# Patient Record
Sex: Female | Born: 1950 | Race: White | Hispanic: No | Marital: Single | Smoking: Former smoker
Health system: Southern US, Community
[De-identification: ages and names within clinical notes are randomized; demographics above are authoritative.]

## PROBLEM LIST (undated history)

## (undated) DIAGNOSIS — M069 Rheumatoid arthritis, unspecified: Secondary | ICD-10-CM

## (undated) DIAGNOSIS — L4050 Arthropathic psoriasis, unspecified: Secondary | ICD-10-CM

## (undated) DIAGNOSIS — D649 Anemia, unspecified: Secondary | ICD-10-CM

## (undated) DIAGNOSIS — F319 Bipolar disorder, unspecified: Secondary | ICD-10-CM

## (undated) DIAGNOSIS — G2401 Drug induced subacute dyskinesia: Secondary | ICD-10-CM

## (undated) DIAGNOSIS — I10 Essential (primary) hypertension: Secondary | ICD-10-CM

## (undated) DIAGNOSIS — D1803 Hemangioma of intra-abdominal structures: Secondary | ICD-10-CM

## (undated) DIAGNOSIS — E119 Type 2 diabetes mellitus without complications: Secondary | ICD-10-CM

## (undated) DIAGNOSIS — Z972 Presence of dental prosthetic device (complete) (partial): Secondary | ICD-10-CM

## (undated) DIAGNOSIS — J45909 Unspecified asthma, uncomplicated: Secondary | ICD-10-CM

## (undated) DIAGNOSIS — R911 Solitary pulmonary nodule: Secondary | ICD-10-CM

## (undated) DIAGNOSIS — I214 Non-ST elevation (NSTEMI) myocardial infarction: Secondary | ICD-10-CM

## (undated) DIAGNOSIS — J449 Chronic obstructive pulmonary disease, unspecified: Secondary | ICD-10-CM

## (undated) DIAGNOSIS — K219 Gastro-esophageal reflux disease without esophagitis: Secondary | ICD-10-CM

## (undated) DIAGNOSIS — L409 Psoriasis, unspecified: Secondary | ICD-10-CM

## (undated) HISTORY — PX: CHOLECYSTECTOMY: SHX55

## (undated) HISTORY — DX: Gastro-esophageal reflux disease without esophagitis: K21.9

## (undated) HISTORY — DX: Psoriasis, unspecified: L40.9

## (undated) HISTORY — PX: THROAT SURGERY: SHX803

## (undated) HISTORY — PX: JOINT REPLACEMENT: SHX530

## (undated) HISTORY — PX: CATARACT EXTRACTION W/ INTRAOCULAR LENS  IMPLANT, BILATERAL: SHX1307

## (undated) HISTORY — DX: Arthropathic psoriasis, unspecified: L40.50

## (undated) HISTORY — PX: POLYPECTOMY: SHX149

## (undated) HISTORY — PX: NEUROPLASTY MAJOR NERVE: SUR682

---

## 2008-07-26 DIAGNOSIS — Z8619 Personal history of other infectious and parasitic diseases: Secondary | ICD-10-CM

## 2008-07-26 HISTORY — DX: Personal history of other infectious and parasitic diseases: Z86.19

## 2013-09-18 DIAGNOSIS — M47813 Spondylosis without myelopathy or radiculopathy, cervicothoracic region: Secondary | ICD-10-CM | POA: Insufficient documentation

## 2013-09-18 DIAGNOSIS — M542 Cervicalgia: Secondary | ICD-10-CM | POA: Insufficient documentation

## 2014-02-05 ENCOUNTER — Emergency Department: Payer: Self-pay | Admitting: Emergency Medicine

## 2014-02-21 DIAGNOSIS — E871 Hypo-osmolality and hyponatremia: Secondary | ICD-10-CM | POA: Insufficient documentation

## 2014-03-24 DIAGNOSIS — M4712 Other spondylosis with myelopathy, cervical region: Secondary | ICD-10-CM | POA: Insufficient documentation

## 2014-03-24 DIAGNOSIS — M5412 Radiculopathy, cervical region: Secondary | ICD-10-CM | POA: Insufficient documentation

## 2014-05-30 ENCOUNTER — Ambulatory Visit: Payer: Self-pay | Admitting: Family Medicine

## 2014-07-12 DIAGNOSIS — M47812 Spondylosis without myelopathy or radiculopathy, cervical region: Secondary | ICD-10-CM | POA: Insufficient documentation

## 2014-07-12 DIAGNOSIS — M4722 Other spondylosis with radiculopathy, cervical region: Secondary | ICD-10-CM | POA: Insufficient documentation

## 2014-08-06 DIAGNOSIS — M47812 Spondylosis without myelopathy or radiculopathy, cervical region: Secondary | ICD-10-CM | POA: Insufficient documentation

## 2014-09-19 DIAGNOSIS — G8929 Other chronic pain: Secondary | ICD-10-CM | POA: Insufficient documentation

## 2014-09-19 DIAGNOSIS — R1319 Other dysphagia: Secondary | ICD-10-CM | POA: Insufficient documentation

## 2014-09-19 DIAGNOSIS — R1011 Right upper quadrant pain: Secondary | ICD-10-CM

## 2014-09-19 DIAGNOSIS — D1803 Hemangioma of intra-abdominal structures: Secondary | ICD-10-CM | POA: Insufficient documentation

## 2014-09-19 DIAGNOSIS — R1310 Dysphagia, unspecified: Secondary | ICD-10-CM | POA: Insufficient documentation

## 2014-10-01 DIAGNOSIS — G5602 Carpal tunnel syndrome, left upper limb: Secondary | ICD-10-CM | POA: Insufficient documentation

## 2014-11-21 ENCOUNTER — Ambulatory Visit
Admit: 2014-11-21 | Disposition: A | Discharge status: Home / Self Care | Payer: Self-pay | Attending: Gastroenterology | Admitting: Gastroenterology

## 2014-12-18 ENCOUNTER — Emergency Department
Admission: EM | Admit: 2014-12-18 | Discharge: 2014-12-19 | Disposition: A | Discharge status: Home / Self Care | Payer: Medicaid Other | Attending: Student | Admitting: Student

## 2014-12-18 DIAGNOSIS — F319 Bipolar disorder, unspecified: Secondary | ICD-10-CM | POA: Diagnosis not present

## 2014-12-18 DIAGNOSIS — M542 Cervicalgia: Secondary | ICD-10-CM | POA: Diagnosis not present

## 2014-12-18 DIAGNOSIS — I10 Essential (primary) hypertension: Secondary | ICD-10-CM | POA: Diagnosis not present

## 2014-12-18 DIAGNOSIS — F99 Mental disorder, not otherwise specified: Secondary | ICD-10-CM | POA: Diagnosis present

## 2014-12-18 DIAGNOSIS — F3132 Bipolar disorder, current episode depressed, moderate: Secondary | ICD-10-CM

## 2014-12-18 DIAGNOSIS — G8929 Other chronic pain: Secondary | ICD-10-CM | POA: Diagnosis not present

## 2014-12-18 DIAGNOSIS — Z79899 Other long term (current) drug therapy: Secondary | ICD-10-CM | POA: Insufficient documentation

## 2014-12-18 HISTORY — DX: Essential (primary) hypertension: I10

## 2014-12-18 HISTORY — DX: Drug induced subacute dyskinesia: G24.01

## 2014-12-18 HISTORY — DX: Solitary pulmonary nodule: R91.1

## 2014-12-18 HISTORY — DX: Hemangioma of intra-abdominal structures: D18.03

## 2014-12-18 HISTORY — DX: Bipolar disorder, unspecified: F31.9

## 2014-12-18 HISTORY — DX: Rheumatoid arthritis, unspecified: M06.9

## 2014-12-18 LAB — SALICYLATE LEVEL: Salicylate Lvl: <4 mg/dL (ref 2.8–30.0)

## 2014-12-18 LAB — URINE DRUG SCREEN, QUALITATIVE (ARMC ONLY)
Amphetamines, Ur Screen: NOT DETECTED
BARBITURATES, UR SCREEN: NOT DETECTED
BENZODIAZEPINE, UR SCRN: POSITIVE — AB
CANNABINOID 50 NG, UR ~~LOC~~: NOT DETECTED
COCAINE METABOLITE, UR ~~LOC~~: NOT DETECTED
MDMA (Ecstasy)Ur Screen: NOT DETECTED
METHADONE SCREEN, URINE: NOT DETECTED
Opiate, Ur Screen: NOT DETECTED
Phencyclidine (PCP) Ur S: NOT DETECTED
Tricyclic, Ur Screen: NOT DETECTED

## 2014-12-18 LAB — COMPREHENSIVE METABOLIC PANEL
ALT: 12 U/L — ABNORMAL LOW (ref 14–54)
AST: 21 U/L (ref 15–41)
Albumin: 4.1 g/dL (ref 3.5–5.0)
Alkaline Phosphatase: 76 U/L (ref 38–126)
Anion gap: 8 (ref 5–15)
BUN: 9 mg/dL (ref 6–20)
CHLORIDE: 100 mmol/L — AB (ref 101–111)
CO2: 26 mmol/L (ref 22–32)
CREATININE: 0.79 mg/dL (ref 0.44–1.00)
Calcium: 8.9 mg/dL (ref 8.9–10.3)
GFR calc non Af Amer: >60 mL/min (ref >60)
GLUCOSE: 97 mg/dL (ref 65–99)
Potassium: 4.1 mmol/L (ref 3.5–5.1)
SODIUM: 134 mmol/L — AB (ref 135–145)
TOTAL PROTEIN: 7.4 g/dL (ref 6.5–8.1)
Total Bilirubin: 0.3 mg/dL (ref 0.3–1.2)

## 2014-12-18 LAB — ETHANOL

## 2014-12-18 LAB — CBC
HCT: 38.3 % (ref 35.0–47.0)
Hemoglobin: 12.9 g/dL (ref 12.0–16.0)
MCH: 30 pg (ref 26.0–34.0)
MCHC: 33.7 g/dL (ref 32.0–36.0)
MCV: 89 fL (ref 80.0–100.0)
Platelets: 241 10*3/uL (ref 150–440)
RBC: 4.3 MIL/uL (ref 3.80–5.20)
RDW: 12.7 % (ref 11.5–14.5)
WBC: 9.1 10*3/uL (ref 3.6–11.0)

## 2014-12-18 LAB — ACETAMINOPHEN LEVEL

## 2014-12-18 MED ORDER — NICOTINE 10 MG IN INHA
RESPIRATORY_TRACT | Status: AC
  Administered 2014-12-18: 1 via RESPIRATORY_TRACT
  Filled 2014-12-18: qty 36

## 2014-12-18 MED ORDER — MIRTAZAPINE 15 MG PO TABS
7.5000 mg | ORAL_TABLET | Freq: Every day | ORAL | Status: DC
  Administered 2014-12-18: 7.5 mg via ORAL

## 2014-12-18 MED ORDER — GABAPENTIN 300 MG PO CAPS
ORAL_CAPSULE | ORAL | Status: AC
  Administered 2014-12-18: 300 mg
  Filled 2014-12-18: qty 1

## 2014-12-18 MED ORDER — TRAZODONE HCL 100 MG PO TABS: 100.0000 mg | ORAL_TABLET | Freq: Every day | ORAL | Status: DC

## 2014-12-18 MED ORDER — TRAMADOL HCL 50 MG PO TABS
ORAL_TABLET | ORAL | Status: AC
  Filled 2014-12-18: qty 1

## 2014-12-18 MED ORDER — NICOTINE 10 MG IN INHA
1.0000 | RESPIRATORY_TRACT | Status: DC | PRN
  Administered 2014-12-18 – 2014-12-19 (×2): 1 via RESPIRATORY_TRACT

## 2014-12-18 MED ORDER — TRAZODONE HCL 100 MG PO TABS
ORAL_TABLET | ORAL | Status: AC
Start: 2014-12-18 — End: 2014-12-18
  Administered 2014-12-18: 100 mg
  Filled 2014-12-18: qty 1

## 2014-12-18 MED ORDER — MIRTAZAPINE 15 MG PO TABS
ORAL_TABLET | ORAL | Status: AC
  Filled 2014-12-18: qty 1

## 2014-12-18 MED ORDER — GABAPENTIN 300 MG PO CAPS: 300.0000 mg | ORAL_CAPSULE | Freq: Every day | ORAL | Status: DC

## 2014-12-18 MED ORDER — TRAMADOL HCL 50 MG PO TABS
50.0000 mg | ORAL_TABLET | Freq: Once | ORAL | Status: AC
  Administered 2014-12-18: 50 mg via ORAL

## 2014-12-18 NOTE — ED Notes (Signed)

## 2014-12-18 NOTE — ED Notes (Signed)

## 2014-12-18 NOTE — ED Notes (Signed)
BEHAVIORAL HEALTH ROUNDING Patient sleeping: Yes.   Patient alert and oriented: not applicable Behavior appropriate: Yes.  ; If no, describe:  Nutrition and fluids offered: Yes  Toileting and hygiene offered: Yes  Sitter present: yes Law enforcement present: Yes ODS 

## 2014-12-18 NOTE — ED Notes (Signed)
Meds given per md order

## 2014-12-18 NOTE — ED Provider Notes (Signed)
[facility] Emergency Department Provider Note  ____________________________________________  Time seen: Approximately 3:09 PM  I have reviewed the triage vital signs and the nursing notes.   HISTORY  Chief Complaint Mental Health Problem    HPI Kathryn Ramirez is a 64 y.o. female with history of bipolar disorder, rheumatoid arthritis, hypertension, chronic neck pain presents for evaluation for depression, emotional lability. She reports it this is been going on for a few days but got worse today in the setting of a verbal disagreement with her daughter. She denies any suicidal or homicidal ideation, no audio visual hallucinations. Timing is intermittent. Current severity is moderate. She reports compliance with all her medications. She denies any recent illness. No modifying factors. No acute medical complaints.   Past Medical History  Diagnosis Date  . Bipolar 1 disorder   . Tardive dyskinesia   . Rheumatoid arthritis   . Hypertension   . Lung nodule   . Liver hemangioma     There are no active problems to display for this patient.   History reviewed. No pertinent past surgical history.  Current Outpatient Rx  Name  Route  Sig  Dispense  Refill  . ARIPiprazole (ABILIFY) 5 MG tablet   Oral   Take 5 mg by mouth daily.         [name] buPROPion (WELLBUTRIN XL) 150 MG 24 hr tablet   Oral   Take 150 mg by mouth daily.         . diazepam (VALIUM) 5 MG tablet   Oral   Take 2.5 mg by mouth 2 (two) times daily as needed for anxiety.         . gabapentin (NEURONTIN) 300 MG capsule   Oral   Take 300 mg by mouth 2 (two) times daily.         . mirtazapine (REMERON) 7.5 MG tablet   Oral   Take 7.5 mg by mouth at bedtime. Pt is supposed to take 1/2 tablet, but she took 1 tablet (7.5 mg) because pill is too small to cut.         . Multiple Vitamins-Minerals (MULTIVITAMIN ADULT PO)   Oral   Take 1 tablet by mouth daily.         .  pantoprazole (PROTONIX) 40 MG tablet   Oral   Take 40 mg by mouth daily.         . traMADol (ULTRAM) 50 MG tablet   Oral   Take 50 mg by mouth 2 (two) times daily.         . traZODone (DESYREL) 50 MG tablet   Oral   Take 50-150 mg by mouth at bedtime.            Allergies Haldol and Trileptal  History reviewed. No pertinent family history.  Social History History  Substance Use Topics  . Smoking status: Never Smoker   . Smokeless tobacco: Not on file  . Alcohol Use: No    Review of Systems Constitutional: No fever/chills Eyes: No visual changes. ENT: No sore throat. Cardiovascular: Denies chest pain. Respiratory: Denies shortness of breath. Gastrointestinal: No abdominal pain.  No nausea, no vomiting.  No diarrhea.  No constipation. Genitourinary: Negative for dysuria. Musculoskeletal: Negative for back pain. Skin: Negative for rash. Neurological: Negative for headaches, focal weakness or numbness.  10-point ROS otherwise negative.  ____________________________________________   PHYSICAL EXAM:  VITAL SIGNS: ED Triage Vitals  Enc Vitals Group     BP 12/18/14  1447 119/78 mmHg     Pulse Rate 12/18/14 1447 88     Resp 12/18/14 1447 18     Temp 12/18/14 1447 98.2 F (36.8 C)     Temp Source 12/18/14 1447 Oral     SpO2 12/18/14 1447 97 %     Weight 12/18/14 1447 150 lb (68.04 kg)     Height 12/18/14 1447 5\' 3"  (1.6 m)     Head Cir --      Peak Flow --      Pain Score --      Pain Loc --      Pain Edu? --      Excl. in [place]? --     Constitutional: Alert and oriented. Well appearing and in no acute distress. Eyes: Conjunctivae are normal. EOMI. Head: Atraumatic. Nose: No congestion/rhinnorhea. Mouth/Throat: Mucous membranes are moist.  Oropharynx non-erythematous. Neck: No stridor.  Cardiovascular: Normal rate, regular rhythm. Grossly normal heart sounds.  Good peripheral circulation. Respiratory: Normal respiratory effort.  No retractions. Lungs  CTAB. Gastrointestinal: Soft and nontender. No distention. No abdominal bruits. No CVA tenderness. Genitourinary: deferred Musculoskeletal: No lower extremity tenderness nor edema.  No joint effusions. Neurologic:  Normal speech and language. No gross focal neurologic deficits are appreciated. Speech is normal. No gait instability. Skin:  Skin is warm, dry and intact. No rash noted. Psychiatric: Mood is slightly depressed and she is tearful at times, normal affect, Speech and behavior are normal.  ____________________________________________   LABS (all labs ordered are listed, but only abnormal results are displayed)  Labs Reviewed  ACETAMINOPHEN LEVEL - Abnormal; Notable for the following:    Acetaminophen (Tylenol), Serum <10 (*)    All other components within normal limits  COMPREHENSIVE METABOLIC PANEL - Abnormal; Notable for the following:    Sodium 134 (*)    Chloride 100 (*)    ALT 12 (*)    All other components within normal limits  URINE DRUG SCREEN, QUALITATIVE ([facility]) - Abnormal; Notable for the following:    Benzodiazepine, Ur Scrn POSITIVE (*)    All other components within normal limits  CBC  ETHANOL  SALICYLATE LEVEL   ____________________________________________  EKG  none ____________________________________________  RADIOLOGY  none ____________________________________________   PROCEDURES  Procedure(s) performed: None  Critical Care performed: No  ____________________________________________   INITIAL IMPRESSION / ASSESSMENT AND PLAN / ED COURSE  Pertinent labs & imaging results that were available during my care of the patient were reviewed by me and considered in my medical decision making (see chart for details).  Kathryn Ramirez is a 64 y.o. female with history of bipolar disorder, rheumatoid arthritis, hypertension, chronic neck pain presents for evaluation for depression, emotional lability. No acute medical complaints. Vital signs  stable. Afebrile. Screening labs generally unremarkable. Medically cleared. No indication for involuntary commitment. Will consult behavior health and psychiatry.  ----------------------------------------- 10:33 PM on 12/18/2014 -----------------------------------------  Labs reviewed and generally unremarkable. Medically cleared. ____________________________________________   FINAL CLINICAL IMPRESSION(S) / ED DIAGNOSES  Final diagnoses:  Bipolar 1 disorder      Joanne Gavel, MD 12/18/14 2233

## 2014-12-18 NOTE — ED Notes (Signed)
BEHAVIORAL HEALTH ROUNDING Patient sleeping: No. Patient alert and oriented: yes Behavior appropriate: Yes.  ; If no, describe:  Nutrition and fluids offered: No Toileting and hygiene offered: Yes  Sitter present: yes Law enforcement present: Yes ODS

## 2014-12-18 NOTE — ED Notes (Addendum)
Pt alert and talkative at this time. Pt ate snack provided by staff. No distress noted and pt appears calm and cooperative. Magazine features editor nearby for pt and Dietitian.

## 2014-12-18 NOTE — ED Notes (Signed)
Report received from Larry Sierras, care assumed.   Pt calm and cooperative at this time, co chronic back pain, rounder doing 15 q15 min rounds, will continue to monitor.

## 2014-12-18 NOTE — ED Notes (Signed)

## 2014-12-18 NOTE — ED Notes (Addendum)
Per daughter pt has hx of psychiatric illness. Per daughter "pt is paranoid, disorganized, and that she isn't wanted in the house". Daughter states marked decline in pt ability to take care of herself. Paranoia that no one likes her, and everyone is against her. Daughter states patient called an apartment complex (where she had been on the waiting list for almost 2 years) and told them to take her off the waiting list because she feels like they don't like her. Daughter states that "she will minimize and present well then decline".

## 2014-12-18 NOTE — ED Notes (Signed)
BEHAVIORAL HEALTH ROUNDING Patient sleeping: No. Patient alert and oriented: yes Behavior appropriate: Yes.  ; If no, describe:  Nutrition and fluids offered: Yes  Toileting and hygiene offered: Yes  Sitter present: yes Law enforcement present: Yes  

## 2014-12-18 NOTE — ED Notes (Signed)
ED BHU [place] Is the patient under IVC or is there intent for IVC: No Is the patient medically cleared: Yes.   Is there vacancy in the ED BHU: Yes.   Is the population mix appropriate for patient: Yes.   Is the patient awaiting placement in inpatient or outpatient setting: Yes.   Has the patient had a psychiatric consult: No. Survey of unit performed for contraband, proper placement and condition of furniture, tampering with fixtures in bathroom, shower, and each patient room: Yes.  ; Findings:  APPEARANCE/BEHAVIOR calm, cooperative and adequate rapport can be established NEURO ASSESSMENT Orientation: time, place and person Hallucinations: No. Speech: Normal Gait: normal RESPIRATORY ASSESSMENT Normal expansion.  Clear to auscultation.  No rales, rhonchi, or wheezing. CARDIOVASCULAR ASSESSMENT regular rate and rhythm, S1, S2 normal, no murmur, click, rub or gallop GASTROINTESTINAL ASSESSMENT soft, nontender, BS WNL, no r/g EXTREMITIES normal strength, tone, and muscle mass PLAN OF CARE Provide calm/safe environment. Vital signs assessed twice daily. ED BHU Assessment once each 12-hour shift. Collaborate with intake RN daily or as condition indicates. Assure the ED provider has rounded once each shift. Provide and encourage hygiene. Provide redirection as needed. Assess for escalating behavior; address immediately and inform ED provider.  Assess family dynamic and appropriateness for visitation as needed: Yes.  ; If necessary, describe findings:  Educate the patient/family about BHU procedures/visitation: Yes.  ; If necessary, describe findings:

## 2014-12-18 NOTE — ED Notes (Signed)
Pt states that she is living with daughter currently and that she "kinda flipped out today" because she is tired and "fed up" with cleaning the house, walking the dogs. Pt alert and oriented X4, active, cooperative, pt in NAD. RR even and unlabored, color WNL.  Pt is voluntary but states that daughter is "making me come here". Pt hx of bipolar disorder and is on medications currently.

## 2014-12-18 NOTE — ED Notes (Signed)
Kathryn Ramirez (daughter): [phone]

## 2014-12-19 DIAGNOSIS — G8929 Other chronic pain: Secondary | ICD-10-CM

## 2014-12-19 DIAGNOSIS — F3132 Bipolar disorder, current episode depressed, moderate: Secondary | ICD-10-CM

## 2014-12-19 NOTE — ED Notes (Signed)
Pt observed sitting in the dayroom in a recliner watching TV  Pt observed with no unusual behavior  Appropriate to stimulation  No verbalized needs or concerns at this time  NAD assessed  Continue to monitor

## 2014-12-19 NOTE — ED Notes (Signed)
BEHAVIORAL HEALTH ROUNDING Patient sleeping: No. Patient alert and oriented: yes Behavior appropriate: Yes.  ; If no, describe:  Nutrition and fluids offered: yes Toileting and hygiene offered: Yes  Sitter present: q15 minute observations and security camera monitoring Law enforcement present: Yes  ODS  

## 2014-12-19 NOTE — ED Notes (Signed)

## 2014-12-19 NOTE — ED Notes (Signed)

## 2014-12-19 NOTE — ED Notes (Signed)
Patient observed lying in bed with eyes closed  Even, unlabored respirations observed   NAD pt appears to be sleeping  I will continue to monitor along with every 15 minute visual observations and ongoing security camera monitoring    

## 2014-12-19 NOTE — Consult Note (Signed)
[facility] Consult   Reason for Consult:  Consult for this 64 year old woman with a history of mental illness who presented herself voluntarily to the emergency room Referring Physician:  malinda Patient Identification: Kathryn Ramirez MRN:  [id number] Principal Diagnosis: Bipolar disorder with moderate depression Diagnosis:   Patient Active Problem List   Diagnosis Date Noted  . Bipolar disorder with moderate depression [F31.32] 12/19/2014  . Chronic pain [G89.29] 12/19/2014    Total Time spent with patient: 1 hour  Subjective:   Kathryn Ramirez is a 64 y.o. female patient admitted with "I didn't have my medicine". Patient describes mild symptoms of depression but mostly situational frustration. Not currently under commitment.[name]  HPI:  Information obtained from the patient and chart. Patient states that she came here because she was frustrated with living with her daughter and son-in-law. She says she's been living there for several months. She says she moved in with an agreement that she would clean their house in exchange for being allowed to live there but now wants to get out and live on her own. She has various minor complaints about her living situation. Patient says her mood stays a little nervous and a little depressed. She denies that she's having any recent hallucinations. She says she sleeps reasonably well. Denies suicidal or homicidal ideation. Her current medication is gabapentin 300 mg twice a day Abilify 5 mg a day Valium 2.5 mg a day Protonix for reflux tramadol 50 mg twice a day trazodone 150 mg at night. He has not been drinking or using any illicit drugs. Denies that there was any violence or assault on anyone's part. Denies suicidal ideation. Seems to brought herself here to the emergency room just because she had a bit of frustration with living with her daughter.  Patient describes a long history of chronic mental illness. Has been given the  diagnosis of bipolar disorder. Has had several psychiatric hospitalizations in the past. Has had suicide attempts in the past. Used to get very paranoid says she never had hallucinations. Past medications include Trileptal lithium and Haldol.  Social history: Patient says she is living with her daughter and son-in-law. This is her closest relative. Patient evidently gets a check and feels like she would like to go live on her own. She is complaining that her daughter has not helped her with that. Doesn't seem to have a lot of other support.  Medical history: Gastric reflux and some chronic pain for which she takes the tramadol  Substance abuse history: Says she used to drink heavily but stopped drinking 2 years ago and has not relapsed. Denies any history of drug abuse.  Family history: She denies knowing of any family history of mental illness.   HPI Elements:   Quality:  Anxiety and depression. Severity:  Mild to moderate. Timing:  Recent with an exacerbation in the last few days. Duration:  Seem to be a chronic issue. Context:  Frustration with her living situation.  Past Medical History:  Past Medical History  Diagnosis Date  . Bipolar 1 disorder   . Tardive dyskinesia   . Rheumatoid arthritis   . Hypertension   . Lung nodule   . Liver hemangioma    History reviewed. No pertinent past surgical history. Family History: History reviewed. No pertinent family history. Social History:  History  Alcohol Use No     History  Drug Use Not on file    History   Social History  . Marital Status:  Single    Spouse Name: N/A  . Number of Children: N/A  . Years of Education: N/A   Social History Main Topics  . Smoking status: Never Smoker   . Smokeless tobacco: Not on file  . Alcohol Use: No  . Drug Use: Not on file  . Sexual Activity: Not on file   Other Topics Concern  . None   Social History Narrative  . None   Additional Social History:                           Allergies:   Allergies  Allergen Reactions  . Haldol [Haloperidol] Other (See Comments)    Reaction: Pt "felt like her neck was going to snap."  . Trileptal [Oxcarbazepine] Swelling    Labs:  Results for orders placed or performed during the hospital encounter of 12/18/14 (from the past 48 hour(s))  Acetaminophen level     Status: Abnormal   Collection Time: 12/18/14  2:59 PM  Result Value Ref Range   Acetaminophen (Tylenol), Serum <10 (L) 10 - 30 ug/mL    Comment:        THERAPEUTIC CONCENTRATIONS VARY SIGNIFICANTLY. A RANGE OF 10-30 ug/mL MAY BE AN EFFECTIVE CONCENTRATION FOR MANY PATIENTS. HOWEVER, SOME ARE BEST TREATED AT CONCENTRATIONS OUTSIDE THIS RANGE. ACETAMINOPHEN CONCENTRATIONS >150 ug/mL AT 4 HOURS AFTER INGESTION AND >50 ug/mL AT 12 HOURS AFTER INGESTION ARE OFTEN ASSOCIATED WITH TOXIC REACTIONS.   CBC     Status: None   Collection Time: 12/18/14  2:59 PM  Result Value Ref Range   WBC 9.1 3.6 - 11.0 K/uL   RBC 4.30 3.80 - 5.20 MIL/uL   Hemoglobin 12.9 12.0 - 16.0 g/dL   HCT 38.3 35.0 - 47.0 %   MCV 89.0 80.0 - 100.0 fL   MCH 30.0 26.0 - 34.0 pg   MCHC 33.7 32.0 - 36.0 g/dL   RDW 12.7 11.5 - 14.5 %   Platelets 241 150 - 440 K/uL  Comprehensive metabolic panel     Status: Abnormal   Collection Time: 12/18/14  2:59 PM  Result Value Ref Range   Sodium 134 (L) 135 - 145 mmol/L   Potassium 4.1 3.5 - 5.1 mmol/L   Chloride 100 (L) 101 - 111 mmol/L   CO2 26 22 - 32 mmol/L   Glucose, Bld 97 65 - 99 mg/dL   BUN 9 6 - 20 mg/dL   Creatinine, Ser 0.79 0.44 - 1.00 mg/dL   Calcium 8.9 8.9 - 10.3 mg/dL   Total Protein 7.4 6.5 - 8.1 g/dL   Albumin 4.1 3.5 - 5.0 g/dL   AST 21 15 - 41 U/L   ALT 12 (L) 14 - 54 U/L   Alkaline Phosphatase 76 38 - 126 U/L   Total Bilirubin 0.3 0.3 - 1.2 mg/dL   GFR calc non Af Amer >60 >60 mL/min   GFR calc Af Amer >60 >60 mL/min    Comment: (NOTE) The eGFR has been calculated using the CKD EPI equation. This calculation has  not been validated in all clinical situations. eGFR's persistently <60 mL/min signify possible Chronic Kidney Disease.    Anion gap 8 5 - 15  Ethanol (ETOH)     Status: None   Collection Time: 12/18/14  2:59 PM  Result Value Ref Range   Alcohol, Ethyl (B) <5 <5 mg/dL    Comment:        LOWEST DETECTABLE LIMIT FOR SERUM ALCOHOL  IS 11 mg/dL FOR MEDICAL PURPOSES ONLY   Salicylate level     Status: None   Collection Time: 12/18/14  2:59 PM  Result Value Ref Range   Salicylate Lvl <9.9 2.8 - 30.0 mg/dL  Urine Drug Screen, Qualitative [facility])     Status: Abnormal   Collection Time: 12/18/14  3:00 PM  Result Value Ref Range   Tricyclic, Ur Screen NONE DETECTED NONE DETECTED   Amphetamines, Ur Screen NONE DETECTED NONE DETECTED   MDMA (Ecstasy)Ur Screen NONE DETECTED NONE DETECTED   Cocaine Metabolite,Ur [place] NONE DETECTED NONE DETECTED   Opiate, Ur Screen NONE DETECTED NONE DETECTED   Phencyclidine (PCP) Ur S NONE DETECTED NONE DETECTED   Cannabinoid 50 Ng, Ur [place] NONE DETECTED NONE DETECTED   Barbiturates, Ur Screen NONE DETECTED NONE DETECTED   Benzodiazepine, Ur Scrn POSITIVE (A) NONE DETECTED   Methadone Scn, Ur NONE DETECTED NONE DETECTED    Comment: (NOTE) 833  Tricyclics, urine               Cutoff 1000 ng/mL 200  Amphetamines, urine             Cutoff 1000 ng/mL 300  MDMA (Ecstasy), urine           Cutoff 500 ng/mL 400  Cocaine Metabolite, urine       Cutoff 300 ng/mL 500  Opiate, urine                   Cutoff 300 ng/mL 600  Phencyclidine (PCP), urine      Cutoff 25 ng/mL 700  Cannabinoid, urine              Cutoff 50 ng/mL 800  Barbiturates, urine             Cutoff 200 ng/mL 900  Benzodiazepine, urine           Cutoff 200 ng/mL 1000 Methadone, urine                Cutoff 300 ng/mL 1100 1200 The urine drug screen provides only a preliminary, unconfirmed 1300 analytical test result and should not be used for non-medical 1400 purposes. Clinical consideration and professional  judgment should 1500 be applied to any positive drug screen result due to possible 1600 interfering substances. A more specific alternate chemical method 1700 must be used in order to obtain a confirmed analytical result.  1800 Gas chromato graphy / mass spectrometry (GC/MS) is the preferred 1900 confirmatory method.     Vitals: Blood pressure 127/84, pulse 82, temperature 98.2 F (36.8 C), temperature source Oral, resp. rate 18, height _0  (1.6 m), weight 68.04 kg (150 lb), SpO2 100 %.  Risk to Self: Suicidal Ideation: No Suicidal Intent: No Is patient at risk for suicide?: No Suicidal Plan?: No Access to Means: No What has been your use of drugs/alcohol within the last 12 months?: none How many times?: 0 Other Self Harm Risks: depressed and stressed Triggers for Past Attempts: None known Intentional Self Injurious Behavior: None Risk to Others: Homicidal Ideation: No Thoughts of Harm to Others: No Current Homicidal Intent: No Current Homicidal Plan: No Access to Homicidal Means: No Identified Victim: none History of harm to others?: No Assessment of Violence: On admission Does patient have access to weapons?: No Criminal Charges Pending?: No Does patient have a court date: No Prior Inpatient Therapy: Prior Inpatient Therapy: Yes Prior Therapy Dates: unknown Prior Therapy Facilty/Provider(s): in [place] and [place] Reason for Treatment:  depression/bipolar Prior Outpatient Therapy: Prior Outpatient Therapy: No Does patient have an ACCT team?: No Does patient have Intensive In-House Services?  : No Does patient have [facility] services? : No Does patient have P4CC services?: No  Current Facility-Administered Medications  Medication Dose Route Frequency Provider Last Rate Last Dose  . gabapentin (NEURONTIN) capsule 300 mg  300 mg Oral QHS Joanne Gavel, MD   0 mg at 12/19/14 0612  . mirtazapine (REMERON) tablet 7.5 mg  7.5 mg Oral QHS Joanne Gavel, MD   7.5 mg at  12/18/14 2347  . nicotine (NICOTROL) 10 MG inhaler 1 continuous puffing  1 continuous puffing Inhalation PRN Joanne Gavel, MD   1 continuous puffing at 12/19/14 1710  . traZODone (DESYREL) tablet 100 mg  100 mg Oral QHS Joanne Gavel, MD   0 mg at 12/19/14 2035   Current Outpatient Prescriptions  Medication Sig Dispense Refill  . ARIPiprazole (ABILIFY) 5 MG tablet Take 5 mg by mouth daily.    [name] buPROPion (WELLBUTRIN XL) 150 MG 24 hr tablet Take 150 mg by mouth daily.    . diazepam (VALIUM) 5 MG tablet Take 2.5 mg by mouth 2 (two) times daily as needed for anxiety.    . gabapentin (NEURONTIN) 300 MG capsule Take 300 mg by mouth 2 (two) times daily.    . mirtazapine (REMERON) 7.5 MG tablet Take 7.5 mg by mouth at bedtime. Pt is supposed to take 1/2 tablet, but she took 1 tablet (7.5 mg) because pill is too small to cut.    . Multiple Vitamins-Minerals (MULTIVITAMIN ADULT PO) Take 1 tablet by mouth daily.    . pantoprazole (PROTONIX) 40 MG tablet Take 40 mg by mouth daily.    . traMADol (ULTRAM) 50 MG tablet Take 50 mg by mouth 2 (two) times daily.    . traZODone (DESYREL) 50 MG tablet Take 50-150 mg by mouth at bedtime.       Musculoskeletal: Strength & Muscle Tone: within normal limits Gait & Station: normal Patient leans: N/A  Psychiatric Specialty Exam: Physical Exam  Constitutional: She appears well-developed and well-nourished.  HENT:  Head: Normocephalic and atraumatic.  Eyes: Conjunctivae are normal. Pupils are equal, round, and reactive to light.  Neck: Normal range of motion.  Cardiovascular: Normal heart sounds.   Respiratory: Effort normal.  GI: Soft.  Musculoskeletal: Normal range of motion.  Neurological: She is alert.  Skin: Skin is warm and dry.  Psychiatric: Thought content normal. Her mood appears anxious. Her speech is delayed. She is slowed. Cognition and memory are impaired. She expresses impulsivity.    Review of Systems  Constitutional: Negative.   HENT:  Negative.   Eyes: Negative.   Respiratory: Negative.   Cardiovascular: Negative.   Gastrointestinal: Negative.   Musculoskeletal: Negative.   Skin: Negative.   Neurological: Negative.   Psychiatric/Behavioral: Positive for depression. Negative for suicidal ideas, hallucinations, memory loss and substance abuse. The patient is nervous/anxious. The patient does not have insomnia.     Blood pressure 127/84, pulse 82, temperature 98.2 F (36.8 C), temperature source Oral, resp. rate 18, height _0  (1.6 m), weight 68.04 kg (150 lb), SpO2 100 %.Body mass index is 26.58 kg/(m^2).  General Appearance: Disheveled  Eye Contact::  Good  Speech:  Normal Rate  Volume:  Normal  Mood:  Dysphoric  Affect:  Congruent  Thought Process:  Loose and Tangential  Orientation:  Full (Time, Place, and Person)  Thought Content:  Negative  Suicidal  Thoughts:  No  Homicidal Thoughts:  No  Memory:  Immediate;   Good Recent;   Good Remote;   Good  Judgement:  Impaired  Insight:  Shallow  Psychomotor Activity:  Decreased  Concentration:  Fair  Recall:  [employer] of Knowledge:Fair  Language: Fair  Akathisia:  No  Handed:  Right  AIMS (if indicated):     Assets:  Communication Skills Desire for Improvement Financial Resources/Insurance Housing Social Support  ADL's:  Intact  Cognition: Impaired,  Mild  Sleep:      Medical Decision Making: Review of Psycho-Social Stressors (1), Review or order clinical lab tests (1), Discuss test with performing physician (1), Established Problem, Worsening (2), Review of Last Therapy Session (1) and Review of Medication Regimen & Side Effects (2)  Treatment Plan Summary: Plan This is 64 year old woman with a history of chronic mental illness. Possibly schizoaffective versus bipolar disorder. She presented because of some frustration with her living situation. She has outpatient psychiatric treatment in place and is seeing Dr. Chauncy Passy. Patient is not grossly  disorganized and she is not suicidal or homicidal. No indication for hospitalization. Patient does not have an involuntary commitment. Labs reviewed. Chart reviewed. Patient encouraged to follow up with her current medicine and her outpatient psychiatrist. She can be released from the emergency room.  Plan:  No evidence of imminent risk to self or others at present.   Patient does not meet criteria for psychiatric inpatient admission. Supportive therapy provided about ongoing stressors. Discussed crisis plan, support from social network, calling 911, coming to the Emergency Department, and calling Suicide Hotline. Disposition: As noted patient can be discharged once medically stable.  CLAPACS, JOHN 12/19/2014 6:01 PM

## 2014-12-19 NOTE — ED Notes (Signed)
Pt awaiting intake and psych consult   Assessment completed  Appropriate to stimulation  No verbalized needs or concerns at this time  NAD assessed  Continue to monitor

## 2014-12-19 NOTE — ED Notes (Signed)
Pt currently on the phone with her daughter   NAD observed continue to monitor

## 2014-12-19 NOTE — ED Notes (Signed)
Pt observed sitting in the chair of her room   Pt observed with no unusual behavior  Appropriate to stimulation  No verbalized needs or concerns at this time  NAD assessed  Continue to monitor

## 2014-12-19 NOTE — ED Notes (Signed)
efferdent provided   Pt to the shower

## 2014-12-19 NOTE — ED Notes (Signed)
Lunch provided   Pt reports that she has no where to go when she leaves the hospital and she would like CSW to find her a place to live Pt observed with no unusual behavior  Appropriate to stimulation    NAD assessed  Continue to monitor

## 2014-12-19 NOTE — ED Notes (Signed)
Clapacs in to consult at this time

## 2014-12-19 NOTE — BH Assessment (Signed)
Assessment Note  Kathryn Ramirez is an 64 y.o. female, who presents to the ED stating, "my daughter put me here; I want to get my own place; my daughter and her husband like her dad better than me; [facility] about helping me; they didn't call me back; my daughter told me; "you're only here until you get your mental health together; she is very controlling; she took me off Facebook; she said, "old people don't belong on Facebook." "I just want to get my own place; I want to get away from her; they just want me around for what they can get; I don't want to hurt myself; I just want my own place."   Axis I: Major Depression, Recurrent severe Axis II: Deferred Axis III:  Past Medical History  Diagnosis Date  . Bipolar 1 disorder   . Tardive dyskinesia   . Rheumatoid arthritis   . Hypertension   . Lung nodule   . Liver hemangioma    Axis IV: housing problems, other psychosocial or environmental problems and problems with primary support group Axis V: 41-50 serious symptoms  Past Medical History:  Past Medical History  Diagnosis Date  . Bipolar 1 disorder   . Tardive dyskinesia   . Rheumatoid arthritis   . Hypertension   . Lung nodule   . Liver hemangioma     History reviewed. No pertinent past surgical history.  Family History: History reviewed. No pertinent family history.  Social History:  reports that she has never smoked. She does not have any smokeless tobacco history on file. She reports that she does not drink alcohol. Her drug history is not on file.  Additional Social History:     CIWA: CIWA-Ar BP: (!) 99/59 mmHg Pulse Rate: 88 COWS:    Allergies:  Allergies  Allergen Reactions  . Haldol [Haloperidol] Other (See Comments)    Reaction: Pt "felt like her neck was going to snap."  . Trileptal [Oxcarbazepine] Swelling    Home Medications:  (Not in a hospital admission)  OB/GYN Status:  No LMP recorded. Patient is postmenopausal.  General  Assessment Data Location of Assessment: [facility] ED TTS Assessment: In system Is this a Tele or Face-to-Face Assessment?: Face-to-Face Is this an Initial Assessment or a Re-assessment for this encounter?: Initial Assessment Marital status: Single Is patient pregnant?: No Pregnancy Status: No Living Arrangements: Children Can pt return to current living arrangement?: No ("I don't want to.") Admission Status: Voluntary Is patient capable of signing voluntary admission?: Yes Referral Source: Self/Family/Friend Insurance type: Medicaid  Medical Screening Exam ([place]) Medical Exam completed: Yes  Crisis Care Plan Living Arrangements: Children Name of Psychiatrist: none Name of Therapist: none  Education Status Is patient currently in school?: No Highest grade of school patient has completed: 10th  Risk to self with the past 6 months Suicidal Ideation: No Has patient been a risk to self within the past 6 months prior to admission? : No Suicidal Intent: No Has patient had any suicidal intent within the past 6 months prior to admission? : No Is patient at risk for suicide?: No Suicidal Plan?: No Has patient had any suicidal plan within the past 6 months prior to admission? : No Access to Means: No What has been your use of drugs/alcohol within the last 12 months?: none Previous Attempts/Gestures: No How many times?: 0 Other Self Harm Risks: depressed and stressed Triggers for Past Attempts: None known Intentional Self Injurious Behavior: None Family Suicide History: No  Recent stressful life event(s): Conflict (Comment) Persecutory voices/beliefs?: No Depression: Yes Depression Symptoms: Tearfulness, Loss of interest in usual pleasures Substance abuse history and/or treatment for substance abuse?: No Suicide prevention information given to non-admitted patients: Yes  Risk to Others within the past 6 months Homicidal Ideation: No Does patient have any lifetime risk of  violence toward others beyond the six months prior to admission? : No Thoughts of Harm to Others: No Current Homicidal Intent: No Current Homicidal Plan: No Access to Homicidal Means: No Identified Victim: none History of harm to others?: No Assessment of Violence: On admission Does patient have access to weapons?: No Criminal Charges Pending?: No Does patient have a court date: No Is patient on probation?: No  Psychosis Hallucinations: None noted Delusions: None noted  Mental Status Report Appearance/Hygiene: In scrubs Eye Contact: Fair Motor Activity: Unremarkable Speech: Soft, Slow Level of Consciousness: Quiet/awake Mood: Helpless Affect: Depressed Anxiety Level: Minimal Thought Processes: Circumstantial Judgement: Partial Orientation: Person, Place, Situation Obsessive Compulsive Thoughts/Behaviors: None  Cognitive Functioning Concentration: Fair Memory: Recent Intact Insight: Fair Impulse Control: Fair Appetite: Fair Sleep: No Change Vegetative Symptoms: None  ADLScreening [facility] Assessment Services) Patient's cognitive ability adequate to safely complete daily activities?: Yes Patient able to express need for assistance with ADLs?: Yes Independently performs ADLs?: Yes (appropriate for developmental age)  Prior Inpatient Therapy Prior Inpatient Therapy: Yes Prior Therapy Dates: unknown Prior Therapy Facilty/Provider(s): in [place] and [place] Reason for Treatment: depression/bipolar  Prior Outpatient Therapy Prior Outpatient Therapy: No Does patient have an ACCT team?: No Does patient have Intensive In-House Services?  : No Does patient have [facility] services? : No Does patient have P4CC services?: No  ADL Screening (condition at time of admission) Patient's cognitive ability adequate to safely complete daily activities?: Yes Patient able to express need for assistance with ADLs?: Yes Independently performs ADLs?: Yes (appropriate for developmental  age)       Abuse/Neglect Assessment (Assessment to be complete while patient is alone) Physical Abuse: Denies Verbal Abuse: Yes, present (Comment) Sexual Abuse: Denies Exploitation of patient/patient's resources: Denies Self-Neglect: Denies Values / Beliefs Cultural Requests During Hospitalization: None Spiritual Requests During Hospitalization: None Consults Spiritual Care Consult Needed: No Social Work Consult Needed: No Regulatory affairs officer (For Healthcare) Does patient have an advance directive?: No Would patient like information on creating an advanced directive?: Yes Higher education careers adviser given    Additional Information 1:1 In Past 12 Months?: No CIRT Risk: No Elopement Risk: No Does patient have medical clearance?: Yes  Child/Adolescent Assessment Running Away Risk: Denies Bed-Wetting: Denies Destruction of Property: Denies Cruelty to Animals: Denies Stealing: Denies Rebellious/Defies Authority: Denies Satanic Involvement: Denies Science writer: Denies Problems at [employer]: Denies Gang Involvement: Denies  Disposition:  Disposition Initial Assessment Completed for this Encounter: Yes Disposition of Patient: Referred to (psych MD to see) Patient referred to: Other (Comment) (psych MD to see)  On Site Evaluation by:   Reviewed with Physician:    Maris Berger 12/19/2014 10:08 AM

## 2014-12-19 NOTE — ED Notes (Signed)
Pt is sitting in a recliner in the dayroom  Pt observed with no unusual behavior  Appropriate to stimulation  No verbalized needs or concerns at this time  NAD assessed  Continue to monitor

## 2014-12-19 NOTE — ED Notes (Signed)
Pt laying in bed no distress noted  

## 2014-12-19 NOTE — ED Provider Notes (Signed)
-----------------------------------------   7:14 PM on 12/19/2014 -----------------------------------------   BP 127/84 mmHg  Pulse 82  Temp(Src) 98.2 F (36.8 C) (Oral)  Resp 18  Ht 5\' 3"  (1.6 m)  Wt 150 lb (68.04 kg)  BMI 26.58 kg/m2  SpO2 100%  Patient, and cooperative. Has been seen by psychiatrist, Dr. Weber Cooks, and has been deemed appropriate for discharge. Is not suicidal or homicidal. We'll follow-up with his known psychiatrist at [facility].     Orbie Pyo, MD 12/19/14 [phone]

## 2014-12-19 NOTE — ED Notes (Signed)
Supper has been provided   Pt is talking on the phone with her daughter   Daughter called and wanted to speak with the nurse   Daughter informed that pt privacy must be upheld and that I can direct her call to her mother at this time    Phone provided to the pt and i explained to the pt the importance of keeping her visit here private  Pt verbalized agreement

## 2014-12-19 NOTE — ED Notes (Signed)
ED BHU [place] Is the patient under IVC or is there intent for IVC: No. Is the patient medically cleared: Yes.   Is there vacancy in the ED BHU: No. Is the population mix appropriate for patient: Yes.   Is the patient awaiting placement in inpatient or outpatient setting: No. Has the patient had a psychiatric consult: No. Survey of unit performed for contraband, proper placement and condition of furniture, tampering with fixtures in bathroom, shower, and each patient room: Yes.  ; Findings:  APPEARANCE/BEHAVIOR Calm and cooperative NEURO ASSESSMENT Orientation:  Oriented x3 Hallucinations: No.None noted (Hallucinations) Speech: Normal Gait: normal RESPIRATORY ASSESSMENT Even  unlabored respirations noted  CARDIOVASCULAR ASSESSMENT Regular rate  Pulses equal  Skin warm and dry   GASTROINTESTINAL ASSESSMENT no GI complaint EXTREMITIES Full ROM  PLAN OF CARE Provide calm/safe environment. Vital signs assessed twice daily. ED BHU Assessment once each 12-hour shift. Collaborate with intake RN daily or as condition indicates. Assure the ED provider has rounded once each shift. Provide and encourage hygiene. Provide redirection as needed. Assess for escalating behavior; address immediately and inform ED provider.  Assess family dynamic and appropriateness for visitation as needed: Yes.  ; If necessary, describe findings:  Educate the patient/family about BHU procedures/visitation: Yes.  ; If necessary, describe findings:

## 2014-12-19 NOTE — ED Notes (Signed)
Report received from [facility]. Pt. Sleeping, respirations even and unlabored.  Will continue to monitor for safety via security cameras and Q 15 minute checks.

## 2014-12-19 NOTE — ED Notes (Signed)
VOL/ Consult complete/Pending Disposition

## 2014-12-19 NOTE — ED Notes (Signed)
Breakfast provided    Pt sitting up in bed  NAD observed  Continue to monitor

## 2014-12-19 NOTE — Discharge Instructions (Signed)

## 2014-12-19 NOTE — ED Notes (Signed)
Patient observed with no unusual behavior or acute distress. Patient with no verbalized needs or c/o at this time.... will continue to monitor and follow up as needed. Security staff monitoring patient on [employer] system.  

## 2014-12-19 NOTE — ED Provider Notes (Signed)
-----------------------------------------   7:16 AM on 12/19/2014 -----------------------------------------   BP 99/59 mmHg  Pulse 88  Temp(Src) 98.2 F (36.8 C) (Oral)  Resp 18  Ht 5\' 3"  (1.6 m)  Wt 150 lb (68.04 kg)  BMI 26.58 kg/m2  SpO2 95%  The patient had no acute events since last update.  Calm and cooperative at this time.  Disposition is pending per Psychiatry/Behavioral Medicine team recommendations. Will watch BP-this one done during rest period    Nena Polio, MD 12/19/14 [phone]

## 2015-05-25 ENCOUNTER — Emergency Department
Admission: EM | Admit: 2015-05-25 | Discharge: 2015-05-25 | Disposition: A | Discharge status: Home / Self Care | Payer: Medicaid Other | Attending: Emergency Medicine | Admitting: Emergency Medicine

## 2015-05-25 ENCOUNTER — Encounter: Payer: Self-pay | Admitting: Emergency Medicine

## 2015-05-25 DIAGNOSIS — M25561 Pain in right knee: Secondary | ICD-10-CM | POA: Diagnosis present

## 2015-05-25 DIAGNOSIS — M13161 Monoarthritis, not elsewhere classified, right knee: Secondary | ICD-10-CM

## 2015-05-25 DIAGNOSIS — M1711 Unilateral primary osteoarthritis, right knee: Secondary | ICD-10-CM | POA: Insufficient documentation

## 2015-05-25 DIAGNOSIS — I10 Essential (primary) hypertension: Secondary | ICD-10-CM | POA: Insufficient documentation

## 2015-05-25 DIAGNOSIS — Z79899 Other long term (current) drug therapy: Secondary | ICD-10-CM | POA: Diagnosis not present

## 2015-05-25 MED ORDER — OXYCODONE-ACETAMINOPHEN 5-325 MG PO TABS: 1.0000 | ORAL_TABLET | Freq: Every evening | ORAL | Status: DC | PRN

## 2015-05-25 MED ORDER — PREDNISONE 10 MG PO TABS: 10.0000 mg | ORAL_TABLET | ORAL | Status: DC

## 2015-05-25 NOTE — ED Provider Notes (Signed)
[facility] Emergency Department Provider Note  ____________________________________________  Time seen: Approximately 12:34 PM  I have reviewed the triage vital signs and the nursing notes.   HISTORY  Chief Complaint Knee Pain    HPI Kathryn Ramirez is a 64 y.o. female who presents to emergency department complaining of right knee pain.She states that she has sciatica in right leg from a known lumbar spinal issue. Ever, she states that this is "not related to my normal sciatica." That she has noticed her right knee is slightly "swollen." She states that the pain has been present for 3 days, it is constant, worse with movement and weightbearing, and is described as sharp. Eyes any injury precipitating this pain. His any overuse of same.   Past Medical History  Diagnosis Date  . Bipolar 1 disorder ([place])   . Tardive dyskinesia   . Rheumatoid arthritis ([place])   . Hypertension   . Lung nodule   . Liver hemangioma     Patient Active Problem List   Diagnosis Date Noted  . Bipolar disorder with moderate depression ([place]) 12/19/2014  . Chronic pain 12/19/2014    History reviewed. No pertinent past surgical history.  Current Outpatient Rx  Name  Route  Sig  Dispense  Refill  . ARIPiprazole (ABILIFY) 5 MG tablet   Oral   Take 5 mg by mouth daily.         [name] buPROPion (WELLBUTRIN XL) 150 MG 24 hr tablet   Oral   Take 150 mg by mouth daily.         . diazepam (VALIUM) 5 MG tablet   Oral   Take 2.5 mg by mouth 2 (two) times daily as needed for anxiety.         . gabapentin (NEURONTIN) 300 MG capsule   Oral   Take 300 mg by mouth 2 (two) times daily.         . mirtazapine (REMERON) 7.5 MG tablet   Oral   Take 7.5 mg by mouth at bedtime. Pt is supposed to take 1/2 tablet, but she took 1 tablet (7.5 mg) because pill is too small to cut.         . Multiple Vitamins-Minerals (MULTIVITAMIN ADULT PO)   Oral   Take 1 tablet by mouth daily.        . pantoprazole (PROTONIX) 40 MG tablet   Oral   Take 40 mg by mouth daily.         . predniSONE (DELTASONE) 10 MG tablet   Oral   Take 1 tablet (10 mg total) by mouth as directed.   21 tablet   0     Take on a daily basis of 6, 5, 4, 3, 2, 1   . traMADol (ULTRAM) 50 MG tablet   Oral   Take 50 mg by mouth 2 (two) times daily.         . traZODone (DESYREL) 50 MG tablet   Oral   Take 50-150 mg by mouth at bedtime.            Allergies Haldol and Trileptal  No family history on file.  Social History Social History  Substance Use Topics  . Smoking status: Never Smoker   . Smokeless tobacco: None  . Alcohol Use: No    Review of Systems Constitutional: No fever/chills Eyes: No visual changes. ENT: No sore throat. Cardiovascular: Denies chest pain. Respiratory: Denies shortness of breath. Gastrointestinal: No abdominal pain.  No  nausea, no vomiting.  No diarrhea.  No constipation. Genitourinary: Negative for dysuria. Musculoskeletal: Negative for back pain. Nurses right knee pain Skin: Negative for rash. Neurological: Negative for headaches, focal weakness or numbness.  10-point ROS otherwise negative.  ____________________________________________   PHYSICAL EXAM:  VITAL SIGNS: ED Triage Vitals  Enc Vitals Group     BP 05/25/15 1204 171/90 mmHg     Pulse Rate 05/25/15 1204 78     Resp 05/25/15 1204 18     Temp 05/25/15 1204 98.2 F (36.8 C)     Temp Source 05/25/15 1204 Oral     SpO2 05/25/15 1204 96 %     Weight 05/25/15 1204 175 lb (79.379 kg)     Height 05/25/15 1204 5\' 3"  (1.6 m)     Head Cir --      Peak Flow --      Pain Score 05/25/15 1211 8     Pain Loc --      Pain Edu? --      Excl. in [place]? --     Constitutional: Alert and oriented. Well appearing and in no acute distress. Eyes: Conjunctivae are normal. PERRL. EOMI. Head: Atraumatic. Nose: No congestion/rhinnorhea. Mouth/Throat: Mucous membranes are moist.  Oropharynx  non-erythematous. Neck: No stridor.   Cardiovascular: Normal rate, regular rhythm. Grossly normal heart sounds.  Good peripheral circulation. Respiratory: Normal respiratory effort.  No retractions. Lungs CTAB. Gastrointestinal: Soft and nontender. No distention. No abdominal bruits. No CVA tenderness. Musculoskeletal: Mildly edematous right knee when compared with the left. No visible deformity. No ecchymosis or contusion. She is diffusely tender to palpation over the knee with no point tenderness. Varus and valgus, Lachman's, anterior drawer are all negative.  No joint effusions. Neurologic:  Normal speech and language. No gross focal neurologic deficits are appreciated. No gait instability. Skin:  Skin is warm, dry and intact. No rash noted. Psychiatric: Mood and affect are normal. Speech and behavior are normal.  ____________________________________________   LABS (all labs ordered are listed, but only abnormal results are displayed)  Labs Reviewed - No data to display ____________________________________________  EKG   ____________________________________________  RADIOLOGY   ____________________________________________   PROCEDURES  Procedure(s) performed: None  Critical Care performed: No  ____________________________________________   INITIAL IMPRESSION / ASSESSMENT AND PLAN / ED COURSE  Pertinent labs & imaging results that were available during my care of the patient were reviewed by me and considered in my medical decision making (see chart for details).  Patient's history, symptoms, physical exam are consistent with inflammation to right knee. No signs of infection. No ballottement or effusion. I am not concerned for bony abnormality to include fracture or dislocation. I'll special tests were negative and no concern at this time for tendon or ligament damage. Advised the patient of findings and diagnosis and she verbalizes understanding of same. I advised her  that her symptoms are likely secondary to change in ambulation due to her lower back pain. The patient has a history of GI erosion I'll place the patient on a short prednisone taper for symptomatic control. I advised the patient to pick up a neoprene knee brace and to wear that for additional support and symptomatic control. The patient verbalizes understanding of diagnosis and she verbalizes understanding of treatment plan. She verbalizes compliance with same. I advised the patient that she should follow-up with orthopedics if symptoms persist for further evaluation and treatment. ____________________________________________   FINAL CLINICAL IMPRESSION(S) / ED DIAGNOSES  Final diagnoses:  Inflammation of  joint of knee, right      Darletta Moll, PA-C 05/25/15 1252  Delman Kitten, MD 05/25/15 [phone]

## 2015-05-25 NOTE — Discharge Instructions (Signed)

## 2015-05-25 NOTE — ED Notes (Signed)
Right knee with no obvious swelling. No redness noted.

## 2015-05-25 NOTE — ED Notes (Signed)
Pt reports right knee pain and swelling for past 3 days. Denies injury.

## 2015-07-02 ENCOUNTER — Encounter: Payer: Self-pay | Admitting: Family Medicine

## 2015-07-02 ENCOUNTER — Ambulatory Visit (INDEPENDENT_AMBULATORY_CARE_PROVIDER_SITE_OTHER): Payer: Medicaid Other | Admitting: Family Medicine

## 2015-07-02 VITALS — BP 150/88 | HR 83 | Temp 98.2°F | Resp 17 | Ht 63.0 in | Wt 180.3 lb

## 2015-07-02 DIAGNOSIS — M7121 Synovial cyst of popliteal space [Baker], right knee: Secondary | ICD-10-CM

## 2015-07-02 DIAGNOSIS — M25561 Pain in right knee: Secondary | ICD-10-CM | POA: Diagnosis not present

## 2015-07-02 NOTE — Progress Notes (Signed)
Name: Kathryn Ramirez   MRN: [id number]    DOB: [birth year]   Date:07/02/2015       Progress Note  Subjective  Chief Complaint  Chief Complaint  Patient presents with  . Establish Care    NP  . Manic Behavior   Knee Pain  There was no injury mechanism. The pain is present in the right knee. The quality of the pain is described as shooting (sharp pain when walking). The pain is at a severity of 9/10. The pain is severe. The pain has been fluctuating (worse with walking and weight bearing, better when laying down with two pillows underneath her knee) since onset. Pertinent negatives include no inability to bear weight, loss of motion, loss of sensation, numbness or tingling. She has tried immobilization for the symptoms. The treatment provided moderate relief.   patient reportedly has a history of Baker's cyst in the right kidney.  Pt. Is here to establish care. Last PCP Dr. Iona Beard in [facility] with [facility].   Past Medical History  Diagnosis Date  . Bipolar 1 disorder ([place])   . Tardive dyskinesia   . Rheumatoid arthritis ([place])   . Hypertension   . Lung nodule   . Liver hemangioma   . Acid reflux disease   . Psoriasis     Past Surgical History  Procedure Laterality Date  . Neuroplasty major nerve    . Polypectomy    . Throat surgery      Family History  Problem Relation Age of Onset  . Cancer Mother     Esophageal Ca  . Asthma Brother     Social History   Social History  . Marital Status: Single    Spouse Name: N/A  . Number of Children: N/A  . Years of Education: N/A   Occupational History  . Not on file.   Social History Main Topics  . Smoking status: Former Smoker    Types: Cigarettes    Quit date: 12/31/2014  . Smokeless tobacco: Never Used  . Alcohol Use: No     Comment: 03/31/2012 sobierty   . Drug Use: No  . Sexual Activity: No   Other Topics Concern  . Not on file   Social History Narrative     Current outpatient prescriptions:  .   ARIPiprazole (ABILIFY) 5 MG tablet, Take 5 mg by mouth daily., Disp: , Rfl:  .  buPROPion (WELLBUTRIN XL) 150 MG 24 hr tablet, Take 150 mg by mouth daily., Disp: , Rfl:  .  calcipotriene (DOVONOX) 0.005 % ointment, Apply topically., Disp: , Rfl:  .  diazepam (VALIUM) 5 MG tablet, Take 2.5 mg by mouth 2 (two) times daily as needed for anxiety., Disp: , Rfl:  .  gabapentin (NEURONTIN) 300 MG capsule, Take 300 mg by mouth 2 (two) times daily., Disp: , Rfl:  .  mirtazapine (REMERON) 7.5 MG tablet, Take 7.5 mg by mouth at bedtime. Pt is supposed to take 1/2 tablet, but she took 1 tablet (7.5 mg) because pill is too small to cut., Disp: , Rfl:  .  pantoprazole (PROTONIX) 40 MG tablet, Take 40 mg by mouth daily., Disp: , Rfl:  .  solifenacin (VESICARE) 10 MG tablet, Take by mouth., Disp: , Rfl:  .  traMADol (ULTRAM) 50 MG tablet, Take 50 mg by mouth 2 (two) times daily., Disp: , Rfl:  .  traZODone (DESYREL) 50 MG tablet, Take 50-150 mg by mouth at bedtime. , Disp: , Rfl:   Allergies  Allergen Reactions  . Codeine Nausea Only  . Haldol [Haloperidol] Other (See Comments)    Reaction: Pt "felt like her neck was going to snap."  . Haloperidol Lactate Other (See Comments)    Sever neck stiffness.  . Trileptal [Oxcarbazepine] Swelling    Review of Systems  Constitutional: Negative for fever and chills.  Musculoskeletal: Positive for joint pain.  Neurological: Negative for tingling and numbness.   Objective  Filed Vitals:   07/02/15 1541  BP: 150/88  Pulse: 83  Temp: 98.2 F (36.8 C)  TempSrc: Oral  Resp: 17  Height: _0  (1.6 m)  Weight: 180 lb 4.8 oz (81.784 kg)  SpO2: 83%    Physical Exam  Musculoskeletal:       Right knee: She exhibits swelling. She exhibits normal range of motion, no effusion and no erythema. Tenderness found. Patellar tendon tenderness noted.  Tenderness to palpation over the right knee supra-patellar area, normal ROM, knee is warm to touch, no erythema.   Nursing note and vitals reviewed.    Assessment & Plan  1. Knee pain, right Obtain laboratory and x-ray evaluation for possible causes of right knee pain including infections, inflammatory and degenerative disease. - CBC with Differential - Sed Rate (ESR) - Comprehensive Metabolic Panel (CMET) - DG Knee Complete 4 Views Right; Future  2. Baker's cyst of knee, right Referral to orthopedics for follow-up of Baker's cyst. - Ambulatory referral to Orthopedic Surgery    Khristi Schiller [facility] 07/02/2015 4:24 PM

## 2015-07-03 ENCOUNTER — Ambulatory Visit
Admission: RE | Admit: 2015-07-03 | Discharge: 2015-07-03 | Disposition: A | Discharge status: Home / Self Care | Payer: Medicaid Other | Source: Ambulatory Visit | Attending: Family Medicine | Admitting: Family Medicine

## 2015-07-03 DIAGNOSIS — M25461 Effusion, right knee: Secondary | ICD-10-CM | POA: Insufficient documentation

## 2015-07-03 DIAGNOSIS — M25561 Pain in right knee: Secondary | ICD-10-CM | POA: Insufficient documentation

## 2015-07-04 LAB — CBC WITH DIFFERENTIAL/PLATELET
BASOS ABS: 0 10*3/uL (ref 0.0–0.2)
Basos: 1 %
EOS (ABSOLUTE): 0.3 10*3/uL (ref 0.0–0.4)
Eos: 4 %
HEMOGLOBIN: 13.2 g/dL (ref 11.1–15.9)
Hematocrit: 38.9 % (ref 34.0–46.6)
Immature Grans (Abs): 0 10*3/uL (ref 0.0–0.1)
Immature Granulocytes: 0 %
LYMPHS ABS: 2.3 10*3/uL (ref 0.7–3.1)
Lymphs: 37 %
MCH: 28.8 pg (ref 26.6–33.0)
MCHC: 33.9 g/dL (ref 31.5–35.7)
MCV: 85 fL (ref 79–97)
MONOCYTES: 6 %
MONOS ABS: 0.4 10*3/uL (ref 0.1–0.9)
Neutrophils Absolute: 3.3 10*3/uL (ref 1.4–7.0)
Neutrophils: 52 %
Platelets: 345 10*3/uL (ref 150–379)
RBC: 4.59 x10E6/uL (ref 3.77–5.28)
RDW: 13.1 % (ref 12.3–15.4)
WBC: 6.4 10*3/uL (ref 3.4–10.8)

## 2015-07-04 LAB — COMPREHENSIVE METABOLIC PANEL
ALK PHOS: 82 IU/L (ref 39–117)
ALT: 19 IU/L (ref 0–32)
AST: 18 IU/L (ref 0–40)
Albumin/Globulin Ratio: 1.6 (ref 1.1–2.5)
Albumin: 4.4 g/dL (ref 3.6–4.8)
BUN/Creatinine Ratio: 10 — ABNORMAL LOW (ref 11–26)
BUN: 8 mg/dL (ref 8–27)
CHLORIDE: 95 mmol/L — AB (ref 97–106)
CO2: 26 mmol/L (ref 18–29)
Calcium: 9.5 mg/dL (ref 8.7–10.3)
Creatinine, Ser: 0.81 mg/dL (ref 0.57–1.00)
GFR calc Af Amer: 90 mL/min/{1.73_m2} (ref >59)
GFR calc non Af Amer: 78 mL/min/{1.73_m2} (ref >59)
GLUCOSE: 101 mg/dL — AB (ref 65–99)
Globulin, Total: 2.7 g/dL (ref 1.5–4.5)
Potassium: 5.1 mmol/L (ref 3.5–5.2)
Sodium: 136 mmol/L (ref 136–144)
Total Protein: 7.1 g/dL (ref 6.0–8.5)

## 2015-07-04 LAB — SEDIMENTATION RATE: SED RATE: 6 mm/h (ref 0–40)

## 2015-07-07 ENCOUNTER — Telehealth: Payer: Self-pay | Admitting: Family Medicine

## 2015-07-07 NOTE — Telephone Encounter (Signed)
Spoke with patient and she has been notified that her referral has been ordered and she will be contacted to schedule appointment

## 2015-07-07 NOTE — Telephone Encounter (Signed)
Pt would like a call back about her referral.

## 2015-07-10 ENCOUNTER — Telehealth: Payer: Self-pay | Admitting: Family Medicine

## 2015-07-10 NOTE — Telephone Encounter (Signed)
Patient had lab work and xrays last week. She is checking status on the results. Please return call [phone]

## 2015-07-11 NOTE — Telephone Encounter (Signed)
Results have been reported to patient 

## 2015-07-12 ENCOUNTER — Other Ambulatory Visit: Payer: Self-pay | Admitting: Family Medicine

## 2015-07-12 DIAGNOSIS — M25461 Effusion, right knee: Secondary | ICD-10-CM

## 2015-07-27 HISTORY — PX: CERVICAL SPINE SURGERY: SHX589

## 2015-07-31 ENCOUNTER — Encounter: Payer: Self-pay | Admitting: Gynecology

## 2015-07-31 ENCOUNTER — Ambulatory Visit: Payer: Medicaid Other

## 2015-07-31 ENCOUNTER — Ambulatory Visit
Admission: EM | Admit: 2015-07-31 | Discharge: 2015-07-31 | Disposition: A | Discharge status: Home / Self Care | Payer: Medicaid Other | Attending: Family Medicine | Admitting: Family Medicine

## 2015-07-31 DIAGNOSIS — R51 Headache: Secondary | ICD-10-CM | POA: Diagnosis not present

## 2015-07-31 DIAGNOSIS — J189 Pneumonia, unspecified organism: Secondary | ICD-10-CM | POA: Insufficient documentation

## 2015-07-31 DIAGNOSIS — R938 Abnormal findings on diagnostic imaging of other specified body structures: Secondary | ICD-10-CM

## 2015-07-31 DIAGNOSIS — R9389 Abnormal findings on diagnostic imaging of other specified body structures: Secondary | ICD-10-CM

## 2015-07-31 DIAGNOSIS — R918 Other nonspecific abnormal finding of lung field: Secondary | ICD-10-CM | POA: Diagnosis not present

## 2015-07-31 DIAGNOSIS — R509 Fever, unspecified: Secondary | ICD-10-CM | POA: Insufficient documentation

## 2015-07-31 LAB — URINALYSIS COMPLETE WITH MICROSCOPIC (ARMC ONLY)
Bilirubin Urine: NEGATIVE
Glucose, UA: NEGATIVE mg/dL
KETONES UR: NEGATIVE mg/dL
NITRITE: NEGATIVE
PH: 7 (ref 5.0–8.0)
PROTEIN: NEGATIVE mg/dL
Specific Gravity, Urine: 1.02 (ref 1.005–1.030)

## 2015-07-31 LAB — BASIC METABOLIC PANEL
ANION GAP: 5 (ref 5–15)
BUN: 13 mg/dL (ref 6–20)
CHLORIDE: 101 mmol/L (ref 101–111)
CO2: 30 mmol/L (ref 22–32)
Calcium: 9.1 mg/dL (ref 8.9–10.3)
Creatinine, Ser: 0.76 mg/dL (ref 0.44–1.00)
GFR calc Af Amer: >60 mL/min (ref >60)
GFR calc non Af Amer: >60 mL/min (ref >60)
GLUCOSE: 81 mg/dL (ref 65–99)
POTASSIUM: 4.2 mmol/L (ref 3.5–5.1)
SODIUM: 136 mmol/L (ref 135–145)

## 2015-07-31 LAB — CBC WITH DIFFERENTIAL/PLATELET
BASOS ABS: 0.1 10*3/uL (ref 0–0.1)
BASOS PCT: 1 %
Eosinophils Absolute: 0.4 10*3/uL (ref 0–0.7)
Eosinophils Relative: 5 %
HEMATOCRIT: 38.9 % (ref 35.0–47.0)
HEMOGLOBIN: 12.8 g/dL (ref 12.0–16.0)
LYMPHS PCT: 42 %
Lymphs Abs: 3.4 10*3/uL (ref 1.0–3.6)
MCH: 28.1 pg (ref 26.0–34.0)
MCHC: 32.8 g/dL (ref 32.0–36.0)
MCV: 85.7 fL (ref 80.0–100.0)
MONO ABS: 0.6 10*3/uL (ref 0.2–0.9)
MONOS PCT: 7 %
NEUTROS ABS: 3.7 10*3/uL (ref 1.4–6.5)
NEUTROS PCT: 45 %
Platelets: 266 10*3/uL (ref 150–440)
RBC: 4.54 MIL/uL (ref 3.80–5.20)
RDW: 13.7 % (ref 11.5–14.5)
WBC: 8.2 10*3/uL (ref 3.6–11.0)

## 2015-07-31 LAB — RAPID STREP SCREEN (MED CTR MEBANE ONLY): Streptococcus, Group A Screen (Direct): NEGATIVE

## 2015-07-31 LAB — SEDIMENTATION RATE: Sed Rate: 11 mm/hr (ref 0–30)

## 2015-07-31 MED ORDER — LEVOFLOXACIN 500 MG PO TABS: 500.0000 mg | ORAL_TABLET | Freq: Every day | ORAL | Status: DC

## 2015-07-31 NOTE — ED Notes (Signed)
Patient c/o low grade fever on and off with head ace x 3 week. Per patient fever 99-100.

## 2015-07-31 NOTE — Discharge Instructions (Signed)

## 2015-07-31 NOTE — ED Provider Notes (Signed)
CSN: [id number]     Arrival date & time 07/31/15  1542 History   First MD Initiated Contact with Patient 07/31/15 1846    Nurses notes were reviewed. Chief Complaint  Patient presents with  . Fever   Patient is here because for fever off and on for the last 3 weeks and often with fever she has a headache. She denies any other symptoms. She does have a history of rheumatoid arthritis she had a sedimentation rate first week of December which was negative. She also has a history of pulmonary nodules she's been followed at [facility] which is not new go back to taking it because of difficulty obtaining a ride. And arranging the discharge paperwork. She denies any type of cough and chest congestion burning on urination frequency chest pain or postnasal drainage. Even though she has pulmonary nodules she's not had a cough either. She states the headaches will babble and they do occur she had which initially got here but several hours later is now gone  He had a CT of her head MRI of her head about 2 years ago and they were negative for any lesions. She has a history of rheumatoid arthritis reflux hypertension and bipolar disease.  She is a former smoker. Mother had esophageal cancer. Brother has history of asthma. She did stop smoking in June 2016  (Consider location/radiation/quality/duration/timing/severity/associated sxs/prior Treatment) HPI  Past Medical History  Diagnosis Date  . Bipolar 1 disorder ([place])   . Tardive dyskinesia   . Rheumatoid arthritis ([place])   . Hypertension   . Lung nodule   . Liver hemangioma   . Acid reflux disease   . Psoriasis    Past Surgical History  Procedure Laterality Date  . Neuroplasty major nerve    . Polypectomy    . Throat surgery     Family History  Problem Relation Age of Onset  . Cancer Mother     Esophageal Ca  . Asthma Brother    Social History  Substance Use Topics  . Smoking status: Former Smoker    Types: Cigarettes    Quit date: 12/31/2014   . Smokeless tobacco: Never Used  . Alcohol Use: No     Comment: 03/31/2012 sobierty    OB History    No data available     Review of Systems  Constitutional: Positive for fever and fatigue.  HENT: Negative for drooling, ear discharge, ear pain, hearing loss and postnasal drip.   Eyes: Negative for pain and redness.  Respiratory: Negative for cough, chest tightness, shortness of breath and stridor.   Cardiovascular: Negative for chest pain.  Genitourinary: Negative for dysuria, urgency, hematuria and difficulty urinating.  Musculoskeletal: Positive for arthralgias and gait problem. Negative for myalgias.  Skin: Negative for rash.  Neurological: Positive for headaches. Negative for tremors and speech difficulty.  All other systems reviewed and are negative.   Allergies  Codeine; Haldol; Haloperidol lactate; and Trileptal  Home Medications   Prior to Admission medications   Medication Sig Start Date End Date Taking? Authorizing Provider  ARIPiprazole (ABILIFY) 5 MG tablet Take 5 mg by mouth daily.   Yes Historical Provider, MD  buPROPion (WELLBUTRIN XL) 150 MG 24 hr tablet Take 150 mg by mouth daily.   Yes Historical Provider, MD  calcipotriene (DOVONOX) 0.005 % ointment Apply topically. 02/18/15 02/18/16 Yes Historical Provider, MD  diazepam (VALIUM) 5 MG tablet Take 2.5 mg by mouth 2 (two) times daily as needed for anxiety.   Yes Historical  Provider, MD  gabapentin (NEURONTIN) 300 MG capsule Take 300 mg by mouth 2 (two) times daily.   Yes Historical Provider, MD  mirtazapine (REMERON) 7.5 MG tablet Take 7.5 mg by mouth at bedtime. Pt is supposed to take 1/2 tablet, but she took 1 tablet (7.5 mg) because pill is too small to cut.   Yes Historical Provider, MD  pantoprazole (PROTONIX) 40 MG tablet Take 40 mg by mouth daily.   Yes Historical Provider, MD  solifenacin (VESICARE) 10 MG tablet Take by mouth. 08/15/14 08/16/15 Yes Historical Provider, MD  traMADol (ULTRAM) 50 MG tablet Take  50 mg by mouth 2 (two) times daily.   Yes Historical Provider, MD  traZODone (DESYREL) 50 MG tablet Take 50-150 mg by mouth at bedtime.    Yes Historical Provider, MD  levofloxacin (LEVAQUIN) 500 MG tablet Take 1 tablet (500 mg total) by mouth daily. 07/31/15   Frederich Cha, MD   Meds Ordered and Administered this Visit  Medications - No data to display  BP 154/88 mmHg  Pulse 77  Temp(Src) 98 F (36.7 C) (Oral)  Resp 16  Ht 5\' 3"  (1.6 m)  Wt 180 lb (81.647 kg)  BMI 31.89 kg/m2  SpO2 98% No data found.   Physical Exam  Constitutional: She is oriented to person, place, and time. She appears well-developed and well-nourished.  HENT:  Head: Normocephalic and atraumatic.  Right Ear: Hearing and tympanic membrane normal.  Left Ear: Hearing and tympanic membrane normal.  Nose: Nose normal.  Mouth/Throat: She does not have dentures. No dental caries. Posterior oropharyngeal erythema present.  Eyes: Conjunctivae are normal. Pupils are equal, round, and reactive to light.  Neck: Neck supple.  Cardiovascular: Normal rate, regular rhythm and normal heart sounds.   Pulmonary/Chest: Effort normal and breath sounds normal. No respiratory distress. She has no wheezes.  Musculoskeletal: Normal range of motion. She exhibits no edema.  Lymphadenopathy:    She has cervical adenopathy.  Neurological: She is alert and oriented to person, place, and time. No cranial nerve deficit.  Skin: Skin is warm and dry. No erythema.  Psychiatric: Her speech is normal and behavior is normal. Her affect is blunt.  Vitals reviewed.   ED Course  Procedures (including critical care time)  Labs Review Labs Reviewed  URINALYSIS COMPLETEWITH MICROSCOPIC ([place]) - Abnormal; Notable for the following:    Hgb urine dipstick 1+ (*)    Leukocytes, UA TRACE (*)    Bacteria, UA RARE (*)    Squamous Epithelial / LPF 0-5 (*)    All other components within normal limits  RAPID STREP SCREEN (NOT AT [facility])  URINE  CULTURE  CULTURE, GROUP A STREP ([facility] ONLY)  CBC WITH DIFFERENTIAL/PLATELET  SEDIMENTATION RATE  BASIC METABOLIC PANEL    Imaging Review Dg Chest 2 View  07/31/2015  CLINICAL DATA:  Fever and headache for 3 weeks. History of hypertension. Former smoker. EXAM: CHEST  2 VIEW COMPARISON:  None. FINDINGS: Normal cardiac silhouette and mediastinal contours. Atherosclerotic plaque within thoracic aorta. Minimal left basilar heterogeneous opacities. No pleural effusion or pneumothorax. No evidence of. No acute osseous abnormalities. IMPRESSION: Left basilar opacities - while possibly representative of atelectasis or scar, in the absence of prior examinations, could be indicative of developing airspace opacity / pneumonia. A follow-up chest radiograph in 3 to 4 weeks after treatment is recommended to ensure resolution. Electronically Signed   By: Sandi Mariscal M.D.   On: 07/31/2015 19:50     Visual Acuity Review  Right Eye Distance:   Left Eye Distance:   Bilateral Distance:    Right Eye Near:   Left Eye Near:    Bilateral Near:     Results for orders placed or performed during the hospital encounter of 07/31/15  Rapid strep screen  Result Value Ref Range   Streptococcus, Group A Screen (Direct) NEGATIVE NEGATIVE  CBC with Differential  Result Value Ref Range   WBC 8.2 3.6 - 11.0 K/uL   RBC 4.54 3.80 - 5.20 MIL/uL   Hemoglobin 12.8 12.0 - 16.0 g/dL   HCT 38.9 35.0 - 47.0 %   MCV 85.7 80.0 - 100.0 fL   MCH 28.1 26.0 - 34.0 pg   MCHC 32.8 32.0 - 36.0 g/dL   RDW 13.7 11.5 - 14.5 %   Platelets 266 150 - 440 K/uL   Neutrophils Relative % 45 %   Neutro Abs 3.7 1.4 - 6.5 K/uL   Lymphocytes Relative 42 %   Lymphs Abs 3.4 1.0 - 3.6 K/uL   Monocytes Relative 7 %   Monocytes Absolute 0.6 0.2 - 0.9 K/uL   Eosinophils Relative 5 %   Eosinophils Absolute 0.4 0 - 0.7 K/uL   Basophils Relative 1 %   Basophils Absolute 0.1 0 - 0.1 K/uL  Sedimentation rate  Result Value Ref Range   Sed Rate 11 0  - 30 mm/hr  Basic metabolic panel  Result Value Ref Range   Sodium 136 135 - 145 mmol/L   Potassium 4.2 3.5 - 5.1 mmol/L   Chloride 101 101 - 111 mmol/L   CO2 30 22 - 32 mmol/L   Glucose, Bld 81 65 - 99 mg/dL   BUN 13 6 - 20 mg/dL   Creatinine, Ser 0.76 0.44 - 1.00 mg/dL   Calcium 9.1 8.9 - 10.3 mg/dL   GFR calc non Af Amer >60 >60 mL/min   GFR calc Af Amer >60 >60 mL/min   Anion gap 5 5 - 15  Urinalysis complete, with microscopic  Result Value Ref Range   Color, Urine YELLOW YELLOW   APPearance CLEAR CLEAR   Glucose, UA NEGATIVE NEGATIVE mg/dL   Bilirubin Urine NEGATIVE NEGATIVE   Ketones, ur NEGATIVE NEGATIVE mg/dL   Specific Gravity, Urine 1.020 1.005 - 1.030   Hgb urine dipstick 1+ (A) NEGATIVE   pH 7.0 5.0 - 8.0   Protein, ur NEGATIVE NEGATIVE mg/dL   Nitrite NEGATIVE NEGATIVE   Leukocytes, UA TRACE (A) NEGATIVE   RBC / HPF 6-30 0 - 5 RBC/hpf   WBC, UA 0-5 0 - 5 WBC/hpf   Bacteria, UA RARE (A) NONE SEEN   Squamous Epithelial / LPF 0-5 (A) NONE SEEN      MDM   1. Community acquired pneumonia   2. Abnormal CXR (chest x-ray)     Due to the recurrent fever that she's having an abnormal chest x-ray showing opacities versus nodularity will proceed to treat with Levaquin 500 mg 1 tablet day for 10 days and follow-up with her PCP if not better in 2 weeks. Probably need to have repeat chest x-ray if not CT scan of the lungs to further evaluate her lungs.    Frederich Cha, MD 07/31/15 2030

## 2015-08-02 LAB — URINE CULTURE: Special Requests: NORMAL

## 2015-08-02 LAB — CULTURE, GROUP A STREP (THRC)

## 2015-08-04 ENCOUNTER — Telehealth: Payer: Self-pay

## 2015-08-04 NOTE — ED Notes (Signed)
Patient contacted and given results of Urine and Throat culture. States "am still feeling bad" and will get PMD to obtain another clean catch urine when has upcoming appointment

## 2015-08-04 NOTE — ED Notes (Signed)
Spoke with patient regarding culture results. Will get another urine done if needed

## 2015-08-04 NOTE — ED Notes (Signed)
Patient contacted and informed of Urine Culture and throat culture results. States "Am still really sick with the cough". States will have PMD perform another Clean Catch Urine when has upcoming appointment

## 2015-08-14 ENCOUNTER — Ambulatory Visit (INDEPENDENT_AMBULATORY_CARE_PROVIDER_SITE_OTHER): Payer: Medicaid Other | Admitting: Family Medicine

## 2015-08-14 ENCOUNTER — Encounter: Payer: Self-pay | Admitting: Family Medicine

## 2015-08-14 VITALS — BP 140/86 | HR 89 | Temp 98.7°F | Resp 16 | Ht 63.0 in | Wt 185.1 lb

## 2015-08-14 DIAGNOSIS — J189 Pneumonia, unspecified organism: Secondary | ICD-10-CM | POA: Diagnosis not present

## 2015-08-14 DIAGNOSIS — R8290 Unspecified abnormal findings in urine: Secondary | ICD-10-CM | POA: Insufficient documentation

## 2015-08-14 NOTE — Progress Notes (Signed)
Name: Kathryn Ramirez   MRN: [id number]    DOB: [birth year]   Date:08/14/2015       Progress Note  Subjective  Chief Complaint  Chief Complaint  Patient presents with  . Pneumonia    follow up from urgent care on 1/5.  Pt states still having SOB and headache.    Pneumonia She complains of chest tightness, cough, difficulty breathing and shortness of breath. There is no sputum production or wheezing. This is a new problem. Episode onset: 1 month ago. Seen at the Urgent care 2 weeks ago, was started on Antibiotics. The cough is non-productive and dry. Associated symptoms include chest pain and a fever. Pertinent negatives include no sore throat. Relieved by: was started on Levaquin 500mg  once daily x 10 days, finished therapy 3 days ago.   Patient is here to also repeat her urine. Was obtained at the time of her visit to the Urgent Care on 07/31/15. It came back multiple species, suggesting recollection.No urinary symptoms at present except frequency.  Past Medical History  Diagnosis Date  . Bipolar 1 disorder ([place])   . Tardive dyskinesia   . Rheumatoid arthritis ([place])   . Hypertension   . Lung nodule   . Liver hemangioma   . Acid reflux disease   . Psoriasis     Past Surgical History  Procedure Laterality Date  . Neuroplasty major nerve    . Polypectomy    . Throat surgery      Family History  Problem Relation Age of Onset  . Cancer Mother     Esophageal Ca  . Asthma Brother     Social History   Social History  . Marital Status: Single    Spouse Name: N/A  . Number of Children: N/A  . Years of Education: N/A   Occupational History  . Not on file.   Social History Main Topics  . Smoking status: Former Smoker    Types: Cigarettes    Quit date: 12/31/2014  . Smokeless tobacco: Never Used  . Alcohol Use: No     Comment: 03/31/2012 sobierty   . Drug Use: No  . Sexual Activity: No   Other Topics Concern  . Not on file   Social History Narrative      Current outpatient prescriptions:  .  meloxicam (MOBIC) 15 MG tablet, Take 15 mg by mouth daily., Disp: , Rfl:  .  ARIPiprazole (ABILIFY) 5 MG tablet, Take 5 mg by mouth daily., Disp: , Rfl:  .  buPROPion (WELLBUTRIN XL) 150 MG 24 hr tablet, Take 150 mg by mouth daily., Disp: , Rfl:  .  calcipotriene (DOVONOX) 0.005 % ointment, Apply topically., Disp: , Rfl:  .  diazepam (VALIUM) 5 MG tablet, Take 2.5 mg by mouth 2 (two) times daily as needed for anxiety., Disp: , Rfl:  .  gabapentin (NEURONTIN) 300 MG capsule, Take 300 mg by mouth 2 (two) times daily., Disp: , Rfl:  .  pantoprazole (PROTONIX) 40 MG tablet, Take 40 mg by mouth daily., Disp: , Rfl:  .  solifenacin (VESICARE) 10 MG tablet, Take by mouth., Disp: , Rfl:  .  traZODone (DESYREL) 50 MG tablet, Take 50-150 mg by mouth at bedtime. , Disp: , Rfl:   Allergies  Allergen Reactions  . Codeine Nausea Only  . Haldol [Haloperidol] Other (See Comments)    Reaction: Pt "felt like her neck was going to snap."  . Haloperidol Lactate Other (See Comments)    Sever neck stiffness.  [name]  Trileptal [Oxcarbazepine] Swelling    Review of Systems  Constitutional: Positive for fever and chills.  HENT: Negative for sore throat.   Respiratory: Positive for cough and shortness of breath. Negative for sputum production and wheezing.   Cardiovascular: Positive for chest pain.    Objective  Filed Vitals:   08/14/15 1351  BP: 140/86  Pulse: 89  Temp: 98.7 F (37.1 C)  TempSrc: Oral  Resp: 16  Height: 5\' 3"  (1.6 m)  Weight: 185 lb 1.6 oz (83.961 kg)  SpO2: 95%    Physical Exam  Constitutional: She is oriented to person, place, and time and well-developed, well-nourished, and in no distress.  HENT:  Head: Normocephalic and atraumatic.  Mouth/Throat: Oropharynx is clear and moist.  Cardiovascular: Normal rate and regular rhythm.   Pulmonary/Chest: Effort normal and breath sounds normal.  Neurological: She is alert and oriented to  person, place, and time.  Nursing note and vitals reviewed.      Assessment & Plan  1. Community acquired pneumonia Appears to have resolved. Will obtain CXR in 2 weeks to ensure resolution. - DG Chest 2 View; Future  2. Abnormal urinalysis  - Urine Culture - Urinalysis, Routine w reflex microscopic   Jhaniya Briski Asad A. [facility] 08/14/2015 2:05 PM

## 2015-08-21 LAB — URINALYSIS, ROUTINE W REFLEX MICROSCOPIC
Bilirubin, UA: NEGATIVE
Glucose, UA: NEGATIVE
KETONES UA: NEGATIVE
LEUKOCYTES UA: NEGATIVE
Nitrite, UA: NEGATIVE
PH UA: 6.5 (ref 5.0–7.5)
PROTEIN UA: NEGATIVE
RBC, UA: NEGATIVE
SPEC GRAV UA: 1.009 (ref 1.005–1.030)
Urobilinogen, Ur: 0.2 mg/dL (ref 0.2–1.0)

## 2015-08-21 LAB — URINE CULTURE: Organism ID, Bacteria: NO GROWTH

## 2015-09-01 ENCOUNTER — Ambulatory Visit
Admission: RE | Admit: 2015-09-01 | Discharge: 2015-09-01 | Disposition: A | Discharge status: Home / Self Care | Payer: Medicaid Other | Source: Ambulatory Visit | Attending: Family Medicine | Admitting: Family Medicine

## 2015-09-01 DIAGNOSIS — R918 Other nonspecific abnormal finding of lung field: Secondary | ICD-10-CM | POA: Diagnosis not present

## 2015-09-01 DIAGNOSIS — J189 Pneumonia, unspecified organism: Secondary | ICD-10-CM

## 2015-09-02 ENCOUNTER — Telehealth: Payer: Self-pay | Admitting: Family Medicine

## 2015-09-02 NOTE — Telephone Encounter (Signed)
X-ray results have been reported to patient

## 2015-09-02 NOTE — Telephone Encounter (Signed)
Pt would like her results to her xray

## 2015-09-15 ENCOUNTER — Encounter: Payer: Self-pay | Admitting: Family Medicine

## 2015-09-15 ENCOUNTER — Ambulatory Visit (INDEPENDENT_AMBULATORY_CARE_PROVIDER_SITE_OTHER): Payer: Medicaid Other | Admitting: Family Medicine

## 2015-09-15 VITALS — BP 122/84 | HR 94 | Temp 98.3°F | Resp 16 | Ht 63.0 in | Wt 192.0 lb

## 2015-09-15 DIAGNOSIS — N6489 Other specified disorders of breast: Secondary | ICD-10-CM | POA: Diagnosis not present

## 2015-09-15 DIAGNOSIS — R918 Other nonspecific abnormal finding of lung field: Secondary | ICD-10-CM | POA: Diagnosis not present

## 2015-09-15 NOTE — Progress Notes (Signed)
Name: Kathryn Ramirez   MRN: [id number]    DOB: 09/16/1950   Date:09/15/2015       Progress Note  Subjective  Chief Complaint  Chief Complaint  Patient presents with  . Follow-up    to discuss ordering CT scan    HPI  Pt. Is here for follow up of Pulmonary Nodules. She has multiple small pulmonary nodules, was being followed by Pulmonology at [facility]. She had a CT of Chest in May 2016, which showed multiple 57mm nodules in upper lobes. She is requesting a referral to a local Pulmonologist to continue follow up of Pulmonary nodule.  In addition, the CT Scan in May 2016 revealed asymmetric breast tissue in upper outer quadrant of left breast, and mammography was recommended for correlation. She never went back to her primary care physician and is now requesting a mammogram.  Past Medical History  Diagnosis Date  . Bipolar 1 disorder ([place])   . Tardive dyskinesia   . Rheumatoid arthritis ([place])   . Hypertension   . Lung nodule   . Liver hemangioma   . Acid reflux disease   . Psoriasis     Past Surgical History  Procedure Laterality Date  . Neuroplasty major nerve    . Polypectomy    . Throat surgery      Family History  Problem Relation Age of Onset  . Cancer Mother     Esophageal Ca  . Asthma Brother     Social History   Social History  . Marital Status: Single    Spouse Name: N/A  . Number of Children: N/A  . Years of Education: N/A   Occupational History  . Not on file.   Social History Main Topics  . Smoking status: Former Smoker    Types: Cigarettes    Quit date: 12/31/2014  . Smokeless tobacco: Never Used  . Alcohol Use: No     Comment: 03/31/2012 sobierty   . Drug Use: No  . Sexual Activity: No   Other Topics Concern  . Not on file   Social History Narrative     Current outpatient prescriptions:  .  solifenacin (VESICARE) 10 MG tablet, Take by mouth., Disp: , Rfl:  .  ARIPiprazole (ABILIFY) 5 MG tablet, Take 5 mg by mouth daily., Disp: , Rfl:   .  buPROPion (WELLBUTRIN XL) 150 MG 24 hr tablet, Take 150 mg by mouth daily., Disp: , Rfl:  .  calcipotriene (DOVONOX) 0.005 % ointment, Apply topically., Disp: , Rfl:  .  diazepam (VALIUM) 5 MG tablet, Take 2.5 mg by mouth 2 (two) times daily as needed for anxiety., Disp: , Rfl:  .  gabapentin (NEURONTIN) 300 MG capsule, Take 300 mg by mouth 2 (two) times daily., Disp: , Rfl:  .  meloxicam (MOBIC) 15 MG tablet, Take 15 mg by mouth daily., Disp: , Rfl:  .  pantoprazole (PROTONIX) 40 MG tablet, Take 40 mg by mouth daily., Disp: , Rfl:  .  traZODone (DESYREL) 50 MG tablet, Take 50-150 mg by mouth at bedtime. , Disp: , Rfl:   Allergies  Allergen Reactions  . Codeine Nausea Only  . Haldol [Haloperidol] Other (See Comments)    Reaction: Pt "felt like her neck was going to snap."  . Haloperidol Lactate Other (See Comments)    Sever neck stiffness.  . Trileptal [Oxcarbazepine] Swelling     Review of Systems  Constitutional: Negative for fever, chills and weight loss.  Respiratory: Positive for cough, sputum production  and wheezing. Negative for shortness of breath.     Objective  Filed Vitals:   09/15/15 1520  BP: 122/84  Pulse: 94  Temp: 98.3 F (36.8 C)  TempSrc: Oral  Resp: 16  Height: 5\' 3"  (1.6 m)  Weight: 192 lb (87.091 kg)  SpO2: 93%    Physical Exam  Constitutional: She is oriented to person, place, and time and well-developed, well-nourished, and in no distress.  Cardiovascular: Normal rate and regular rhythm.   Pulmonary/Chest: Effort normal and breath sounds normal. She exhibits no mass and no tenderness. Right breast exhibits no mass, no nipple discharge, no skin change and no tenderness. Left breast exhibits no mass, no nipple discharge, no skin change and no tenderness.  Neurological: She is alert and oriented to person, place, and time.  Nursing note and vitals reviewed.    Assessment & Plan  1. Multiple pulmonary nodules Referral to pulmonology for  follow-up of pulmonary nodules. Records from [facility] including a CT scan of chest from May 2016 was reviewed in detail. - Ambulatory referral to Pulmonology  2. Breast asymmetry in female We'll obtain digital diagnostic mammogram with ultrasound for evaluation of asymmetry seen on chest CT. - MM Digital Diagnostic Unilat R; Future - MM Digital Diagnostic Unilat L; Future - MM Digital Diagnostic Bilat; Future - US BREAST LTD UNI LEFT INC AXILLA; Future  Shawon Denzer Asad A. [facility] 09/15/2015 3:47 PM

## 2015-09-18 ENCOUNTER — Encounter: Payer: Self-pay | Admitting: Internal Medicine

## 2015-09-18 ENCOUNTER — Ambulatory Visit (INDEPENDENT_AMBULATORY_CARE_PROVIDER_SITE_OTHER): Payer: Medicaid Other | Admitting: Internal Medicine

## 2015-09-18 VITALS — BP 132/80 | HR 75 | Ht 63.0 in | Wt 194.4 lb

## 2015-09-18 DIAGNOSIS — J45909 Unspecified asthma, uncomplicated: Secondary | ICD-10-CM | POA: Insufficient documentation

## 2015-09-18 DIAGNOSIS — J4530 Mild persistent asthma, uncomplicated: Secondary | ICD-10-CM

## 2015-09-18 DIAGNOSIS — R918 Other nonspecific abnormal finding of lung field: Secondary | ICD-10-CM | POA: Diagnosis not present

## 2015-09-18 MED ORDER — FLUTICASONE FUROATE 100 MCG/ACT IN AEPB: 1.0000 | INHALATION_SPRAY | Freq: Every day | RESPIRATORY_TRACT | Status: AC

## 2015-09-18 MED ORDER — FLUTICASONE FUROATE 100 MCG/ACT IN AEPB: 1.0000 | INHALATION_SPRAY | Freq: Every day | RESPIRATORY_TRACT | Status: DC

## 2015-09-18 NOTE — Patient Instructions (Addendum)
Follow up with Dr. Stevenson Clinch in: 3 months - repeat CT chest with contrast prior to follow up visit for pulmonary nodules - Arnuity 139mcg - 1 puff daily. -gargle and rinse after each use. If Arnuity not covered by insurance, can then try Qvar 13mcg, 1 puff BID, -gargle and rinse after each use.   - continue to avoid tobacco - PFTs and 6MWT prior to follow up.

## 2015-09-18 NOTE — Progress Notes (Signed)
[facility] Consultation    Date: 09/18/2015  MRN# [id number] Kathryn Ramirez 1950/09/14  Referring Physician: Dr. Manuella Ghazi PMD - Dr. Windell Hummingbird Kathryn Ramirez is a 65 y.o. old female seen in consultation for pulmonary nodules  CC:  Chief Complaint  Patient presents with  . pulmonary consult    pt. ref by dr. Manuella Ghazi for pulm nodules. pt. states she recently had abnormal CXR. c/o occ. SOB. occ dry cough. occ chest pain/tightness. denies wheezing.    HPI:  Patient is a pleasant 64 year old female past medical history of bipolar 1, tardive dyskinesia, rheumatoid arthritis, hypertension, multiple pulmonary nodules, asthma, acid reflux disease, obesity, seen in consultation for pulmonary nodules. History per patient, she states that she was diagnosed with pulmonary nodules about 2 years ago at [facility], has had 2 subsequent CT scans with stable nodules, and wanted to follow-up with a local pulmonary physician. She denies any significant weight loss, night sweats, fevers, chills. She has a history of asthma, today she admits to shortness of breath and dry cough, this is been going on for about the last week, she also states that she's been using pro-air 2-3 times per day since last Monday. She last had PFTs and 2015 at [facility]. Past surgical history includes thyroid nodule removal, and left knee surgery area She is a former smoker quit in August 2016, previously smoked one pack per day for 30 years. She previously worked as a Psychologist, sport and exercise, has since retired. Patient states during allergy seasons especially spring and late fall her asthma "acts up". Typical asthma symptoms are increased shortness of breath, dry cough and nasal congestion.    PMHX:   Past Medical History  Diagnosis Date  . Bipolar 1 disorder ([place])   . Tardive dyskinesia   . Rheumatoid arthritis ([place])   . Hypertension   . Lung nodule   . Liver hemangioma   . Acid reflux disease   . Psoriasis     Surgical Hx:  Past Surgical History  Procedure Laterality Date  . Neuroplasty major nerve    . Polypectomy    . Throat surgery     Family Hx:  Family History  Problem Relation Age of Onset  . Cancer Mother     Esophageal Ca  . Asthma Brother    Social Hx:   Social History  Substance Use Topics  . Smoking status: Current Every Day Smoker    Types: E-cigarettes    Last Attempt to Quit: 12/31/2014  . Smokeless tobacco: Never Used  . Alcohol Use: No     Comment: 03/31/2012 sobierty    Medication:   Current Outpatient Rx  Name  Route  Sig  Dispense  Refill  . albuterol (PROVENTIL HFA;VENTOLIN HFA) 108 (90 Base) MCG/ACT inhaler   Inhalation   Inhale into the lungs every 6 (six) hours as needed for wheezing or shortness of breath.         . ARIPiprazole (ABILIFY) 5 MG tablet   Oral   Take 5 mg by mouth daily.         [name] buPROPion (WELLBUTRIN XL) 150 MG 24 hr tablet   Oral   Take 150 mg by mouth daily.         . calcipotriene (DOVONOX) 0.005 % ointment   Topical   Apply topically.         . diazepam (VALIUM) 5 MG tablet   Oral   Take 2.5 mg by mouth 2 (two) times daily as  needed for anxiety.         . Fluticasone Furoate (ARNUITY ELLIPTA) 100 MCG/ACT AEPB   Inhalation   Inhale 1 puff into the lungs daily.   14 each   0   . Fluticasone Furoate (ARNUITY ELLIPTA) 100 MCG/ACT AEPB   Inhalation   Inhale 1 puff into the lungs daily.   30 each   5   . gabapentin (NEURONTIN) 300 MG capsule   Oral   Take 300 mg by mouth 2 (two) times daily.         . meloxicam (MOBIC) 15 MG tablet   Oral   Take 15 mg by mouth daily.         . pantoprazole (PROTONIX) 40 MG tablet   Oral   Take 40 mg by mouth daily.         . solifenacin (VESICARE) 10 MG tablet   Oral   Take by mouth.         . traZODone (DESYREL) 50 MG tablet   Oral   Take 50-150 mg by mouth at bedtime.              Allergies:  Codeine; Haldol; Haloperidol lactate; and  Trileptal  Review of Systems  Constitutional: Negative for fever, chills and weight loss.  HENT: Negative for hearing loss.   Eyes: Negative for double vision.  Respiratory: Positive for cough and shortness of breath.   Gastrointestinal: Negative for heartburn and nausea.  Genitourinary: Negative for dysuria.  Musculoskeletal: Negative for myalgias.  Skin: Negative for itching and rash.  Neurological: Negative for dizziness and headaches.  Endo/Heme/Allergies: Does not bruise/bleed easily.  Psychiatric/Behavioral: Negative for depression.     Physical Examination:   VS: BP 132/80 mmHg  Pulse 75  Ht 5\' 3"  (1.6 m)  Wt 194 lb 6.4 oz (88.179 kg)  BMI 34.44 kg/m2  SpO2 93%  General Appearance: No distress  Neuro:without focal findings, mental status, speech normal, alert and oriented, cranial nerves 2-12 intact, reflexes normal and symmetric, sensation grossly normal  HEENT: PERRLA, EOM intact, no ptosis, no other lesions noticed; Mallampati 2 Pulmonary: normal breath sounds., diaphragmatic excursion normal.No wheezing, No rales;   Sputum Production:  none CardiovascularNormal S1,S2.  No m/r/g.  Abdominal aorta pulsation normal.    Abdomen: Benign, Soft, non-tender, No masses, hepatosplenomegaly, No lymphadenopathy Renal:  No costovertebral tenderness  GU:  No performed at this time. Endoc: No evident thyromegaly, no signs of acromegaly or Cushing features Skin:   warm, no rashes, no ecchymosis  Extremities: normal, no cyanosis, clubbing, no edema, warm with normal capillary refill. Other findings:none   Labs results:   Rad results: (The following images and results were reviewed by Dr. Stevenson Clinch on 09/18/2015). CXR 09/02/15 CLINICAL DATA: Pneumonia. Difficulty breathing.  EXAM: CHEST 2 VIEW  COMPARISON: 07/31/2015.  FINDINGS: Mediastinum and hilar structures normal. Heart size normal. Density noted in the left lung base unchanged. Changes are consistent  with atelectasis and/or scarring. No new infiltrate. No pleural effusion or pneumothorax. Degenerative changes of the scoliosis thoracic spine.  IMPRESSION: Persistent unchanged dense in the left lung base consistent with atelectasis and/or scarring. No interim change .  CT Chest 11/2014 at [facility] CT Chest without contrast  Indication: R91.8 Other nonspecific abnormal finding of lung field, pulmonary nodule  Compare: CT 06/10/2014.  Technique:  Volumetric non-contrast chest CT acquisition was performed from the lower neck to the adrenal glands.  1.25 mm axial, 5 mm axial, 3 mm coronal, and 3 mm  sagittal reconstructions were performed. 3D axial maximum intensity projection images (MIPS) were reconstructed to facilitate lung nodule detection.  Findings:  The trachea and central airways are clear. Bibasilar dependent atelectasis. Mild biapical pleural parenchymal scarring. Linear atelectasis and mild bronchiectasis in the base of the medial segment right middle lobe likely related to prior infection is not significantly changed. Several 2 mm pulmonary nodules in the upper lobes (for example see series 603 image 17, image 19, and image 15) have been stable since 06/10/2014.  Visualized portions of the thyroid gland are within normal limits. No significant mediastinal or axillary lymphadenopathy by CT size criteria. No significant pericardial effusion. Coronary artery atherosclerosis. Thoracic aortic atherosclerosis. No thoracic aortic aneurysm. Limited evaluation of the upper abdomen demonstrates cholelithiasis. Again seen is asymmetric soft tissue in the upper outer quadrant of the left breast which is not significantly changed in appearance compared to the prior study from November 2015. No aggressive appearing osseous lesions in the thoracic spine.  Impression:  Several 2 mm pulmonary nodules are not significantly changed since 06/10/2014 and are likely related to prior  infection.  Asymmetric breast tissue in the upper outer quadrant of the left breast. Recommend correlation with mammography if not already performed.  Electronically Reviewed by:  Bluford Kaufmann, MD Electronically Reviewed on:  11/29/2014 4:02 PM  I have reviewed the images and concur with the above findings.  Electronically Signed by:  Lenell Antu, MD Electronically Signed on:  11/29/2014 4:06 PM    Assessment and Plan: 65 year old seen in consultation for pulmonary nodules and asthma Multiple pulmonary nodules CT scan from [facility] in May 2016 reviewed. There multiple subcentimeter diffuse pulmonary nodules that is stable in size over the past 2 years. The majority of these nodules are 31mm or less. There are no other significant masses or 2 was noted on the CT. Risk for lung cancer is low at this time given size and location and benign appearance of these nodules. Diffuse nodules are most likely a granulomatous or intrapulmonary nodules from prior infection.  Plan: -Repeat CT chest with contrast,-advised patient that yearly surveillance CT has not needed for subcentimeter nodules are stable, if this CT is stable compared to the rest, then there will be no further need for repeat chest imaging to follow these pulmonary nodules.  Asthma Overall well controlled except for the last week also due to possible environmental changes, rapid weather changes. Educated patient on the use of a rescue inhaler, and the use of a maintenance inhaler.  Plan: - Arnuity 163mcg - 1 puff daily. -gargle and rinse after each use. If Arnuity not covered by insurance, can then try Qvar 75mcg, 1 puff BID, -gargle and rinse after each use. - continue to avoid tobacco - PFTs and 6MWT prior to follow up.     Updated Medication List Outpatient Encounter Prescriptions as of 09/18/2015  Medication Sig  . albuterol (PROVENTIL HFA;VENTOLIN HFA) 108 (90 Base) MCG/ACT inhaler Inhale into the lungs every 6 (six)  hours as needed for wheezing or shortness of breath.  . ARIPiprazole (ABILIFY) 5 MG tablet Take 5 mg by mouth daily.  [name] buPROPion (WELLBUTRIN XL) 150 MG 24 hr tablet Take 150 mg by mouth daily.  . calcipotriene (DOVONOX) 0.005 % ointment Apply topically.  . diazepam (VALIUM) 5 MG tablet Take 2.5 mg by mouth 2 (two) times daily as needed for anxiety.  . Fluticasone Furoate (ARNUITY ELLIPTA) 100 MCG/ACT AEPB Inhale 1 puff into the lungs daily.  . Fluticasone Furoate (  ARNUITY ELLIPTA) 100 MCG/ACT AEPB Inhale 1 puff into the lungs daily.  [name] gabapentin (NEURONTIN) 300 MG capsule Take 300 mg by mouth 2 (two) times daily.  . meloxicam (MOBIC) 15 MG tablet Take 15 mg by mouth daily.  . pantoprazole (PROTONIX) 40 MG tablet Take 40 mg by mouth daily.  . solifenacin (VESICARE) 10 MG tablet Take by mouth.  . traZODone (DESYREL) 50 MG tablet Take 50-150 mg by mouth at bedtime.   . [DISCONTINUED] Fluticasone Furoate (ARNUITY ELLIPTA) 100 MCG/ACT AEPB Inhale 1 puff into the lungs daily.   No facility-administered encounter medications on file as of 09/18/2015.    Orders for this visit: Orders Placed This Encounter  Procedures  . CT Chest W Contrast    Standing Status: Future     Number of Occurrences:      Standing Expiration Date: 11/15/2016    Order Specific Question:  If indicated for the ordered procedure, I authorize the administration of contrast media per Radiology protocol    Answer:  Yes    Order Specific Question:  Reason for Exam (SYMPTOM  OR DIAGNOSIS REQUIRED)    Answer:  pulm nodules    Order Specific Question:  Preferred imaging location?    Answer:  [facility]  . 6 minute walk    Standing Status: Future     Number of Occurrences:      Standing Expiration Date: 09/17/2016  . Pulmonary function test    Standing Status: Future     Number of Occurrences:      Standing Expiration Date: 09/17/2016    Order Specific Question:  Where should this test be performed?    Answer:   [facility]    Order Specific Question:  Full PFT: includes the following: basic spirometry, spirometry pre & post bronchodilator, diffusion capacity (DLCO), lung volumes    Answer:  Full PFT    Order Specific Question:  MIP/MEP    Answer:  No    Order Specific Question:  6 minute walk    Answer:  Yes    Order Specific Question:  ABG    Answer:  No    Order Specific Question:  Diffusion capacity (DLCO)    Answer:  Yes    Order Specific Question:  Lung volumes    Answer:  Yes    Order Specific Question:  Methacholine challenge    Answer:  No     Thank  you for the consultation and for allowing [facility] to assist in the care of your patient. Our recommendations are noted above.  Please contact us if we can be of further service.   Vilinda Boehringer, MD [facility] Office Number: [phone]  Note: This note was prepared with Dragon dictation along with smaller phrase technology. Any transcriptional errors that result from this process are unintentional.

## 2015-09-18 NOTE — Assessment & Plan Note (Addendum)
Overall well controlled except for the last week also due to possible environmental changes, rapid weather changes. Educated patient on the use of a rescue inhaler, and the use of a maintenance inhaler.  Plan: - Arnuity 134mcg - 1 puff daily. -gargle and rinse after each use. If Arnuity not covered by insurance, can then try Qvar 106mcg, 1 puff BID, -gargle and rinse after each use. - continue to avoid tobacco - PFTs and 6MWT prior to follow up.

## 2015-09-18 NOTE — Assessment & Plan Note (Signed)
CT scan from [facility] in May 2016 reviewed. There multiple subcentimeter diffuse pulmonary nodules that is stable in size over the past 2 years. The majority of these nodules are 33mm or less. There are no other significant masses or 2 was noted on the CT. Risk for lung cancer is low at this time given size and location and benign appearance of these nodules. Diffuse nodules are most likely a granulomatous or intrapulmonary nodules from prior infection.  Plan: -Repeat CT chest with contrast,-advised patient that yearly surveillance CT has not needed for subcentimeter nodules are stable, if this CT is stable compared to the rest, then there will be no further need for repeat chest imaging to follow these pulmonary nodules.

## 2015-09-19 ENCOUNTER — Other Ambulatory Visit: Payer: Self-pay | Admitting: *Deleted

## 2015-09-19 NOTE — Telephone Encounter (Signed)
Initiated PA for [employer] thru [place] [employer]. [phone]. Sent for review. [id number]

## 2015-09-25 MED ORDER — BECLOMETHASONE DIPROPIONATE 40 MCG/ACT IN AERS: 1.0000 | INHALATION_SPRAY | Freq: Two times a day (BID) | RESPIRATORY_TRACT | Status: DC

## 2015-09-25 NOTE — Telephone Encounter (Signed)
Pt calling back with Pharmacy information. [facility] in [place]. I updated already in EPIC.

## 2015-09-25 NOTE — Telephone Encounter (Signed)
Called pt to inform her that [place] had been denied and that per VM OV note we will start pt on Qvar 81mcg 1 puff BID. Pt states she has changed pharmacies and will call me back with the name of the pharmacy. Will await call back.

## 2015-09-25 NOTE — Telephone Encounter (Signed)
New rx sent to pharmacy.  Nothing further needed.  

## 2015-10-10 ENCOUNTER — Telehealth: Payer: Self-pay | Admitting: Internal Medicine

## 2015-10-10 NOTE — Telephone Encounter (Signed)
Spoke with pt and informed her when her appt is due and test. I have scheduled her f/u appt and SMW and PFT. Suanne Marker is scheduling her CT Chest and will call her with that appt. We will mail her the information and appts to her.

## 2015-10-10 NOTE — Telephone Encounter (Signed)
Patient called and is confused on what and when she is supposed to have some test done. Please call patient.

## 2015-10-10 NOTE — Telephone Encounter (Signed)
Called and spoke with patient and scheduled CT Chest with contrast at [facility] on 12/10/15 at 1:00. Pt to arrive at 12:[address] Entrance. No solid foods 4 hours before, may have any liquids pt may want.  SMW/PFT instruction sheet, CT Chest appointment and F/U appointment card with Dr. Stevenson Clinch mailed to patient.  Pt verbalized understanding and aware that information to be mailed to her. Nothing else needed at this time. Rhonda J Cobb

## 2015-10-22 ENCOUNTER — Other Ambulatory Visit: Payer: Self-pay | Admitting: Family Medicine

## 2015-10-22 ENCOUNTER — Inpatient Hospital Stay
Admission: RE | Admit: 2015-10-22 | Discharge: 2015-10-22 | Disposition: A | Discharge status: Home / Self Care | Payer: Self-pay | Source: Ambulatory Visit | Attending: *Deleted | Admitting: *Deleted

## 2015-10-22 ENCOUNTER — Other Ambulatory Visit: Payer: Self-pay | Admitting: *Deleted

## 2015-10-22 DIAGNOSIS — Z9289 Personal history of other medical treatment: Secondary | ICD-10-CM

## 2015-10-22 DIAGNOSIS — N6489 Other specified disorders of breast: Secondary | ICD-10-CM

## 2015-11-06 ENCOUNTER — Other Ambulatory Visit: Payer: Self-pay | Admitting: Family Medicine

## 2015-11-06 ENCOUNTER — Ambulatory Visit
Admission: RE | Admit: 2015-11-06 | Discharge: 2015-11-06 | Disposition: A | Discharge status: Home / Self Care | Payer: Medicaid Other | Source: Ambulatory Visit | Attending: Family Medicine | Admitting: Family Medicine

## 2015-11-06 DIAGNOSIS — N6489 Other specified disorders of breast: Secondary | ICD-10-CM

## 2015-11-18 ENCOUNTER — Telehealth: Payer: Self-pay

## 2015-11-18 MED ORDER — SOLIFENACIN SUCCINATE 10 MG PO TABS: 10.0000 mg | ORAL_TABLET | Freq: Every day | ORAL | Status: DC

## 2015-11-18 NOTE — Telephone Encounter (Signed)
Medication has been refilled and sent to [facility]  

## 2015-12-08 ENCOUNTER — Ambulatory Visit: Payer: Medicaid Other

## 2015-12-10 ENCOUNTER — Ambulatory Visit
Admission: RE | Admit: 2015-12-10 | Discharge: 2015-12-10 | Disposition: A | Discharge status: Home / Self Care | Payer: Medicaid Other | Source: Ambulatory Visit | Attending: Internal Medicine | Admitting: Internal Medicine

## 2015-12-10 ENCOUNTER — Ambulatory Visit: Payer: Medicaid Other

## 2015-12-10 ENCOUNTER — Ambulatory Visit (INDEPENDENT_AMBULATORY_CARE_PROVIDER_SITE_OTHER): Payer: Medicaid Other | Admitting: *Deleted

## 2015-12-10 DIAGNOSIS — R918 Other nonspecific abnormal finding of lung field: Secondary | ICD-10-CM | POA: Insufficient documentation

## 2015-12-10 DIAGNOSIS — K8020 Calculus of gallbladder without cholecystitis without obstruction: Secondary | ICD-10-CM | POA: Insufficient documentation

## 2015-12-10 DIAGNOSIS — J479 Bronchiectasis, uncomplicated: Secondary | ICD-10-CM | POA: Insufficient documentation

## 2015-12-10 DIAGNOSIS — R932 Abnormal findings on diagnostic imaging of liver and biliary tract: Secondary | ICD-10-CM | POA: Insufficient documentation

## 2015-12-10 DIAGNOSIS — J45909 Unspecified asthma, uncomplicated: Secondary | ICD-10-CM

## 2015-12-10 LAB — PULMONARY FUNCTION TEST
DL/VA % pred: 94 %
DL/VA: 4.42 ml/min/mmHg/L
DLCO UNC % PRED: 128 %
DLCO UNC: 29.48 ml/min/mmHg
FEF 25-75 Post: 2.93 L/sec
FEF 25-75 Pre: 2.52 L/sec
FEF2575-%CHANGE-POST: 16 %
FEF2575-%PRED-POST: 137 %
FEF2575-%PRED-PRE: 117 %
FEV1-%CHANGE-POST: 1 %
FEV1-%PRED-POST: 82 %
FEV1-%Pred-Pre: 81 %
FEV1-POST: 1.96 L
FEV1-Pre: 1.92 L
FEV1FVC-%CHANGE-POST: 3 %
FEV1FVC-%PRED-PRE: 110 %
FEV6-%CHANGE-POST: -1 %
FEV6-%Pred-Post: 74 %
FEV6-%Pred-Pre: 75 %
FEV6-PRE: 2.26 L
FEV6-Post: 2.22 L
FEV6FVC-%PRED-PRE: 104 %
FEV6FVC-%Pred-Post: 104 %
FVC-%Change-Post: -1 %
FVC-%Pred-Post: 71 %
FVC-%Pred-Pre: 73 %
FVC-Post: 2.22 L
FVC-Pre: 2.26 L
POST FEV1/FVC RATIO: 88 %
PRE FEV6/FVC RATIO: 100 %
Post FEV6/FVC ratio: 100 %
Pre FEV1/FVC ratio: 85 %
RV % pred: 127 %
RV: 2.57 L
TLC % pred: 99 %
TLC: 4.87 L

## 2015-12-10 LAB — POCT I-STAT CREATININE: CREATININE: 0.8 mg/dL (ref 0.44–1.00)

## 2015-12-10 MED ORDER — IOPAMIDOL (ISOVUE-300) INJECTION 61%
75.0000 mL | Freq: Once | INTRAVENOUS | Status: AC | PRN
  Administered 2015-12-10: 75 mL via INTRAVENOUS

## 2015-12-10 NOTE — Progress Notes (Signed)
PFT performed today. 

## 2015-12-10 NOTE — Progress Notes (Signed)
SMW performed today. 

## 2015-12-11 ENCOUNTER — Ambulatory Visit: Payer: Medicaid Other | Admitting: Internal Medicine

## 2015-12-12 ENCOUNTER — Telehealth: Payer: Self-pay | Admitting: *Deleted

## 2015-12-12 ENCOUNTER — Ambulatory Visit: Payer: Medicaid Other | Admitting: Internal Medicine

## 2015-12-12 NOTE — Telephone Encounter (Signed)
Pt informed of CT results. Nothing further needed. 

## 2015-12-12 NOTE — Telephone Encounter (Signed)
-----   Message from Vilinda Boehringer, MD sent at 12/11/2015  8:32 AM EDT ----- Regarding: CT chest results Please inform patient that her CT Chest is stable, there are no new nodules, no enlargement of nodules; there are findings of prior infection, but this is stable.  Overall, stable CT Chest with no acute findings. Further details can be discussed at follow up visit.   Thank you VM

## 2015-12-18 ENCOUNTER — Ambulatory Visit (INDEPENDENT_AMBULATORY_CARE_PROVIDER_SITE_OTHER): Payer: Medicaid Other | Admitting: Family Medicine

## 2015-12-18 ENCOUNTER — Encounter: Payer: Self-pay | Admitting: Family Medicine

## 2015-12-18 VITALS — BP 127/80 | HR 91 | Temp 97.9°F | Resp 16 | Ht 63.0 in | Wt 208.0 lb

## 2015-12-18 DIAGNOSIS — N3281 Overactive bladder: Secondary | ICD-10-CM

## 2015-12-18 MED ORDER — SOLIFENACIN SUCCINATE 10 MG PO TABS: 10.0000 mg | ORAL_TABLET | Freq: Every day | ORAL | Status: DC

## 2015-12-18 NOTE — Progress Notes (Signed)
Name: Kathryn Ramirez   MRN: [id number]    DOB: 03/12/1951   Date:12/18/2015       Progress Note  Subjective  Chief Complaint  Chief Complaint  Patient presents with  . Hospitalization Follow-up  . Medication Refill    vesicare 10 mg     HPI  Overactive Bladder: Pt. Is here for med refill and follow up on OAB. Pt. Reports when she feels the need to urinate, her bladder 'empties completely, not little dribbles,' , which is understandably very embarrassing. She takes Vesicare 10 mg daily, which helps 'hold it' until she can make it to the bathroom.  No reported adverse effects with Vesicare.    Past Medical History  Diagnosis Date  . Bipolar 1 disorder ([place])   . Tardive dyskinesia   . Rheumatoid arthritis ([place])   . Hypertension   . Lung nodule   . Liver hemangioma   . Acid reflux disease   . Psoriasis     Past Surgical History  Procedure Laterality Date  . Neuroplasty major nerve    . Polypectomy    . Throat surgery      Family History  Problem Relation Age of Onset  . Cancer Mother     Esophageal Ca  . Asthma Brother   . Breast cancer Neg Hx     Social History   Social History  . Marital Status: Single    Spouse Name: N/A  . Number of Children: N/A  . Years of Education: N/A   Occupational History  . Not on file.   Social History Main Topics  . Smoking status: Current Every Day Smoker    Types: E-cigarettes    Last Attempt to Quit: 12/31/2014  . Smokeless tobacco: Never Used  . Alcohol Use: No     Comment: 03/31/2012 sobierty   . Drug Use: No  . Sexual Activity: No   Other Topics Concern  . Not on file   Social History Narrative     Current outpatient prescriptions:  .  albuterol (PROVENTIL HFA;VENTOLIN HFA) 108 (90 Base) MCG/ACT inhaler, Inhale into the lungs every 6 (six) hours as needed for wheezing or shortness of breath., Disp: , Rfl:  .  ARIPiprazole (ABILIFY) 5 MG tablet, Take 5 mg by mouth daily., Disp: , Rfl:  .  beclomethasone  (QVAR) 40 MCG/ACT inhaler, Inhale 1 puff into the lungs 2 (two) times daily., Disp: 1 Inhaler, Rfl: 12 .  buPROPion (WELLBUTRIN XL) 150 MG 24 hr tablet, Take 150 mg by mouth daily., Disp: , Rfl:  .  calcipotriene (DOVONOX) 0.005 % ointment, Apply topically., Disp: , Rfl:  .  diazepam (VALIUM) 5 MG tablet, Take 2.5 mg by mouth 2 (two) times daily as needed for anxiety., Disp: , Rfl:  .  diazepam (VALIUM) 5 MG tablet, Take by mouth., Disp: , Rfl:  .  gabapentin (NEURONTIN) 300 MG capsule, Take 300 mg by mouth 2 (two) times daily., Disp: , Rfl:  .  pantoprazole (PROTONIX) 40 MG tablet, Take 40 mg by mouth daily., Disp: , Rfl:  .  solifenacin (VESICARE) 10 MG tablet, Take 1 tablet (10 mg total) by mouth daily., Disp: 30 tablet, Rfl: 0 .  traMADol (ULTRAM) 50 MG tablet, Take by mouth., Disp: , Rfl:  .  traZODone (DESYREL) 50 MG tablet, Take 50-150 mg by mouth at bedtime. , Disp: , Rfl:   Allergies  Allergen Reactions  . Codeine Nausea Only  . Haldol [Haloperidol] Other (See Comments)  Reaction: Pt "felt like her neck was going to snap."  . Haloperidol Lactate Other (See Comments)    Sever neck stiffness.  . Trileptal [Oxcarbazepine] Swelling     Review of Systems  Constitutional: Negative for fever and chills.  Genitourinary: Positive for urgency. Negative for dysuria and hematuria.  Musculoskeletal: Positive for back pain.     Objective  Filed Vitals:   12/18/15 1031  BP: 127/80  Pulse: 91  Temp: 97.9 F (36.6 C)  TempSrc: Oral  Resp: 16  Height: 5\' 3"  (1.6 m)  Weight: 208 lb (94.348 kg)  SpO2: 96%    Physical Exam  Constitutional: She is oriented to person, place, and time and well-developed, well-nourished, and in no distress.  HENT:  Head: Normocephalic and atraumatic.  Cardiovascular: Normal rate and regular rhythm.   Pulmonary/Chest: Effort normal and breath sounds normal.  Abdominal: Soft. Bowel sounds are normal.  Neurological: She is alert and oriented to  person, place, and time.  Nursing note and vitals reviewed.      Assessment & Plan  1. OAB (overactive bladder) Refills for Vesicare provided. - solifenacin (VESICARE) 10 MG tablet; Take 1 tablet (10 mg total) by mouth daily.  Dispense: 90 tablet; Refill: 1   Other [facility] 12/18/2015 10:46 AM

## 2016-01-06 ENCOUNTER — Ambulatory Visit (INDEPENDENT_AMBULATORY_CARE_PROVIDER_SITE_OTHER): Payer: Medicaid Other | Admitting: Internal Medicine

## 2016-01-06 ENCOUNTER — Encounter: Payer: Self-pay | Admitting: Internal Medicine

## 2016-01-06 VITALS — BP 142/86 | HR 87 | Ht 63.0 in | Wt 207.0 lb

## 2016-01-06 DIAGNOSIS — R918 Other nonspecific abnormal finding of lung field: Secondary | ICD-10-CM | POA: Diagnosis not present

## 2016-01-06 DIAGNOSIS — J45909 Unspecified asthma, uncomplicated: Secondary | ICD-10-CM

## 2016-01-06 DIAGNOSIS — J984 Other disorders of lung: Secondary | ICD-10-CM

## 2016-01-06 NOTE — Assessment & Plan Note (Signed)
CT scan from [facility] in May 2016 reviewed. There multiple subcentimeter diffuse pulmonary nodules that is stable in size over the past 2 years. The majority of these nodules are 27mm or less. There are no other significant masses or 2 was noted on the CT. Risk for lung cancer is low at this time given size and location and benign appearance of these nodules. Diffuse nodules are most likely a granulomatous or intrapulmonary nodules from prior infection.  Plan: - No further surveillance CT needed and she has no new nodules on her recent CT and no tumors or masses.

## 2016-01-06 NOTE — Assessment & Plan Note (Signed)
Multifactorial-asthma, tobacco abuse, continued noxious substances inhalation (vaping) CT scan of chest with right middle lobe bronchiectasis along with right middle lobe scarring and lower lobe scarring.  Discuss results of CAT scan with patient, given her recent increase in shortness of breath along with productive sputum, and using electronic cigarettes, she could have a residual infection. Will proceed with bronchoscopy with BAL under moderate sedation.  Plan: -Avoid all noxious substances including inotropic cigarettes -Bronchoscopy with BAL.

## 2016-01-06 NOTE — Patient Instructions (Signed)
Follow up with Dr. Stevenson Clinch in: 62months - We will arrange a bronchoscopy with BAL -Avoid all forms of tobacco use including electronic cigarettes, vapors, puffing. -Avoid all noxious substances and occluding charcoal, wood-burning stoves, direct exposure to barbecue pits -Continue which her Qvar as prescribed -Diet and exercise as tolerated

## 2016-01-06 NOTE — Progress Notes (Signed)
[facility] Consultation      MRN# [id number] Kathryn Ramirez 11/12/1950   CC: Chief Complaint  Patient presents with  . Follow-up    SMW, PFT & CT results. breathing is baseline since last OV. c/o cont sob w/exertion, prod cough w/clear mucus,occ wheezing.       Brief History: Patient is a pleasant 65 year old female past medical history of bipolar 1, tardive dyskinesia, rheumatoid arthritis, hypertension, multiple pulmonary nodules, asthma, acid reflux disease, obesity, seen in consultation for pulmonary nodules. History per patient, she states that she was diagnosed with pulmonary nodules about 2 years ago at [facility], has had 2 subsequent CT scans with stable nodules, and wanted to follow-up with a local pulmonary physician. She denies any significant weight loss, night sweats, fevers, chills. She has a history of asthma, today she admits to shortness of breath and dry cough, this is been going on for about the last week, she also states that she's been using pro-air 2-3 times per day since last Monday. She last had PFTs and 2015 at [facility]. Past surgical history includes thyroid nodule removal, and left knee surgery area She is a former smoker quit in August 2016, previously smoked one pack per day for 30 years. She previously worked as a Psychologist, sport and exercise, has since retired. Patient states during allergy seasons especially spring and late fall her asthma "acts up". Typical asthma symptoms are increased shortness of breath, dry cough and nasal congestion.   Events since last clinic visit: She presents today for follow-up visit of chronic cough along with asthma. She states that over the past 2 months she has been using vapors. Overall has increased sore throat with cough, intermittent productive sputum at times, described as yellowish. Today she is admitted to intermittent shortness of breath, cough, intermittent wheezing. Overall she feels the Qvar is  helping her. We discussed the results of the CAT scan today, with supper bronchoscopy with BAL moving forward.  Patient scheduled for C-spine surgery later on this month, we discussed importance of having the bronchoscopy with BAL and treating any underlying infection prior to her C-spine surgery.    Medication:   Current Outpatient Rx  Name  Route  Sig  Dispense  Refill  . albuterol (PROVENTIL HFA;VENTOLIN HFA) 108 (90 Base) MCG/ACT inhaler   Inhalation   Inhale into the lungs every 6 (six) hours as needed for wheezing or shortness of breath.         . ARIPiprazole (ABILIFY) 5 MG tablet   Oral   Take 5 mg by mouth daily.         . beclomethasone (QVAR) 40 MCG/ACT inhaler   Inhalation   Inhale 1 puff into the lungs 2 (two) times daily.   1 Inhaler   12   . buPROPion (WELLBUTRIN XL) 150 MG 24 hr tablet   Oral   Take 150 mg by mouth daily.         . calcipotriene (DOVONOX) 0.005 % ointment   Topical   Apply topically.         . diazepam (VALIUM) 5 MG tablet   Oral   Take by mouth.         . gabapentin (NEURONTIN) 300 MG capsule   Oral   Take 300 mg by mouth 2 (two) times daily.         . pantoprazole (PROTONIX) 40 MG tablet   Oral   Take 40 mg by mouth daily.         [name]  solifenacin (VESICARE) 10 MG tablet   Oral   Take 1 tablet (10 mg total) by mouth daily.   90 tablet   1   . traMADol (ULTRAM) 50 MG tablet   Oral   Take by mouth.         . traZODone (DESYREL) 50 MG tablet   Oral   Take 50-150 mg by mouth at bedtime.             Review of Systems  Constitutional: Negative for fever and chills.  Eyes: Negative for blurred vision.  Respiratory: Positive for cough, sputum production, shortness of breath and wheezing.   Cardiovascular: Negative for chest pain.  Gastrointestinal: Negative for heartburn, nausea and vomiting.  Musculoskeletal: Negative for myalgias.  Skin: Negative for rash.  Neurological: Negative for headaches.    Endo/Heme/Allergies: Does not bruise/bleed easily.      Allergies:  Codeine; Haldol; Haloperidol lactate; and Trileptal  Physical Examination:  VS: BP 142/86 mmHg  Pulse 87  Ht 5\' 3"  (1.6 m)  Wt 207 lb (93.895 kg)  BMI 36.68 kg/m2  SpO2 95%  General Appearance: No distress  HEENT: PERRLA, no ptosis, no other lesions noticed Pulmonary:normal breath sounds., diaphragmatic excursion normal.No wheezing, No rales   Cardiovascular:  Normal S1,S2.  No m/r/g.     Abdomen:Exam: Benign, Soft, non-tender, No masses  Skin:   warm, no rashes, no ecchymosis  Extremities: normal, no cyanosis, clubbing, warm with normal capillary refill.    PFTS 12/10/15 FEV1 81% FEV1/FVC 85% RV 127% TLC 99% ERV 13% DLCO uncorrected 120% Impression: Normal flow loops, no significant obstruction, no response to bronchodilators. Mild air trapping. 6 minute walk tests 12/10/2015-lowest saturation 93%, highest heart rate 101 bpm, total distance 264 m/866 feet.  Rad results: (The following images and results were reviewed by Dr. Stevenson Clinch on 01/06/2016). CT CHEST WITH CONTRAST 11/2015  TECHNIQUE: Multidetector CT imaging of the chest was performed during intravenous contrast administration.  CONTRAST: 73mL ISOVUE-300 IOPAMIDOL (ISOVUE-300) INJECTION 61%  COMPARISON: Chest x-ray of 09/01/2015 and 07/31/2015  FINDINGS: Linear opacities in primarily of the lingula and right middle lobe and to lesser degree anterior left lower lobe are most consistent with scarring. There is mild bronchiectatic change medially within the right middle lobe most likely due to prior infection and scarring. However, no active parenchymal process is seen. No pleural effusion is noted. No suspicious lung nodule is seen. The central airway is patent. The thoracic vertebrae are in normal alignment with mild degenerative change present. No compression deformity is seen.  On soft tissue window images the thyroid gland is  unremarkable. The thoracic aorta opacifies with no significant abnormality noted. The pulmonary arteries also opacify with no central abnormality. No mediastinal or hilar adenopathy is seen. The liver appears somewhat low in attenuation which may indicate fatty infiltration. In addition multiple faintly calcified gallstones layer dependently within the gallbladder. No gallbladder wall thickening is seen.  IMPRESSION: 1. Linear scarring primarily in the right middle lobe and lingula with bronchiectatic change in the right middle lobe consistent with prior infection. No definite active process is seen and no effusion is noted. 2. Multiple gallstones within the gallbladder. 3. Suspect fatty infiltration of the liver.  PFTs 12/10/15 FEV1 1% FEV1/FVC 85% RV 127% TLC 99% RV/TLC 120% DLCO corrected 128% Phillips with normal appearance Impression: No center can obstruction, no response to bronchodilator, normal flow loops, mild air trapping. 6 minute walk test lowest saturation 93%, highest heart rate 101, total distance 866 feet/264  m   Assessment and Plan: 65 year old female seen for chronic cough and asthma optimization. Pulmonary scarring (HCC) Multifactorial-asthma, tobacco abuse, continued noxious substances inhalation (vaping) CT scan of chest with right middle lobe bronchiectasis along with right middle lobe scarring and lower lobe scarring.  Discuss results of CAT scan with patient, given her recent increase in shortness of breath along with productive sputum, and using electronic cigarettes, she could have a residual infection. Will proceed with bronchoscopy with BAL under moderate sedation.  Plan: -Avoid all noxious substances including inotropic cigarettes -Bronchoscopy with BAL.  Asthma Overall well controlled except for the last week also due to possible environmental changes, rapid weather changes. Educated patient on the use of a rescue inhaler, and the use of a  maintenance inhaler. PFTs - Impression: Normal flow loops, no significant obstruction, no response to bronchodilators. Mild air trapping.  Plan: - Qvar 32mcg, 1 puff BID, -gargle and rinse after each use. - continue to avoid tobacco - STOP ecigs/vaping    Multiple pulmonary nodules CT scan from [facility] in May 2016 reviewed. There multiple subcentimeter diffuse pulmonary nodules that is stable in size over the past 2 years. The majority of these nodules are 70mm or less. There are no other significant masses or 2 was noted on the CT. Risk for lung cancer is low at this time given size and location and benign appearance of these nodules. Diffuse nodules are most likely a granulomatous or intrapulmonary nodules from prior infection.  Plan: - No further surveillance CT needed and she has no new nodules on her recent CT and no tumors or masses.      Updated Medication List Outpatient Encounter Prescriptions as of 01/06/2016  Medication Sig  . albuterol (PROVENTIL HFA;VENTOLIN HFA) 108 (90 Base) MCG/ACT inhaler Inhale into the lungs every 6 (six) hours as needed for wheezing or shortness of breath.  . ARIPiprazole (ABILIFY) 5 MG tablet Take 5 mg by mouth daily.  . beclomethasone (QVAR) 40 MCG/ACT inhaler Inhale 1 puff into the lungs 2 (two) times daily.  [name] buPROPion (WELLBUTRIN XL) 150 MG 24 hr tablet Take 150 mg by mouth daily.  . calcipotriene (DOVONOX) 0.005 % ointment Apply topically.  . diazepam (VALIUM) 5 MG tablet Take by mouth.  . gabapentin (NEURONTIN) 300 MG capsule Take 300 mg by mouth 2 (two) times daily.  . pantoprazole (PROTONIX) 40 MG tablet Take 40 mg by mouth daily.  . solifenacin (VESICARE) 10 MG tablet Take 1 tablet (10 mg total) by mouth daily.  . traMADol (ULTRAM) 50 MG tablet Take by mouth.  . traZODone (DESYREL) 50 MG tablet Take 50-150 mg by mouth at bedtime.   . [DISCONTINUED] diazepam (VALIUM) 5 MG tablet Take 2.5 mg by mouth 2 (two) times daily as  needed for anxiety. Reported on 01/06/2016   No facility-administered encounter medications on file as of 01/06/2016.    Orders for this visit: No orders of the defined types were placed in this encounter.    Thank  you for the visitation and for allowing  [facility] to assist in the care of your patient. Our recommendations are noted above.  Please contact us if we can be of further service.  Kathryn Boehringer, MD [facility] Office Number: [phone]  Note: This note was prepared with Dragon dictation along with smaller phrase technology. Any transcriptional errors that result from this process are unintentional.

## 2016-01-06 NOTE — Assessment & Plan Note (Addendum)
Overall well controlled except for the last week also due to possible environmental changes, rapid weather changes. Educated patient on the use of a rescue inhaler, and the use of a maintenance inhaler. PFTs - Impression: Normal flow loops, no significant obstruction, no response to bronchodilators. Mild air trapping.  Plan: - Qvar 85mcg, 1 puff BID, -gargle and rinse after each use. - continue to avoid tobacco - STOP ecigs/vaping

## 2016-01-07 ENCOUNTER — Other Ambulatory Visit: Payer: Self-pay | Admitting: Orthopedic Surgery

## 2016-01-07 DIAGNOSIS — M5030 Other cervical disc degeneration, unspecified cervical region: Secondary | ICD-10-CM

## 2016-01-12 ENCOUNTER — Telehealth: Payer: Self-pay | Admitting: Internal Medicine

## 2016-01-12 NOTE — Telephone Encounter (Signed)
Pt calling asking about her Bronc that she has tomorrow.  Please call back.

## 2016-01-12 NOTE — Telephone Encounter (Signed)
Spoke with pt and informed with the time and NPO after midnight. Nothing further needed.

## 2016-01-13 ENCOUNTER — Encounter
Admission: RE | Disposition: A | Discharge status: Home / Self Care | Payer: Self-pay | Source: Ambulatory Visit | Attending: Internal Medicine

## 2016-01-13 ENCOUNTER — Ambulatory Visit
Admission: RE | Admit: 2016-01-13 | Discharge: 2016-01-13 | Disposition: A | Discharge status: Home / Self Care | Payer: Medicaid Other | Source: Ambulatory Visit | Attending: Internal Medicine | Admitting: Internal Medicine

## 2016-01-13 ENCOUNTER — Encounter: Payer: Self-pay | Admitting: *Deleted

## 2016-01-13 DIAGNOSIS — R05 Cough: Secondary | ICD-10-CM | POA: Diagnosis not present

## 2016-01-13 DIAGNOSIS — R053 Chronic cough: Secondary | ICD-10-CM | POA: Insufficient documentation

## 2016-01-13 DIAGNOSIS — J9811 Atelectasis: Secondary | ICD-10-CM | POA: Insufficient documentation

## 2016-01-13 DIAGNOSIS — R918 Other nonspecific abnormal finding of lung field: Secondary | ICD-10-CM | POA: Diagnosis not present

## 2016-01-13 HISTORY — PX: FLEXIBLE BRONCHOSCOPY: SHX5094

## 2016-01-13 SURGERY — BRONCHOSCOPY, FLEXIBLE
Anesthesia: Moderate Sedation

## 2016-01-13 MED ORDER — MIDAZOLAM HCL 2 MG/2ML IJ SOLN
INTRAMUSCULAR | Status: AC | PRN
  Administered 2016-01-13: 0.5 mg via INTRAVENOUS
  Administered 2016-01-13: 2 mg via INTRAVENOUS
  Administered 2016-01-13: 0.5 mg via INTRAVENOUS

## 2016-01-13 MED ORDER — MIDAZOLAM HCL 5 MG/5ML IJ SOLN
INTRAMUSCULAR | Status: AC
  Filled 2016-01-13: qty 10

## 2016-01-13 MED ORDER — FENTANYL CITRATE (PF) 100 MCG/2ML IJ SOLN
INTRAMUSCULAR | Status: AC
  Filled 2016-01-13: qty 4

## 2016-01-13 MED ORDER — LIDOCAINE HCL 2 % EX GEL: 1.0000 "application " | Freq: Once | CUTANEOUS | Status: DC

## 2016-01-13 MED ORDER — FENTANYL CITRATE (PF) 100 MCG/2ML IJ SOLN
INTRAMUSCULAR | Status: AC | PRN
  Administered 2016-01-13: 50 ug via INTRAVENOUS
  Administered 2016-01-13: 100 ug via INTRAVENOUS
  Administered 2016-01-13 (×2): 50 ug via INTRAVENOUS

## 2016-01-13 MED ORDER — PHENYLEPHRINE HCL 0.25 % NA SOLN
1.0000 | Freq: Four times a day (QID) | NASAL | Status: DC | PRN
  Filled 2016-01-13: qty 15

## 2016-01-13 MED ORDER — BUTAMBEN-TETRACAINE-BENZOCAINE 2-2-14 % EX AERO
1.0000 | INHALATION_SPRAY | Freq: Once | CUTANEOUS | Status: DC
  Filled 2016-01-13: qty 20

## 2016-01-13 MED ORDER — SODIUM CHLORIDE 0.9 % IV SOLN
Freq: Once | INTRAVENOUS | Status: DC
  Administered 2016-01-13: 13:00:00 via INTRAVENOUS

## 2016-01-13 NOTE — Progress Notes (Signed)
Pt clinically stable post procedure, no coughing, vss, friend present, discharge teaching done with questions answered, taking po's without difficulty.

## 2016-01-13 NOTE — Op Note (Addendum)
[facility] Patient Name: Kathryn Ramirez Procedure Date: 01/13/2016 11:57 AM MRN: [id number] Account #: [id number] Date of Birth: 04/06/1951 Admit Type: Outpatient Age: 65 Room: [facility] PROCEDURE RM 01 Gender: Female Note Status: Finalized Attending MD: Vilinda Boehringer , MD Procedure:         Bronchoscopy Indications:       Atelectasis of the right middle lobe, Diagnostic                     bronchoalveolar lavage, Chronic cough with abnormal CT Providers:         Vilinda Boehringer, MD Referring MD:       Medicines:         Jelly 2% Xylocaine per nare 1 mL, Fentanyl 250 mcg IV,                     Midazolam 3 mg mg IV Complications:     No immediate complications Procedure:         Pre-Anesthesia Assessment:                    - A History and Physical has been performed. Patient meds                     and allergies have been reviewed. The risks and benefits                     of the procedure and the sedation options and risks were                     discussed with the patient. All questions were answered                     and informed consent was obtained. Patient identification                     and proposed procedure were verified prior to the                     procedure. Mental Status Examination: normal. Airway                     Examination: normal oropharyngeal airway. After reviewing                     the risks and benefits, the patient was deemed in                     satisfactory condition to undergo the procedure. The                     anesthesia plan was to use moderate sedation / analgesia                     (conscious sedation). Immediately prior to administration                     of medications, the patient was re-assessed for adequacy                     to receive sedatives. The heart rate, respiratory rate,                     oxygen saturations, blood pressure, adequacy of pulmonary  ventilation, and response to  care were monitored                     throughout the procedure. The physical status of the                     patient was re-assessed after the procedure.                    After obtaining informed consent, the bronchoscope was                     passed under direct vision. Throughout the procedure, the                     patient's blood pressure, pulse, and oxygen saturations                     were monitored continuously. the Bronchoscope Olympus                     [id number] [id number] was introduced through the nose, via                     the endotracheal tube and advanced to the tracheobronchial                     tree. The procedure was accomplished without difficulty. Findings:      The oropharynx appears normal. The larynx appears normal. The vocal       cords appear normal. The subglottic space is normal. The trachea is of       normal caliber. The carina is sharp. The tracheobronchial tree was       examined to at least the first subsegmental level. Bronchial mucosa and       anatomy are normal; there are no endobronchial lesions, and no       secretions.      Bronchoalveolar lavage was performed in the RML medial segment (B5) of       the lung and sent for cell count, bacterial culture, viral smears &       culture, and fungal & AFB analysis. 40 mL of fluid were instilled. 20 mL       were returned. The return was cloudy. There were no mucoid plugs in the       return fluid.      Mild upper airway (RBI, LMS) mucosal inflammation. Impression:        - Atelectasis of the right middle lobe                    - Bronchoalveolar lavage                    - Chronic cough with abnormal CT                    - The examination was normal.                    - Bronchoalveolar lavage was performed.                    - The examination was normal. Recommendation:    - Await BAL results.                    - Discharge patient to  home (ambulatory). Vilinda Boehringer, MD 01/13/2016  12:24:14 PM This report has been signed electronically. Number of Addenda: 0 Note Initiated On: 01/13/2016 11:57 AM      [facility]

## 2016-01-13 NOTE — Discharge Instructions (Signed)
Bronchoscopy Flexible Bronchoscopy, Care After These instructions give you information on caring for yourself after your procedure. Your doctor may also give you more specific instructions. Call your doctor if you have any problems or questions after your procedure. HOME CARE  Do not eat or drink anything for 2 hours after your procedure. If you try to eat or drink before the medicine wears off, food or drink could go into your lungs. You could also burn yourself.  After 2 hours have passed and when you can cough and gag normally, you may eat soft food and drink liquids slowly.  The day after the test, you may eat your normal diet.  You may do your normal activities.  Keep all doctor visits. GET HELP RIGHT AWAY IF:  You get more and more short of breath.  You get light-headed.  You feel like you are going to pass out (faint).  You have chest pain.  You have new problems that worry you.  You cough up more than a little blood.  You cough up more blood than before. MAKE SURE YOU:  Understand these instructions.  Will watch your condition.  Will get help right away if you are not doing well or get worse.   This information is not intended to replace advice given to you by your health care provider. Make sure you discuss any questions you have with your health care provider.   Document Released: 05/09/2009 Document Revised: 07/17/2013 Document Reviewed: 03/16/2013 Elsevier Interactive Patient Education [employer].

## 2016-01-13 NOTE — H&P (Signed)
See full H&P from clinic note on 12/18/15 Chronic cough with possible RML infection on CT, plan for bronchoscopy with BAL today.  Physical Exam Filed Vitals:   01/13/16 1111  BP: 135/88  Pulse: 72  Temp: 97.8 F (36.6 C)   GEN:NAD HEENT-PERRLA, no lesion CVS-s1, s2, no murmurs LUNGS- CTAB, no wheezes ABD- soft, nt, nd, +BS MSK-no lesion NEURO - AAO x 3  Vilinda Boehringer, MD [facility] Pager [phone] (please enter 7-digits) On Call Pager - [phone] (please enter 7-digits) Clinic - [phone]

## 2016-01-16 LAB — CULTURE, BAL-QUANTITATIVE W GRAM STAIN
Culture: NO GROWTH
Special Requests: NORMAL

## 2016-01-16 LAB — CULTURE, BAL-QUANTITATIVE

## 2016-01-19 NOTE — Telephone Encounter (Signed)
Pt asking about bronch results. F/u appt is in Sept. Thanks.

## 2016-01-19 NOTE — Telephone Encounter (Signed)
Please call patient with Bronch results

## 2016-01-20 ENCOUNTER — Telehealth: Payer: Self-pay | Admitting: Internal Medicine

## 2016-01-20 NOTE — Telephone Encounter (Signed)
Pt informed of response and that we will call her when rest of results are received. Nothing further needed.

## 2016-01-20 NOTE — Telephone Encounter (Signed)
Please inform patient that her prelim results on BAL cultures are negative.  We will have definitive results in about another week.   Thank you

## 2016-01-21 LAB — VIRUS CULTURE

## 2016-01-23 ENCOUNTER — Ambulatory Visit (INDEPENDENT_AMBULATORY_CARE_PROVIDER_SITE_OTHER): Payer: Medicaid Other | Admitting: Family Medicine

## 2016-01-23 ENCOUNTER — Encounter: Payer: Self-pay | Admitting: Family Medicine

## 2016-01-23 VITALS — BP 122/77 | HR 99 | Temp 98.1°F | Resp 17 | Ht 63.0 in | Wt 208.9 lb

## 2016-01-23 DIAGNOSIS — Z136 Encounter for screening for cardiovascular disorders: Secondary | ICD-10-CM

## 2016-01-23 DIAGNOSIS — IMO0001 Reserved for inherently not codable concepts without codable children: Secondary | ICD-10-CM | POA: Insufficient documentation

## 2016-01-23 NOTE — Progress Notes (Signed)
Name: Kathryn Ramirez   MRN: [id number]    DOB: 12-18-1950   Date:01/23/2016       Progress Note  Subjective  Chief Complaint  Chief Complaint  Patient presents with  . Hypertension    elevated BP    HPI  Pt. Presents for evaluation of elevated blood pressure at the Psychiatrist's office recorderd at 145/100 mmHg. Repeat check was 140/96. Pt. Has no known history of hypertension, is not on any anti-hypertensive medications and today's blood pressure reading is within normal range.   Past Medical History  Diagnosis Date  . Bipolar 1 disorder ([place])   . Tardive dyskinesia   . Rheumatoid arthritis ([place])   . Hypertension   . Lung nodule   . Liver hemangioma   . Acid reflux disease   . Psoriasis     Past Surgical History  Procedure Laterality Date  . Neuroplasty major nerve    . Polypectomy    . Throat surgery    . Flexible bronchoscopy N/A 01/13/2016    Procedure: FLEXIBLE BRONCHOSCOPY;  Surgeon: Vilinda Boehringer, MD;  Location: [facility] ORS;  Service: Cardiopulmonary;  Laterality: N/A;    Family History  Problem Relation Age of Onset  . Cancer Mother     Esophageal Ca  . Asthma Brother   . Breast cancer Neg Hx     Social History   Social History  . Marital Status: Single    Spouse Name: N/A  . Number of Children: N/A  . Years of Education: N/A   Occupational History  . Not on file.   Social History Main Topics  . Smoking status: Current Every Day Smoker    Types: E-cigarettes    Last Attempt to Quit: 12/31/2014  . Smokeless tobacco: Never Used  . Alcohol Use: No     Comment: 03/31/2012 sobierty   . Drug Use: No  . Sexual Activity: No   Other Topics Concern  . Not on file   Social History Narrative     Current outpatient prescriptions:  .  albuterol (PROVENTIL HFA;VENTOLIN HFA) 108 (90 Base) MCG/ACT inhaler, Inhale 1-2 puffs into the lungs every 6 (six) hours as needed for wheezing or shortness of breath. , Disp: , Rfl:  .  ARIPiprazole (ABILIFY) 10  MG tablet, Take 10 mg by mouth daily., Disp: , Rfl:  .  beclomethasone (QVAR) 40 MCG/ACT inhaler, Inhale 1 puff into the lungs 2 (two) times daily., Disp: 1 Inhaler, Rfl: 12 .  buPROPion (WELLBUTRIN XL) 150 MG 24 hr tablet, Take 150 mg by mouth daily., Disp: , Rfl:  .  calcipotriene (DOVONOX) 0.005 % ointment, Apply 1 application topically daily. , Disp: , Rfl:  .  gabapentin (NEURONTIN) 300 MG capsule, Take 300 mg by mouth 2 (two) times daily., Disp: , Rfl:  .  solifenacin (VESICARE) 10 MG tablet, Take 1 tablet (10 mg total) by mouth daily., Disp: 90 tablet, Rfl: 1 .  traMADol (ULTRAM) 50 MG tablet, Take 50 mg by mouth 2 (two) times daily. , Disp: , Rfl:  .  traZODone (DESYREL) 50 MG tablet, Take 50-150 mg by mouth at bedtime. , Disp: , Rfl:  .  diazepam (VALIUM) 5 MG tablet, Take 2.5 mg by mouth 2 (two) times daily. Reported on 01/23/2016, Disp: , Rfl:  .  pantoprazole (PROTONIX) 40 MG tablet, Take 40 mg by mouth daily. Reported on 01/23/2016, Disp: , Rfl:   Allergies  Allergen Reactions  . Codeine Nausea Only  . Haldol [Haloperidol] Other (See  Comments)    Reaction: Pt "felt like her neck was going to snap."  . Haloperidol Lactate Other (See Comments)    Sever neck stiffness.  . Trileptal [Oxcarbazepine] Swelling     Review of Systems  Constitutional: Negative for fever and chills.  Respiratory: Positive for cough and shortness of breath.   Cardiovascular: Negative for chest pain and palpitations.    Objective  Filed Vitals:   01/23/16 0915  BP: 122/77  Pulse: 99  Temp: 98.1 F (36.7 C)  TempSrc: Oral  Resp: 17  Height: 5\' 3"  (1.6 m)  Weight: 208 lb 14.4 oz (94.756 kg)  SpO2: 93%    Physical Exam  Constitutional: She is oriented to person, place, and time and well-developed, well-nourished, and in no distress.  Cardiovascular: Normal rate, regular rhythm and normal heart sounds.   No murmur heard. Pulmonary/Chest: Effort normal and breath sounds normal. She has no  wheezes.  Neurological: She is alert and oriented to person, place, and time.  Nursing note and vitals reviewed.    Assessment & Plan  1. Normal blood pressure Normotensive in our office today, patient reassured. Follow-up periodically    Athalene Kolle [facility] 01/23/2016 9:57 AM

## 2016-01-26 ENCOUNTER — Telehealth: Payer: Self-pay | Admitting: Internal Medicine

## 2016-01-26 NOTE — Telephone Encounter (Signed)
Pt calling about results on Bronc that she did a few weeks ago Please advise.

## 2016-01-26 NOTE — Telephone Encounter (Signed)
Pt informed of results and response. Nothing further needed.

## 2016-01-26 NOTE — Telephone Encounter (Signed)
Please advise of the results or do you want me to bring the pt in for them? Thanks.

## 2016-01-26 NOTE — Telephone Encounter (Signed)
Please inform patient that all culture results (microbiology, fungal, virus) are negative to date from her bronchoscopy. There is no need for antibiotics at this time.  Thank you VM

## 2016-02-03 LAB — CULTURE, FUNGUS WITHOUT SMEAR: Special Requests: NORMAL

## 2016-02-05 ENCOUNTER — Ambulatory Visit (INDEPENDENT_AMBULATORY_CARE_PROVIDER_SITE_OTHER): Payer: Medicaid Other | Admitting: Family Medicine

## 2016-02-05 ENCOUNTER — Encounter: Payer: Self-pay | Admitting: Family Medicine

## 2016-02-05 VITALS — BP 118/70 | HR 96 | Temp 98.6°F | Resp 16 | Ht 63.0 in | Wt 206.1 lb

## 2016-02-05 DIAGNOSIS — R2689 Other abnormalities of gait and mobility: Secondary | ICD-10-CM

## 2016-02-05 DIAGNOSIS — M545 Low back pain, unspecified: Secondary | ICD-10-CM

## 2016-02-05 NOTE — Progress Notes (Signed)
Name: Kathryn Ramirez   MRN: [id number]    DOB: 12-02-1950   Date:02/05/2016       Progress Note  Subjective  Chief Complaint  Chief Complaint  Patient presents with  . Back Pain    Patient fell on Sunday hurt back. Would like referral to Neurologist    Back Pain This is a new problem. The current episode started in the past 7 days. The pain is present in the lumbar spine. The pain is at a severity of 6/10. The symptoms are aggravated by position (getting out of bed, extending her legs etc). She has tried analgesics (On tramadol and tried an arthritis pain reliever which seemed to help.) for the symptoms.   Loss of Balance: Pt. Is here to request referral to Neurology after she has been experiencing loss of balance resulting in a fall last weekend. She was trying to get something from her drawer when she felt her legs got weak and 'I knew I was gonna fall'. She fell to her left side. In the past, she used to see a Neurologist for some balance related issues.     Past Medical History  Diagnosis Date  . Bipolar 1 disorder ([place])   . Tardive dyskinesia   . Rheumatoid arthritis ([place])   . Hypertension   . Lung nodule   . Liver hemangioma   . Acid reflux disease   . Psoriasis     Past Surgical History  Procedure Laterality Date  . Neuroplasty major nerve    . Polypectomy    . Throat surgery    . Flexible bronchoscopy N/A 01/13/2016    Procedure: FLEXIBLE BRONCHOSCOPY;  Surgeon: Vilinda Boehringer, MD;  Location: [facility] ORS;  Service: Cardiopulmonary;  Laterality: N/A;    Family History  Problem Relation Age of Onset  . Cancer Mother     Esophageal Ca  . Asthma Brother   . Breast cancer Neg Hx     Social History   Social History  . Marital Status: Single    Spouse Name: N/A  . Number of Children: N/A  . Years of Education: N/A   Occupational History  . Not on file.   Social History Main Topics  . Smoking status: Current Every Day Smoker    Types: E-cigarettes   Last Attempt to Quit: 12/31/2014  . Smokeless tobacco: Never Used  . Alcohol Use: No     Comment: 03/31/2012 sobierty   . Drug Use: No  . Sexual Activity: No   Other Topics Concern  . Not on file   Social History Narrative     Current outpatient prescriptions:  .  albuterol (PROVENTIL HFA;VENTOLIN HFA) 108 (90 Base) MCG/ACT inhaler, Inhale 1-2 puffs into the lungs every 6 (six) hours as needed for wheezing or shortness of breath. , Disp: , Rfl:  .  ARIPiprazole (ABILIFY) 10 MG tablet, Take 10 mg by mouth daily., Disp: , Rfl:  .  beclomethasone (QVAR) 40 MCG/ACT inhaler, Inhale 1 puff into the lungs 2 (two) times daily., Disp: 1 Inhaler, Rfl: 12 .  buPROPion (WELLBUTRIN XL) 150 MG 24 hr tablet, Take 150 mg by mouth daily., Disp: , Rfl:  .  calcipotriene (DOVONOX) 0.005 % ointment, Apply 1 application topically daily. , Disp: , Rfl:  .  gabapentin (NEURONTIN) 300 MG capsule, Take 300 mg by mouth 2 (two) times daily., Disp: , Rfl:  .  pantoprazole (PROTONIX) 40 MG tablet, Take 40 mg by mouth daily. Reported on 01/23/2016, Disp: , Rfl:  .  solifenacin (VESICARE) 10 MG tablet, Take 1 tablet (10 mg total) by mouth daily., Disp: 90 tablet, Rfl: 1 .  traMADol (ULTRAM) 50 MG tablet, Take 50 mg by mouth 2 (two) times daily. , Disp: , Rfl:  .  traZODone (DESYREL) 50 MG tablet, Take 50-150 mg by mouth at bedtime. , Disp: , Rfl:  .  diazepam (VALIUM) 5 MG tablet, Take 2.5 mg by mouth 2 (two) times daily. Reported on 02/05/2016, Disp: , Rfl:   Allergies  Allergen Reactions  . Codeine Nausea Only  . Haldol [Haloperidol] Other (See Comments)    Reaction: Pt "felt like her neck was going to snap."  . Haloperidol Lactate Other (See Comments)    Sever neck stiffness.  . Trileptal [Oxcarbazepine] Swelling     Review of Systems  Musculoskeletal: Positive for back pain.  Neurological: Negative for dizziness.    Objective  Filed Vitals:   02/05/16 1449  BP: 118/70  Pulse: 96  Temp: 98.6 F (37  C)  TempSrc: Oral  Resp: 16  Height: 5\' 3"  (1.6 m)  Weight: 206 lb 1.6 oz (93.486 kg)  SpO2: 97%    Physical Exam  Constitutional: She is oriented to person, place, and time and well-developed, well-nourished, and in no distress.  Cardiovascular: Normal rate, regular rhythm and normal heart sounds.   No murmur heard. Pulmonary/Chest: Effort normal and breath sounds normal. She has no wheezes.  Musculoskeletal:       Lumbar back: She exhibits tenderness, pain and spasm.       Back:  Neurological: She is alert and oriented to person, place, and time.  Psychiatric: Mood, memory, affect and judgment normal.  Nursing note and vitals reviewed.      Assessment & Plan  1. Impairment of balance We'll refer to neurology to establish and continue care - Ambulatory referral to Neurology  2. Acute right-sided low back pain without sciatica Likely muscle spasm from the trauma. No imaging indicated. Advised heating pads.   Kathryn Ramirez [facility] 02/05/2016 2:55 PM

## 2016-02-06 LAB — ACID FAST SMEAR (AFB, MYCOBACTERIA)

## 2016-02-06 LAB — ACID FAST SMEAR (AFB): ACID FAST SMEAR - AFSCU2: NEGATIVE

## 2016-02-16 ENCOUNTER — Telehealth: Payer: Self-pay | Admitting: Family Medicine

## 2016-02-16 DIAGNOSIS — J4540 Moderate persistent asthma, uncomplicated: Secondary | ICD-10-CM

## 2016-02-18 MED ORDER — ALBUTEROL SULFATE HFA 108 (90 BASE) MCG/ACT IN AERS: 1.0000 | INHALATION_SPRAY | Freq: Four times a day (QID) | RESPIRATORY_TRACT | 2 refills | Status: DC | PRN

## 2016-02-18 NOTE — Telephone Encounter (Signed)
Prescription for Ventolin inhaler has been sent to pharmacy

## 2016-02-26 LAB — ACID FAST CULTURE WITH REFLEXED SENSITIVITIES

## 2016-02-26 LAB — ACID FAST CULTURE WITH REFLEXED SENSITIVITIES (MYCOBACTERIA): Acid Fast Culture: NEGATIVE

## 2016-03-25 DIAGNOSIS — G8929 Other chronic pain: Secondary | ICD-10-CM | POA: Insufficient documentation

## 2016-03-25 DIAGNOSIS — R278 Other lack of coordination: Secondary | ICD-10-CM | POA: Insufficient documentation

## 2016-03-25 DIAGNOSIS — G620 Drug-induced polyneuropathy: Secondary | ICD-10-CM | POA: Insufficient documentation

## 2016-03-25 DIAGNOSIS — M25532 Pain in left wrist: Secondary | ICD-10-CM

## 2016-04-05 ENCOUNTER — Ambulatory Visit: Payer: Medicaid Other | Admitting: Internal Medicine

## 2016-04-06 ENCOUNTER — Encounter: Payer: Self-pay | Admitting: Family Medicine

## 2016-04-06 ENCOUNTER — Ambulatory Visit (INDEPENDENT_AMBULATORY_CARE_PROVIDER_SITE_OTHER): Payer: Medicaid Other | Admitting: Family Medicine

## 2016-04-06 VITALS — BP 121/66 | HR 82 | Temp 99.1°F | Resp 15 | Ht 63.0 in | Wt 205.0 lb

## 2016-04-06 DIAGNOSIS — R079 Chest pain, unspecified: Secondary | ICD-10-CM | POA: Diagnosis not present

## 2016-04-06 DIAGNOSIS — IMO0001 Reserved for inherently not codable concepts without codable children: Secondary | ICD-10-CM

## 2016-04-06 DIAGNOSIS — Z136 Encounter for screening for cardiovascular disorders: Secondary | ICD-10-CM | POA: Diagnosis not present

## 2016-04-06 NOTE — Progress Notes (Signed)
Name: Kathryn Ramirez   MRN: [id number]    DOB: 09-07-1950   Date:04/06/2016       Progress Note  Subjective  Chief Complaint  Chief Complaint  Patient presents with  . Hypertension    HPI  Pt. Presents for Blood Pressure follow up, she states her blood pressure at the Neurologist's office and at the hospital was in the 0000000 diastolic, (she is not sure how high was the systolic). She believes she has high blood pressure. On review of her chart, he blood pressure has been in 115-125/65-75 mmHg, which is normal.  Past Medical History:  Diagnosis Date  . Acid reflux disease   . Bipolar 1 disorder ([place])   . Hypertension   . Liver hemangioma   . Lung nodule   . Psoriasis   . Rheumatoid arthritis ([place])   . Tardive dyskinesia     Past Surgical History:  Procedure Laterality Date  . FLEXIBLE BRONCHOSCOPY N/A 01/13/2016   Procedure: FLEXIBLE BRONCHOSCOPY;  Surgeon: Vilinda Boehringer, MD;  Location: [facility] ORS;  Service: Cardiopulmonary;  Laterality: N/A;  . NEUROPLASTY MAJOR NERVE    . POLYPECTOMY    . THROAT SURGERY      Family History  Problem Relation Age of Onset  . Cancer Mother     Esophageal Ca  . Asthma Brother   . Breast cancer Neg Hx     Social History   Social History  . Marital status: Single    Spouse name: N/A  . Number of children: N/A  . Years of education: N/A   Occupational History  . Not on file.   Social History Main Topics  . Smoking status: Current Every Day Smoker    Types: E-cigarettes    Last attempt to quit: 12/31/2014  . Smokeless tobacco: Never Used  . Alcohol use No     Comment: 03/31/2012 sobierty   . Drug use: No  . Sexual activity: No   Other Topics Concern  . Not on file   Social History Narrative  . No narrative on file     Current Outpatient Prescriptions:  .  albuterol (PROVENTIL HFA;VENTOLIN HFA) 108 (90 Base) MCG/ACT inhaler, Inhale 1-2 puffs into the lungs every 6 (six) hours as needed for wheezing or shortness of  breath., Disp: 1 Inhaler, Rfl: 2 .  ARIPiprazole (ABILIFY) 10 MG tablet, Take 10 mg by mouth daily., Disp: , Rfl:  .  beclomethasone (QVAR) 40 MCG/ACT inhaler, Inhale 1 puff into the lungs 2 (two) times daily., Disp: 1 Inhaler, Rfl: 12 .  buPROPion (WELLBUTRIN XL) 150 MG 24 hr tablet, Take 150 mg by mouth daily., Disp: , Rfl:  .  gabapentin (NEURONTIN) 300 MG capsule, Take 300 mg by mouth 2 (two) times daily., Disp: , Rfl:  .  pantoprazole (PROTONIX) 40 MG tablet, Take 40 mg by mouth daily. Reported on 01/23/2016, Disp: , Rfl:  .  solifenacin (VESICARE) 10 MG tablet, Take 1 tablet (10 mg total) by mouth daily., Disp: 90 tablet, Rfl: 1 .  traMADol (ULTRAM) 50 MG tablet, Take 50 mg by mouth 2 (two) times daily. , Disp: , Rfl:  .  traZODone (DESYREL) 50 MG tablet, Take 50-150 mg by mouth at bedtime. , Disp: , Rfl:  .  diazepam (VALIUM) 5 MG tablet, Take 2.5 mg by mouth 2 (two) times daily. Reported on 02/05/2016, Disp: , Rfl:   Allergies  Allergen Reactions  . Codeine Nausea Only  . Haldol [Haloperidol] Other (See Comments)  Reaction: Pt "felt like her neck was going to snap."  . Haloperidol Lactate Other (See Comments)    Sever neck stiffness.  . Trileptal [Oxcarbazepine] Swelling     Review of Systems  Constitutional: Negative for chills and fever.  Eyes: Negative for blurred vision and double vision.  Cardiovascular: Negative for chest pain (intermittent chest pain, last episode 2 days ago.) and leg swelling.  Neurological: Positive for dizziness. Negative for headaches.  Psychiatric/Behavioral: The patient is nervous/anxious.     Objective  Vitals:   04/06/16 1334  BP: 121/66  Pulse: 82  Resp: 15  Temp: 99.1 F (37.3 C)  TempSrc: Oral  SpO2: 94%  Weight: 205 lb (93 kg)  Height: 5\' 3"  (1.6 m)    Physical Exam  Constitutional: She is oriented to person, place, and time and well-developed, well-nourished, and in no distress.  HENT:  Head: Normocephalic and atraumatic.   Cardiovascular: Normal rate, regular rhythm and normal heart sounds.   No murmur heard. Pulmonary/Chest: Effort normal and breath sounds normal.  Neurological: She is alert and oriented to person, place, and time.  Nursing note and vitals reviewed.     Assessment & Plan  1. Normal blood pressure BP normal, advised to keep blood pressure logs for review  2. Chest pain, unspecified chest pain type EKG shows no concerning findings, referral to cardiology for workup of chest pain - EKG 12-Lead - Ambulatory referral to Cardiology   [facility] 04/06/2016 2:06 PM

## 2016-04-22 ENCOUNTER — Ambulatory Visit: Payer: Medicaid Other | Admitting: Internal Medicine

## 2016-04-23 ENCOUNTER — Other Ambulatory Visit: Payer: Self-pay | Admitting: Family Medicine

## 2016-04-23 DIAGNOSIS — N3281 Overactive bladder: Secondary | ICD-10-CM

## 2016-04-26 ENCOUNTER — Ambulatory Visit: Payer: Medicaid Other | Admitting: Internal Medicine

## 2016-05-03 ENCOUNTER — Encounter: Payer: Self-pay | Admitting: Cardiovascular Disease

## 2016-05-03 ENCOUNTER — Ambulatory Visit (INDEPENDENT_AMBULATORY_CARE_PROVIDER_SITE_OTHER): Payer: Medicaid Other | Admitting: Cardiovascular Disease

## 2016-05-03 VITALS — BP 130/82 | HR 88 | Ht 63.0 in | Wt 206.8 lb

## 2016-05-03 DIAGNOSIS — R079 Chest pain, unspecified: Secondary | ICD-10-CM | POA: Diagnosis not present

## 2016-05-03 NOTE — Patient Instructions (Addendum)
Medication Instructions:  Your physician recommends that you continue on your current medications as directed. Please refer to the Current Medication list given to you today.   Labwork: none  Testing/Procedures: Your physician has requested that you have a lexiscan myoview. For further information please visit [email or web address]. Please follow instruction sheet, as given.  [place]  Your caregiver has ordered a Stress Test with nuclear imaging. The purpose of this test is to evaluate the blood supply to your heart muscle. This procedure is referred to as a "Non-Invasive Stress Test." This is because other than having an IV started in your vein, nothing is inserted or "invades" your body. Cardiac stress tests are done to find areas of poor blood flow to the heart by determining the extent of coronary artery disease (CAD). Some patients exercise on a treadmill, which naturally increases the blood flow to your heart, while others who are  unable to walk on a treadmill due to physical limitations have a pharmacologic/chemical stress agent called Lexiscan . This medicine will mimic walking on a treadmill by temporarily increasing your coronary blood flow.   Please note: these test may take anywhere between 2-4 hours to complete  PLEASE REPORT TO [facility] AT THE FIRST DESK WILL DIRECT YOU WHERE TO GO  Date of Procedure:__Wed, Oct 18_  Arrival Time for Procedure:___8:15am__  Instructions regarding medication:   You may take your morning medications with a sip of water.  Please bring your inhalers with you to your test.  PLEASE NOTIFY THE OFFICE AT LEAST 24 HOURS IN ADVANCE IF YOU ARE UNABLE TO KEEP YOUR APPOINTMENT.  [phone] AND  PLEASE NOTIFY NUCLEAR MEDICINE AT [facility] AT LEAST 24 HOURS IN ADVANCE IF YOU ARE UNABLE TO KEEP YOUR APPOINTMENT. [phone]  How to prepare for your Myoview test:  1. Do not eat or drink after midnight 2. No caffeine  for 24 hours prior to test 3. No smoking 24 hours prior to test. 4. Your medication may be taken with water.  If your doctor stopped a medication because of this test, do not take that medication. 5. Ladies, please do not wear dresses.  Skirts or pants are appropriate. Please wear a short sleeve shirt. 6. No perfume, cologne or lotion. 7. Wear comfortable walking shoes. No heels!            Follow-Up: Your physician recommends that you schedule a follow-up appointment as needed.    Any Other Special Instructions Will Be Listed Below (If Applicable).     If you need a refill on your cardiac medications before your next appointment, please call your pharmacy.  Cardiac Nuclear Scanning A cardiac nuclear scan is used to check your heart for problems, such as the following:  A portion of the heart is not getting enough blood.  Part of the heart muscle has died, which happens with a heart attack.  The heart wall is not working normally.  In this test, a radioactive dye (tracer) is injected into your bloodstream. After the tracer has traveled to your heart, a scanning device is used to measure how much of the tracer is absorbed by or distributed to various areas of your heart. LET [facility] CARE PROVIDER KNOW ABOUT:  Any allergies you have.  All medicines you are taking, including vitamins, herbs, eye drops, creams, and over-the-counter medicines.  Previous problems you or members of your family have had with the use of anesthetics.  Any blood disorders you have.  Previous surgeries you have had.  Medical conditions you have.  RISKS AND COMPLICATIONS Generally, this is a safe procedure. However, as with any procedure, problems can occur. Possible problems include:   Serious chest pain.  Rapid heartbeat.  Sensation of warmth in your chest. This usually passes quickly. BEFORE THE PROCEDURE Ask your health care provider about changing or stopping your regular  medicines. PROCEDURE This procedure is usually done at a hospital and takes 2-4 hours.  An IV tube is inserted into one of your veins.  Your health care provider will inject a small amount of radioactive tracer through the tube.  You will then wait for 20-40 minutes while the tracer travels through your bloodstream.  You will lie down on an exam table so images of your heart can be taken. Images will be taken for about 15-20 minutes.  You will exercise on a treadmill or stationary bike. While you exercise, your heart activity will be monitored with an electrocardiogram (ECG), and your blood pressure will be checked.  If you are unable to exercise, you may be given a medicine to make your heart beat faster.  When blood flow to your heart has peaked, tracer will again be injected through the IV tube.  After 20-40 minutes, you will get back on the exam table and have more images taken of your heart.  When the procedure is over, your IV tube will be removed. AFTER THE PROCEDURE  You will likely be able to leave shortly after the test. Unless your health care provider tells you otherwise, you may return to your normal schedule, including diet, activities, and medicines.  Make sure you find out how and when you will get your test results.   This information is not intended to replace advice given to you by your health care provider. Make sure you discuss any questions you have with your health care provider.   Document Released: 08/06/2004 Document Revised: 07/17/2013 Document Reviewed: 06/20/2013 Elsevier Interactive Patient Education [employer].

## 2016-05-03 NOTE — Progress Notes (Signed)
Cardiology Office Note   Date:  05/03/2016   ID:  Kathryn Ramirez, DOB [birth year], MRN [id number]  PCP:  Keith Rake, MD  Cardiologist:   Kathlyn Sacramento, MD   Chief Complaint  Patient presents with  . other    Ref by Dr. Manuella Ghazi for chest pain. Meds reviewed by the pt. verbally. Pt. c/o chest pain today.       History of Present Illness: Kathryn Ramirez is a 65 y.o. female who Was referred by Dr. Manuella Ghazi for evaluation of chest pain. The patient reports being told in the past about palpitations due to "mitral valve prolapse"and was placed in the past on metoprolol. She is not diabetic And has no history of hyperlipidemia. She is a previous smoker of one pack per day for 30 years but quit 6 months ago. She has no family history of coronary artery disease. She describes intermittent episodes of substernal chest pain described as indigestion and heartburn which can happen at rest or with exertion. It does not seem to be related to food intake. There is no radiation. The symptoms are overall mild. She does report known history of GERD but takes Protonix on a regular basis. She has mild exertional dyspnea without orthopnea, PND or leg edema.    Past Medical History:  Diagnosis Date  . Acid reflux disease   . Bipolar 1 disorder ([place])   . Liver hemangioma   . Lung nodule   . Psoriasis   . Rheumatoid arthritis ([place])   . Tardive dyskinesia     Past Surgical History:  Procedure Laterality Date  . FLEXIBLE BRONCHOSCOPY N/A 01/13/2016   Procedure: FLEXIBLE BRONCHOSCOPY;  Surgeon: Vilinda Boehringer, MD;  Location: [facility] ORS;  Service: Cardiopulmonary;  Laterality: N/A;  . NEUROPLASTY MAJOR NERVE    . POLYPECTOMY    . THROAT SURGERY       Current Outpatient Prescriptions  Medication Sig Dispense Refill  . albuterol (PROVENTIL HFA;VENTOLIN HFA) 108 (90 Base) MCG/ACT inhaler Inhale 1-2 puffs into the lungs every 6 (six) hours as needed for wheezing or shortness of breath. 1 Inhaler 2  .  ARIPiprazole (ABILIFY) 10 MG tablet Take 10 mg by mouth daily.    . beclomethasone (QVAR) 40 MCG/ACT inhaler Inhale 1 puff into the lungs 2 (two) times daily. 1 Inhaler 12  . buPROPion (WELLBUTRIN XL) 150 MG 24 hr tablet Take 150 mg by mouth daily.    [name] gabapentin (NEURONTIN) 300 MG capsule Take 300 mg by mouth 2 (two) times daily.    . pantoprazole (PROTONIX) 40 MG tablet Take 40 mg by mouth daily. Reported on 01/23/2016    . traMADol (ULTRAM) 50 MG tablet Take 50 mg by mouth 2 (two) times daily.     . traZODone (DESYREL) 50 MG tablet Take 50-150 mg by mouth at bedtime.     . VESICARE 10 MG tablet TAKE 1 TABLET BY MOUTH DAILY 30 tablet 2   No current facility-administered medications for this visit.     Allergies:   Codeine; Haldol [haloperidol]; Haloperidol lactate; and Trileptal [oxcarbazepine]    Social History:  The patient  reports that she has been smoking E-cigarettes.  She has never used smokeless tobacco. She reports that she does not drink alcohol or use drugs.   Family History:  The patient's family history includes Asthma in her brother; Cancer in her mother.    ROS:  Please see the history of present illness.   Otherwise, review of systems are  positive for none.   All other systems are reviewed and negative.    PHYSICAL EXAM: VS:  BP 130/82 (BP Location: Right Arm, Patient Position: Sitting, Cuff Size: Normal)   Pulse 88   Ht 5\' 3"  (1.6 m)   Wt 206 lb 12 oz (93.8 kg)   BMI 36.62 kg/m  , BMI Body mass index is 36.62 kg/m. GEN: Well nourished, well developed, in no acute distress  HEENT: normal  Neck: no JVD, carotid bruits, or masses Cardiac: RRR; no murmurs, rubs, or gallops,no edema  Respiratory:  clear to auscultation bilaterally, normal work of breathing GI: soft, nontender, nondistended, + BS MS: no deformity or atrophy  Skin: warm and dry, no rash Neuro:  Strength and sensation are intact Psych: euthymic mood, full affect  distal pulses are normal.   EKG:   EKG is ordered today. The ekg ordered today demonstrates normal sinus rhythm with left ventricular hypertrophy. No significant ST or T wave changes.    Recent Labs: 07/03/2015: ALT 19 07/31/2015: BUN 13; Hemoglobin 12.8; Platelets 266; Potassium 4.2; Sodium 136 12/10/2015: Creatinine, Ser 0.80    Lipid Panel No results found for: CHOL, TRIG, HDL, CHOLHDL, VLDL, LDLCALC, LDLDIRECT    Wt Readings from Last 3 Encounters:  05/03/16 206 lb 12 oz (93.8 kg)  04/06/16 205 lb (93 kg)  02/05/16 206 lb 1.6 oz (93.5 kg)      PAD Screen 05/03/2016  Previous PAD dx? No  Previous surgical procedure? No  Pain with walking? No  Feet/toe relief with dangling? No  Painful, non-healing ulcers? No  Extremities discolored? No      ASSESSMENT AND PLAN:  1.  Atypical chest pain with risk factors for coronary artery disease: Slightly abnormal EKG with left ventricular hypertrophy. I recommend evaluation with a treadmill nuclear stress test. Given that her EKG is slightly abnormal, I don't think a treadmill stress test alone is going to be sufficient.  Continue treatment of risk factors. If cardiac workup is negative, consider referral to GI.    2. Elevated blood pressure: Blood pressure is normal today. Continue to monitor.   Disposition:   FU with me as needed.  Signed,  Kathlyn Sacramento, MD  05/03/2016 1:58 PM    [facility]

## 2016-05-11 ENCOUNTER — Telehealth: Payer: Self-pay | Admitting: Cardiovascular Disease

## 2016-05-11 NOTE — Telephone Encounter (Signed)
Reviewed myoview instructions w/pt who verbalized understanding.  

## 2016-05-12 ENCOUNTER — Encounter
Admission: RE | Admit: 2016-05-12 | Discharge: 2016-05-12 | Disposition: A | Discharge status: Home / Self Care | Payer: Medicaid Other | Source: Ambulatory Visit | Attending: Cardiovascular Disease | Admitting: Cardiovascular Disease

## 2016-05-12 DIAGNOSIS — R079 Chest pain, unspecified: Secondary | ICD-10-CM | POA: Diagnosis present

## 2016-05-12 LAB — NM MYOCAR MULTI W/SPECT W/WALL MOTION / EF
CHL CUP NUCLEAR SSS: 5
LV dias vol: 59 mL (ref 46–106)
LV sys vol: 18 mL
NUC STRESS TID: 1.29
Peak HR: 90 {beats}/min
Percent HR: 57 %
Rest HR: 75 {beats}/min
SDS: 3
SRS: 2

## 2016-05-12 MED ORDER — TECHNETIUM TC 99M TETROFOSMIN IV KIT
12.8500 | PACK | Freq: Once | INTRAVENOUS | Status: AC | PRN
  Administered 2016-05-12: 12.85 via INTRAVENOUS

## 2016-05-12 MED ORDER — REGADENOSON 0.4 MG/5ML IV SOLN
0.4000 mg | Freq: Once | INTRAVENOUS | Status: AC
  Administered 2016-05-12: 0.4 mg via INTRAVENOUS

## 2016-05-12 MED ORDER — TECHNETIUM TC 99M TETROFOSMIN IV KIT
30.3730 | PACK | Freq: Once | INTRAVENOUS | Status: AC | PRN
  Administered 2016-05-12: 30.373 via INTRAVENOUS

## 2016-05-17 ENCOUNTER — Encounter: Payer: Self-pay | Admitting: Internal Medicine

## 2016-05-17 ENCOUNTER — Ambulatory Visit (INDEPENDENT_AMBULATORY_CARE_PROVIDER_SITE_OTHER): Payer: Medicaid Other | Admitting: Internal Medicine

## 2016-05-17 ENCOUNTER — Telehealth: Payer: Self-pay

## 2016-05-17 VITALS — BP 128/84 | HR 96 | Ht 64.0 in | Wt 206.0 lb

## 2016-05-17 DIAGNOSIS — R053 Chronic cough: Secondary | ICD-10-CM

## 2016-05-17 DIAGNOSIS — Z23 Encounter for immunization: Secondary | ICD-10-CM

## 2016-05-17 DIAGNOSIS — R05 Cough: Secondary | ICD-10-CM

## 2016-05-17 DIAGNOSIS — J381 Polyp of vocal cord and larynx: Secondary | ICD-10-CM

## 2016-05-17 DIAGNOSIS — J4530 Mild persistent asthma, uncomplicated: Secondary | ICD-10-CM

## 2016-05-17 NOTE — Assessment & Plan Note (Signed)
Multifactorial: Mild intermittent asthma, history of right vocal cord polyp, vocal cord scarring.  Associated cough and voice changes have occurred since her C-spine surgery. I suspect during the intubation there may have been some mild irritation of the vocal cords, she has underlying vocal cord pathology including a right vocal cord polyp removal in 2013 along with scarring that was followed closely by [facility], as of 2016. She has since stopped going to [facility] due to distance and transportation issues. Patient is requesting any further ENT evaluation be done locally.  Plan: -Referral to ENT given history of vocal cord polyp along with scarring on the right.

## 2016-05-17 NOTE — Patient Instructions (Signed)
Follow up with Dr. Alva Garnet in 3 months - cont with current inhalers - avoid all forms of smoking - Flu Shot today - ENT appointment for hx of Vocal cord polyp and Right Vocal cord scarring.

## 2016-05-17 NOTE — Progress Notes (Signed)
[facility] Consultation      MRN# [id number] Kathryn Ramirez [birth year]   CC: Chief Complaint  Patient presents with  . Follow-up    SOB w/exertion; chest tightness at times; no cough      Brief History: Patient is a pleasant 65 year old female past medical history of bipolar 1, tardive dyskinesia, rheumatoid arthritis, hypertension, multiple pulmonary nodules, asthma, acid reflux disease, obesity, seen in consultation for pulmonary nodules. History per patient, she states that she was diagnosed with pulmonary nodules about 2 years ago at [facility], has had 2 subsequent CT scans with stable nodules, and wanted to follow-up with a local pulmonary physician. She denies any significant weight loss, night sweats, fevers, chills. She has a history of asthma, today she admits to shortness of breath and dry cough, this is been going on for about the last week, she also states that she's been using pro-air 2-3 times per day since last Monday. She last had PFTs and 2015 at [facility]. Past surgical history includes thyroid nodule removal, and left knee surgery area She is a former smoker quit in August 2016, previously smoked one pack per day for 30 years. She previously worked as a Psychologist, sport and exercise, has since retired. Patient states during allergy seasons especially spring and late fall her asthma "acts up". Typical asthma symptoms are increased shortness of breath, dry cough and nasal congestion. Hx of Right Vocal cord polyp removal at [facility] [place], [place], 2013. Now with Right VC scarring. Followed previously by [facility]. Had a bronchoscopy 01/13/16 due to chronic cough - negative BAL  Events since last clinic visit: Patient presents today for follow-up visit of chronic cough along with asthma. She states that she has stopped using vapors since her last visit.  Overall with stable sob and chronic cough.  Today she is admitted to intermittent shortness of  breath, cough, intermittent wheezing. Overall she feels the Qvar is helping her. Had Cspine surgery 02/16/16, since then has had "raspy" voice, that has been slow to return to baseline.     Medication:    Current Outpatient Prescriptions:  .  albuterol (PROVENTIL HFA;VENTOLIN HFA) 108 (90 Base) MCG/ACT inhaler, Inhale 1-2 puffs into the lungs every 6 (six) hours as needed for wheezing or shortness of breath., Disp: 1 Inhaler, Rfl: 2 .  ARIPiprazole (ABILIFY) 10 MG tablet, Take 10 mg by mouth daily., Disp: , Rfl:  .  beclomethasone (QVAR) 40 MCG/ACT inhaler, Inhale 1 puff into the lungs 2 (two) times daily., Disp: 1 Inhaler, Rfl: 12 .  buPROPion (WELLBUTRIN XL) 150 MG 24 hr tablet, Take 150 mg by mouth daily., Disp: , Rfl:  .  gabapentin (NEURONTIN) 300 MG capsule, Take 300 mg by mouth 2 (two) times daily., Disp: , Rfl:  .  pantoprazole (PROTONIX) 40 MG tablet, Take 40 mg by mouth daily. Reported on 01/23/2016, Disp: , Rfl:  .  traMADol (ULTRAM) 50 MG tablet, Take 50 mg by mouth 2 (two) times daily. , Disp: , Rfl:  .  traZODone (DESYREL) 50 MG tablet, Take 50-150 mg by mouth at bedtime. , Disp: , Rfl:  .  VESICARE 10 MG tablet, TAKE 1 TABLET BY MOUTH DAILY, Disp: 30 tablet, Rfl: 2    Review of Systems  Constitutional: Negative for chills and fever.  Eyes: Negative for blurred vision.  Respiratory: Positive for cough, sputum production, shortness of breath and wheezing.   Cardiovascular: Negative for chest pain.  Gastrointestinal: Negative for heartburn, nausea and vomiting.  Musculoskeletal: Negative for myalgias.  Skin: Negative for rash.  Neurological: Negative for headaches.  Endo/Heme/Allergies: Does not bruise/bleed easily.      Allergies:  Codeine; Haldol [haloperidol]; Haloperidol lactate; and Trileptal [oxcarbazepine]  Physical Examination:  VS: BP 128/84 (BP Location: Left Arm, Cuff Size: Normal)   Pulse 96   Ht 5\' 4"  (1.626 m)   Wt 206 lb (93.4 kg)   SpO2 94%   BMI  35.36 kg/m   General Appearance: No distress  HEENT: PERRLA, no ptosis, no other lesions noticed Pulmonary:normal breath sounds., diaphragmatic excursion normal.No wheezing, No rales   Cardiovascular:  Normal S1,S2.  No m/r/g.     Abdomen:Exam: Benign, Soft, non-tender, No masses  Skin:   warm, no rashes, no ecchymosis  Extremities: normal, no cyanosis, clubbing, warm with normal capillary refill.    PFTS 12/10/15 FEV1 81% FEV1/FVC 85% RV 127% TLC 99% ERV 13% DLCO uncorrected 120% Impression: Normal flow loops, no significant obstruction, no response to bronchodilators. Mild air trapping. 6 minute walk tests 12/10/2015-lowest saturation 93%, highest heart rate 101 bpm, total distance 264 m/866 feet.  Rad results: (The following images and results were reviewed by Dr. Stevenson Clinch on 05/17/2016). CT CHEST WITH CONTRAST 11/2015  TECHNIQUE: Multidetector CT imaging of the chest was performed during intravenous contrast administration.  CONTRAST: 78mL ISOVUE-300 IOPAMIDOL (ISOVUE-300) INJECTION 61%  COMPARISON: Chest x-ray of 09/01/2015 and 07/31/2015  FINDINGS: Linear opacities in primarily of the lingula and right middle lobe and to lesser degree anterior left lower lobe are most consistent with scarring. There is mild bronchiectatic change medially within the right middle lobe most likely due to prior infection and scarring. However, no active parenchymal process is seen. No pleural effusion is noted. No suspicious lung nodule is seen. The central airway is patent. The thoracic vertebrae are in normal alignment with mild degenerative change present. No compression deformity is seen.  On soft tissue window images the thyroid gland is unremarkable. The thoracic aorta opacifies with no significant abnormality noted. The pulmonary arteries also opacify with no central abnormality. No mediastinal or hilar adenopathy is seen. The liver appears somewhat low in attenuation which  may indicate fatty infiltration. In addition multiple faintly calcified gallstones layer dependently within the gallbladder. No gallbladder wall thickening is seen.  IMPRESSION: 1. Linear scarring primarily in the right middle lobe and lingula with bronchiectatic change in the right middle lobe consistent with prior infection. No definite active process is seen and no effusion is noted. 2. Multiple gallstones within the gallbladder. 3. Suspect fatty infiltration of the liver.  PFTs 12/10/15 FEV1 1% FEV1/FVC 85% RV 127% TLC 99% RV/TLC 120% DLCO corrected 128% Phillips with normal appearance Impression: No center can obstruction, no response to bronchodilator, normal flow loops, mild air trapping. 6 minute walk test lowest saturation 93%, highest heart rate 101, total distance 866 feet/264 m   Assessment and Plan: 65 year old female seen for chronic cough and asthma follow up Chronic cough Multifactorial: Mild intermittent asthma, history of right vocal cord polyp, vocal cord scarring.  Associated cough and voice changes have occurred since her C-spine surgery. I suspect during the intubation there may have been some mild irritation of the vocal cords, she has underlying vocal cord pathology including a right vocal cord polyp removal in 2013 along with scarring that was followed closely by [facility], as of 2016. She has since stopped going to [facility] due to distance and transportation issues. Patient is requesting any further ENT evaluation be done locally.  Plan: -Referral to ENT given history of vocal cord polyp along with scarring on the right.  Asthma Overall well controlled except for the last week also due to possible environmental changes, rapid weather changes.  Associated voice changes since her recent C-spine surgery. She has also stopped using electronic cigarettes and vapors. Educated patient on the use of a rescue inhaler, and the use of a maintenance inhaler. PFTs -  Impression: Normal flow loops, no significant obstruction, no response to bronchodilators. Mild air trapping.  Plan: - Qvar 38mcg, 1 puff BID, -gargle and rinse after each use. - continue to avoid tobacco - Avoidance of ecigs/vaping      Updated Medication List Outpatient Encounter Prescriptions as of 05/17/2016  Medication Sig  . albuterol (PROVENTIL HFA;VENTOLIN HFA) 108 (90 Base) MCG/ACT inhaler Inhale 1-2 puffs into the lungs every 6 (six) hours as needed for wheezing or shortness of breath.  . ARIPiprazole (ABILIFY) 10 MG tablet Take 10 mg by mouth daily.  . beclomethasone (QVAR) 40 MCG/ACT inhaler Inhale 1 puff into the lungs 2 (two) times daily.  [name] buPROPion (WELLBUTRIN XL) 150 MG 24 hr tablet Take 150 mg by mouth daily.  [name] gabapentin (NEURONTIN) 300 MG capsule Take 300 mg by mouth 2 (two) times daily.  . pantoprazole (PROTONIX) 40 MG tablet Take 40 mg by mouth daily. Reported on 01/23/2016  . traMADol (ULTRAM) 50 MG tablet Take 50 mg by mouth 2 (two) times daily.   . traZODone (DESYREL) 50 MG tablet Take 50-150 mg by mouth at bedtime.   . VESICARE 10 MG tablet TAKE 1 TABLET BY MOUTH DAILY   No facility-administered encounter medications on file as of 05/17/2016.     Orders for this visit: Orders Placed This Encounter  Procedures  . Flu Vaccine QUAD 36+ mos IM  . Ambulatory referral to ENT    Referral Priority:   Routine    Referral Type:   Consultation    Referral Reason:   Specialty Services Required    Requested Specialty:   Otolaryngology    Number of Visits Requested:   1    Thank  you for the visitation and for allowing  [facility] to assist in the care of your patient. Our recommendations are noted above.  Please contact us if we can be of further service.  Vilinda Boehringer, MD [facility] Office Number: [phone]  Note: This note was prepared with Dragon dictation along with smaller phrase technology. Any  transcriptional errors that result from this process are unintentional.

## 2016-05-17 NOTE — Assessment & Plan Note (Signed)
Overall well controlled except for the last week also due to possible environmental changes, rapid weather changes.  Associated voice changes since her recent C-spine surgery. She has also stopped using electronic cigarettes and vapors. Educated patient on the use of a rescue inhaler, and the use of a maintenance inhaler. PFTs - Impression: Normal flow loops, no significant obstruction, no response to bronchodilators. Mild air trapping.  Plan: - Qvar 31mcg, 1 puff BID, -gargle and rinse after each use. - continue to avoid tobacco - Avoidance of ecigs/vaping

## 2016-05-17 NOTE — Telephone Encounter (Signed)
Referral to ENT is entered.

## 2016-05-17 NOTE — Telephone Encounter (Signed)
I got a call from [facility] with  [facility] stating that Dr. Stevenson Clinch wants this patient to be referred to ENT for vocal cord poly and right vocal cord scarring. She stated that she will fax over the notes.  There is a referral in already but since she had medicaid it has to come from her PCP.  Please advise.

## 2016-05-18 ENCOUNTER — Other Ambulatory Visit: Payer: Self-pay

## 2016-05-18 MED ORDER — BECLOMETHASONE DIPROPIONATE 40 MCG/ACT IN AERS: 1.0000 | INHALATION_SPRAY | Freq: Two times a day (BID) | RESPIRATORY_TRACT | 12 refills | Status: DC

## 2016-05-21 ENCOUNTER — Encounter: Payer: Self-pay | Admitting: Family Medicine

## 2016-05-31 ENCOUNTER — Other Ambulatory Visit: Payer: Self-pay | Admitting: Otolaryngology

## 2016-05-31 DIAGNOSIS — R1319 Other dysphagia: Secondary | ICD-10-CM

## 2016-06-01 DIAGNOSIS — M1711 Unilateral primary osteoarthritis, right knee: Secondary | ICD-10-CM | POA: Insufficient documentation

## 2016-06-01 DIAGNOSIS — M159 Polyosteoarthritis, unspecified: Secondary | ICD-10-CM | POA: Insufficient documentation

## 2016-06-01 DIAGNOSIS — R768 Other specified abnormal immunological findings in serum: Secondary | ICD-10-CM | POA: Insufficient documentation

## 2016-06-01 DIAGNOSIS — R682 Dry mouth, unspecified: Secondary | ICD-10-CM | POA: Insufficient documentation

## 2016-06-07 ENCOUNTER — Ambulatory Visit: Payer: Medicaid Other | Admitting: Physical Therapy

## 2016-06-24 ENCOUNTER — Ambulatory Visit
Admission: RE | Admit: 2016-06-24 | Discharge: 2016-06-24 | Disposition: A | Discharge status: Home / Self Care | Payer: Medicaid Other | Source: Ambulatory Visit | Attending: Otolaryngology | Admitting: Otolaryngology

## 2016-06-24 DIAGNOSIS — R911 Solitary pulmonary nodule: Secondary | ICD-10-CM | POA: Insufficient documentation

## 2016-06-24 DIAGNOSIS — D1809 Hemangioma of other sites: Secondary | ICD-10-CM | POA: Insufficient documentation

## 2016-06-24 DIAGNOSIS — R1310 Dysphagia, unspecified: Secondary | ICD-10-CM | POA: Diagnosis present

## 2016-06-24 DIAGNOSIS — F319 Bipolar disorder, unspecified: Secondary | ICD-10-CM | POA: Insufficient documentation

## 2016-06-24 DIAGNOSIS — K219 Gastro-esophageal reflux disease without esophagitis: Secondary | ICD-10-CM | POA: Diagnosis not present

## 2016-06-24 DIAGNOSIS — R1319 Other dysphagia: Secondary | ICD-10-CM

## 2016-06-24 DIAGNOSIS — M069 Rheumatoid arthritis, unspecified: Secondary | ICD-10-CM | POA: Insufficient documentation

## 2016-06-24 NOTE — Therapy (Signed)
[place], [place], [address] Phone: [phone]   Fax:     Modified Barium Swallow  Patient Details  Name: Kathryn Ramirez MRN: [id number] Date of Birth: Nov 01, 1950 No Data Recorded  Encounter Date: 2016-07-02      End of Session - Jul 02, 2016 1327    Visit Number 1   Number of Visits 1   Date for SLP Re-Evaluation 2016-07-02   SLP Start Time 28   SLP Stop Time  1400   SLP Time Calculation (min) 60 min   Activity Tolerance Patient tolerated treatment well      Past Medical History:  Diagnosis Date  . Acid reflux disease   . Bipolar 1 disorder ([place])   . Liver hemangioma   . Lung nodule   . Psoriasis   . Rheumatoid arthritis ([place])   . Tardive dyskinesia     Past Surgical History:  Procedure Laterality Date  . FLEXIBLE BRONCHOSCOPY N/A 01/13/2016   Procedure: FLEXIBLE BRONCHOSCOPY;  Surgeon: Vilinda Boehringer, MD;  Location: [facility] ORS;  Service: Cardiopulmonary;  Laterality: N/A;  . NEUROPLASTY MAJOR NERVE    . POLYPECTOMY    . THROAT SURGERY      There were no vitals filed for this visit.   Subjective: Patient behavior: (alertness, ability to follow instructions, etc.): Chief complaint: dysphagia   Objective:  Radiological Procedure: A videoflouroscopic evaluation of oral-preparatory, reflex initiation, and pharyngeal phases of the swallow was performed; as well as a screening of the upper esophageal phase.  I. POSTURE: upright II. VIEW: lateral III. COMPENSATORY STRATEGIES: none indicated IV. BOLUSES ADMINISTERED:  Thin Liquid: 4 trials  Nectar-thick Liquid: 1 trial  Honey-thick Liquid: NT  Puree: 3 trials  Mechanical Soft: 1 trial V. RESULTS OF EVALUATION: A. ORAL PREPARATORY PHASE: (The lips, tongue, and velum are observed for strength and coordination)       **Overall Severity Rating: WFL  B. SWALLOW INITIATION/REFLEX: (The reflex is normal if "triggered" by the time  the bolus reached the base of the tongue)  **Overall Severity Rating: WFL  C. PHARYNGEAL PHASE: (Pharyngeal function is normal if the bolus shows rapid, smooth, and continuous transit through the pharynx and there is no pharyngeal residue after the swallow)  **Overall Severity Rating: WFL  D. LARYNGEAL PENETRATION: (Material entering into the laryngeal inlet/vestibule but not aspirated): none  E. ASPIRATION: none F. ESOPHAGEAL PHASE: (Screening of the upper esophagus)  ASSESSMENT: Pt appeared to present w/ an adequate oropharyngeal phase swallow function w/ appropriate timing of the pharyngeal swallow w/ no laryngeal penetration or aspiration noted to occur during this study. No pharyngeal residue remained post swallow of trials. Oral phase appeared wfl for bolus management and A-P transfer in a timely manner; oral clearing post swallow was appropriate.   PLAN/RECOMMENDATIONS:  A. Diet: regular(meats cut small and moistened); Thin liquids.   B. Swallowing Precautions: general aspiration precautions; REFLUX precautions  C. Recommended consultation to: n/a at this time  D. Therapy recommendations: no further skilled ST services indicated for Dysphagia. Recommend f/u w/ ST services(Voice) for Dysphonia on an Outpatient basis as pt desires. Contact information given.   E. Results and recommendations were: discussed w/ pt; video viewed w/ pt and recommendations discussed w/ pt            Other dysphagia - Plan: DG SWALLOWING FUNC-SPEECH PATHOLOGY, DG SWALLOWING FUNC-SPEECH PATHOLOGY      G-Codes - 2016-07-02 1328    Functional Assessment Tool Used clinical  judgement   Functional Limitations Swallowing   Swallow Current Status [phone]) At least 1 percent but less than 20 percent impaired, limited or restricted   Swallow Goal Status [id number]) At least 1 percent but less than 20 percent impaired, limited or restricted   Swallow Discharge Status [phone]) At least 1 percent but less than 20  percent impaired, limited or restricted          Problem List Patient Active Problem List   Diagnosis Date Noted  . 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 Chest pain 04/06/2016  . Impairment of balance 02/05/2016  . Acute right-sided low back pain without sciatica 02/05/2016  . Normal blood pressure 01/23/2016  . Pulmonary infiltrates   . Chronic cough   . Pulmonary scarring 01/06/2016  . OAB (overactive bladder) 12/18/2015  . Asthma 09/18/2015  . Multiple pulmonary nodules 09/15/2015  . Breast asymmetry in female 09/15/2015  . Community acquired pneumonia 08/14/2015  . Abnormal urinalysis 08/14/2015  . Knee pain, right 07/02/2015  . Baker's cyst of knee 07/02/2015  . Bipolar disorder with moderate depression ([place]) 12/19/2014  . Chronic pain 12/19/2014      Orinda Kenner, MS, CCC-SLP  Kathryn Ramirez 06/24/2016, 1:30 PM  [facility] [place], [place], [address] Phone: [phone]   Fax:     Name: Kathryn Ramirez MRN: [id number] Date of Birth: Aug 24, 1950

## 2016-07-21 ENCOUNTER — Other Ambulatory Visit: Payer: Self-pay | Admitting: Family Medicine

## 2016-08-02 ENCOUNTER — Other Ambulatory Visit: Payer: Self-pay | Admitting: Family Medicine

## 2016-08-02 DIAGNOSIS — N3281 Overactive bladder: Secondary | ICD-10-CM

## 2016-08-09 ENCOUNTER — Encounter: Payer: Self-pay | Admitting: Family Medicine

## 2016-08-09 ENCOUNTER — Ambulatory Visit (INDEPENDENT_AMBULATORY_CARE_PROVIDER_SITE_OTHER): Payer: Medicaid Other | Admitting: Family Medicine

## 2016-08-09 DIAGNOSIS — K219 Gastro-esophageal reflux disease without esophagitis: Secondary | ICD-10-CM | POA: Diagnosis not present

## 2016-08-09 MED ORDER — ESOMEPRAZOLE MAGNESIUM 40 MG PO CPDR: 40.0000 mg | DELAYED_RELEASE_CAPSULE | ORAL | 2 refills | Status: DC

## 2016-08-09 NOTE — Progress Notes (Signed)
Name: Kathryn Ramirez   MRN: [id number]    DOB: 1950/09/14   Date:08/09/2016       Progress Note  Subjective  Chief Complaint  Chief Complaint  Patient presents with  . Referral    Gastro    HPI  Pt. Is here requesting a referral to Gastroenterology because of worsening acid reflux, she vomits acid-like fluid, burps and has gas a lot of time. She gets nauseated once in a while as well. No diarrhea or constipation. She has been taking OTC Omeprazole and not Pantoprazole which is listed in her medication list.    Past Medical History:  Diagnosis Date  . Acid reflux disease   . Bipolar 1 disorder ([place])   . Liver hemangioma   . Lung nodule   . Psoriasis   . Rheumatoid arthritis ([place])   . Tardive dyskinesia     Past Surgical History:  Procedure Laterality Date  . FLEXIBLE BRONCHOSCOPY N/A 01/13/2016   Procedure: FLEXIBLE BRONCHOSCOPY;  Surgeon: Vilinda Boehringer, MD;  Location: [facility] ORS;  Service: Cardiopulmonary;  Laterality: N/A;  . NEUROPLASTY MAJOR NERVE    . POLYPECTOMY    . THROAT SURGERY      Family History  Problem Relation Age of Onset  . Cancer Mother     Esophageal Ca  . Asthma Brother   . Breast cancer Neg Hx     Social History   Social History  . Marital status: Single    Spouse name: N/A  . Number of children: N/A  . Years of education: N/A   Occupational History  . Not on file.   Social History Main Topics  . Smoking status: Former Smoker    Types: E-cigarettes    Quit date: 09/19/2015  . Smokeless tobacco: Never Used  . Alcohol use No     Comment: 03/31/2012 sobierty   . Drug use: No  . Sexual activity: No   Other Topics Concern  . Not on file   Social History Narrative  . No narrative on file     Current Outpatient Prescriptions:  .  albuterol (PROVENTIL HFA;VENTOLIN HFA) 108 (90 Base) MCG/ACT inhaler, Inhale 1-2 puffs into the lungs every 6 (six) hours as needed for wheezing or shortness of breath., Disp: 1 Inhaler, Rfl: 2 .   ARIPiprazole (ABILIFY) 10 MG tablet, Take 10 mg by mouth daily., Disp: , Rfl:  .  beclomethasone (QVAR) 40 MCG/ACT inhaler, Inhale 1 puff into the lungs 2 (two) times daily., Disp: 1 Inhaler, Rfl: 12 .  buPROPion (WELLBUTRIN XL) 150 MG 24 hr tablet, Take 150 mg by mouth daily., Disp: , Rfl:  .  gabapentin (NEURONTIN) 300 MG capsule, Take 300 mg by mouth 2 (two) times daily., Disp: , Rfl:  .  pantoprazole (PROTONIX) 40 MG tablet, Take 40 mg by mouth daily. Reported on 01/23/2016, Disp: , Rfl:  .  traZODone (DESYREL) 50 MG tablet, Take 50-150 mg by mouth at bedtime. , Disp: , Rfl:  .  VESICARE 10 MG tablet, TAKE 1 TABLET BY MOUTH DAILY, Disp: 30 tablet, Rfl: 2 .  traMADol (ULTRAM) 50 MG tablet, Take 50 mg by mouth 2 (two) times daily. , Disp: , Rfl:   Allergies  Allergen Reactions  . Codeine Nausea Only  . Haldol [Haloperidol] Other (See Comments)    Reaction: Pt "felt like her neck was going to snap."  . Haloperidol Lactate Other (See Comments)    Sever neck stiffness.  . Trileptal [Oxcarbazepine] Swelling  Review of Systems  Constitutional: Positive for malaise/fatigue. Negative for chills, fever and weight loss.  Respiratory: Positive for cough. Negative for shortness of breath.   Gastrointestinal: Positive for heartburn. Negative for abdominal pain, constipation, diarrhea, nausea and vomiting.  Genitourinary: Negative for dysuria.    Objective  Vitals:   08/09/16 1021  BP: 125/74  Pulse: (!) 105  Resp: 17  Temp: 98 F (36.7 C)  TempSrc: Oral  SpO2: 95%  Weight: 208 lb 4.8 oz (94.5 kg)  Height: 5\' 4"  (1.626 m)    Physical Exam  Constitutional: She is oriented to person, place, and time and well-developed, well-nourished, and in no distress.  HENT:  Head: Normocephalic and atraumatic.  Cardiovascular: Normal rate and regular rhythm.   No murmur heard. Pulmonary/Chest: Effort normal and breath sounds normal. She has no wheezes.  Abdominal: Soft. Bowel sounds are  normal. There is tenderness in the epigastric area.  Neurological: She is alert and oriented to person, place, and time.  Psychiatric: Mood, memory, affect and judgment normal.  Nursing note and vitals reviewed.     Recent Results (from the past 2160 hour(s))  NM Myocar Multi W/Spect W/Wall Motion / EF     Status: None   Collection Time: 05/12/16 11:33 AM  Result Value Ref Range   Rest HR 75 bpm   Rest BP 143/92 mmHg   Percent HR 57 %   Peak HR 90 bpm   Peak BP 147/82 mmHg   SSS 5    SRS 2    SDS 3    TID 1.29    LV sys vol 18 mL   LV dias vol 59 46 - 106 mL     Assessment & Plan  1. Gastroesophageal reflux disease, esophagitis presence not specified Start on Nexium 40 mg daily, referral to GI - Ambulatory referral to Gastroenterology - esomeprazole (NEXIUM) 40 MG capsule; Take 1 capsule (40 mg total) by mouth every morning.  Dispense: 30 capsule; Refill: 2   Million Maharaj [facility] 08/09/2016 10:47 AM

## 2016-08-11 ENCOUNTER — Other Ambulatory Visit: Payer: Self-pay | Admitting: Family Medicine

## 2016-08-11 DIAGNOSIS — K219 Gastro-esophageal reflux disease without esophagitis: Secondary | ICD-10-CM

## 2016-08-11 MED ORDER — ESOMEPRAZOLE MAGNESIUM 40 MG PO CPDR: 40.0000 mg | DELAYED_RELEASE_CAPSULE | Freq: Every day | ORAL | 0 refills | Status: DC

## 2016-08-12 ENCOUNTER — Ambulatory Visit: Payer: Medicaid Other | Admitting: Physical Therapy

## 2016-08-15 ENCOUNTER — Ambulatory Visit
Admission: EM | Admit: 2016-08-15 | Discharge: 2016-08-15 | Disposition: A | Discharge status: Home / Self Care | Payer: Medicaid Other | Attending: Family Medicine | Admitting: Family Medicine

## 2016-08-15 ENCOUNTER — Ambulatory Visit: Payer: Medicaid Other

## 2016-08-15 ENCOUNTER — Encounter: Payer: Self-pay | Admitting: Emergency Medicine

## 2016-08-15 DIAGNOSIS — J984 Other disorders of lung: Secondary | ICD-10-CM | POA: Insufficient documentation

## 2016-08-15 DIAGNOSIS — D1803 Hemangioma of intra-abdominal structures: Secondary | ICD-10-CM | POA: Insufficient documentation

## 2016-08-15 DIAGNOSIS — L409 Psoriasis, unspecified: Secondary | ICD-10-CM | POA: Diagnosis not present

## 2016-08-15 DIAGNOSIS — N3281 Overactive bladder: Secondary | ICD-10-CM | POA: Insufficient documentation

## 2016-08-15 DIAGNOSIS — R918 Other nonspecific abnormal finding of lung field: Secondary | ICD-10-CM | POA: Diagnosis not present

## 2016-08-15 DIAGNOSIS — G8929 Other chronic pain: Secondary | ICD-10-CM | POA: Diagnosis not present

## 2016-08-15 DIAGNOSIS — Z87891 Personal history of nicotine dependence: Secondary | ICD-10-CM | POA: Diagnosis not present

## 2016-08-15 DIAGNOSIS — I700 Atherosclerosis of aorta: Secondary | ICD-10-CM | POA: Diagnosis not present

## 2016-08-15 DIAGNOSIS — M545 Low back pain: Secondary | ICD-10-CM | POA: Diagnosis not present

## 2016-08-15 DIAGNOSIS — R911 Solitary pulmonary nodule: Secondary | ICD-10-CM | POA: Insufficient documentation

## 2016-08-15 DIAGNOSIS — F319 Bipolar disorder, unspecified: Secondary | ICD-10-CM | POA: Diagnosis not present

## 2016-08-15 DIAGNOSIS — R05 Cough: Secondary | ICD-10-CM | POA: Diagnosis present

## 2016-08-15 DIAGNOSIS — J381 Polyp of vocal cord and larynx: Secondary | ICD-10-CM | POA: Diagnosis not present

## 2016-08-15 DIAGNOSIS — R509 Fever, unspecified: Secondary | ICD-10-CM | POA: Diagnosis present

## 2016-08-15 DIAGNOSIS — J45901 Unspecified asthma with (acute) exacerbation: Secondary | ICD-10-CM | POA: Diagnosis not present

## 2016-08-15 DIAGNOSIS — K219 Gastro-esophageal reflux disease without esophagitis: Secondary | ICD-10-CM | POA: Insufficient documentation

## 2016-08-15 DIAGNOSIS — M069 Rheumatoid arthritis, unspecified: Secondary | ICD-10-CM | POA: Insufficient documentation

## 2016-08-15 DIAGNOSIS — R0602 Shortness of breath: Secondary | ICD-10-CM | POA: Diagnosis present

## 2016-08-15 MED ORDER — PREDNISONE 50 MG PO TABS: ORAL_TABLET | ORAL | 0 refills | Status: DC

## 2016-08-15 NOTE — Discharge Instructions (Signed)
Continue asthma medications.  Prednisone as prescribed.  Please follow-up your primary care physician this week.  Take care  Dr. Lacinda Axon

## 2016-08-15 NOTE — ED Triage Notes (Signed)
Shortness of breath, pain in back when coughing, no appetite, non-productive cough for 3 days

## 2016-08-15 NOTE — ED Provider Notes (Signed)
[facility]    CSN: [id number] Arrival date & time: 08/15/16  1147  History   Chief Complaint Chief Complaint  Patient presents with  . Cough  . Fever   HPI  66 year old female with a history of asthma followed by pulmonology presents with complaints of shortness of breath.  Patient reports that she's had increasing shortness of breath over the past 3 days. She endorses compliance with her Qvar. She states that she's been having to use her rescue inhaler more often and has had no improvement with use. She reports worsening cough as well. Cough is mildly productive. Of note, patient has chronic cough as documented in R medical record. No associated documented fever. No reports of chills. No known exacerbating factors. No other associated symptoms. No other complaints or concerns this time.  Past Medical History:  Diagnosis Date  . Acid reflux disease   . Bipolar 1 disorder ([place])   . Liver hemangioma   . Lung nodule   . Psoriasis   . Rheumatoid arthritis ([place])   . Tardive dyskinesia    Patient Active Problem List   Diagnosis Date Noted  . GERD (gastroesophageal reflux disease) 08/09/2016  . 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 Chest pain 04/06/2016  . Impairment of balance 02/05/2016  . Acute right-sided low back pain without sciatica 02/05/2016  . Normal blood pressure 01/23/2016  . Pulmonary infiltrates   . Chronic cough   . Pulmonary scarring 01/06/2016  . OAB (overactive bladder) 12/18/2015  . Asthma 09/18/2015  . Multiple pulmonary nodules 09/15/2015  . Bipolar disorder with moderate depression ([place]) 12/19/2014  . Chronic pain 12/19/2014    Past Surgical History:  Procedure Laterality Date  . FLEXIBLE BRONCHOSCOPY N/A 01/13/2016   Procedure: FLEXIBLE BRONCHOSCOPY;  Surgeon: Vilinda Boehringer, MD;  Location: [facility] ORS;  Service: Cardiopulmonary;  Laterality: N/A;  . NEUROPLASTY MAJOR NERVE    . POLYPECTOMY    . THROAT SURGERY     OB History    No data  available     Home Medications    Prior to Admission medications   Medication Sig Start Date End Date Taking? Authorizing Provider  albuterol (PROVENTIL HFA;VENTOLIN HFA) 108 (90 Base) MCG/ACT inhaler Inhale 1-2 puffs into the lungs every 6 (six) hours as needed for wheezing or shortness of breath. 02/18/16  Yes Roselee Nova, MD  ARIPiprazole (ABILIFY) 10 MG tablet Take 10 mg by mouth daily.   Yes Historical Provider, MD  beclomethasone (QVAR) 40 MCG/ACT inhaler Inhale 1 puff into the lungs 2 (two) times daily. 05/18/16  Yes Vishal Mungal, MD  buPROPion (WELLBUTRIN XL) 150 MG 24 hr tablet Take 150 mg by mouth daily.   Yes Historical Provider, MD  esomeprazole (NEXIUM) 40 MG capsule Take 1 capsule (40 mg total) by mouth daily. 08/11/16  Yes Roselee Nova, MD  gabapentin (NEURONTIN) 300 MG capsule Take 300 mg by mouth 2 (two) times daily.   Yes Historical Provider, MD  traZODone (DESYREL) 50 MG tablet Take 50-150 mg by mouth at bedtime.    Yes Historical Provider, MD  VESICARE 10 MG tablet TAKE 1 TABLET BY MOUTH DAILY 08/02/16  Yes Roselee Nova, MD  predniSONE (DELTASONE) 50 MG tablet 1 tablet daily x 5 days. 08/15/16   Coral Spikes, DO  traMADol (ULTRAM) 50 MG tablet Take 50 mg by mouth 2 (two) times daily.  04/28/15   Historical Provider, MD   Family History Family History  Problem Relation  Age of Onset  . Cancer Mother     Esophageal Ca  . Asthma Brother   . Breast cancer Neg Hx     Social History Social History  Substance Use Topics  . Smoking status: Former Smoker    Types: E-cigarettes    Quit date: 09/19/2015  . Smokeless tobacco: Never Used  . Alcohol use No     Comment: 03/31/2012 sobierty    Allergies   Codeine; Haldol [haloperidol]; Haloperidol lactate; and Trileptal [oxcarbazepine]  Review of Systems Review of Systems  Constitutional: Negative for fever.  Respiratory: Positive for cough and shortness of breath.    Physical Exam Triage Vital Signs ED Triage  Vitals  Enc Vitals Group     BP 08/15/16 1339 131/71     Pulse Rate 08/15/16 1339 79     Resp 08/15/16 1339 18     Temp 08/15/16 1339 97.1 F (36.2 C)     Temp Source 08/15/16 1339 Tympanic     SpO2 08/15/16 1339 98 %     Weight 08/15/16 1341 205 lb (93 kg)     Height 08/15/16 1341 5\' 4"  (1.626 m)     Head Circumference --      Peak Flow --      Pain Score 08/15/16 1343 4     Pain Loc --      Pain Edu? --      Excl. in [place]? --    Updated Vital Signs BP 131/71 (BP Location: Left Arm)   Pulse 79   Temp 97.1 F (36.2 C) (Tympanic)   Resp 18   Ht 5\' 4"  (1.626 m)   Wt 205 lb (93 kg)   SpO2 98%   BMI 35.19 kg/m    Physical Exam  Constitutional: She is oriented to person, place, and time. She appears well-developed. No distress.  HENT:  Mouth/Throat: Oropharynx is clear and moist.  Neck: Neck supple.  Cardiovascular: Normal rate and regular rhythm.   Pulmonary/Chest: Effort normal and breath sounds normal. She has no wheezes. She has no rales.  Neurological: She is alert and oriented to person, place, and time.  Vitals reviewed.  UC Treatments / Results  Labs (all labs ordered are listed, but only abnormal results are displayed) Labs Reviewed - No data to display  EKG  EKG Interpretation None       Radiology Dg Chest 2 View  Result Date: 08/15/2016 CLINICAL DATA:  Cough and shortness of breath, 3 days duration. History of lung nodules. EXAM: CHEST  2 VIEW COMPARISON:  CT 12/10/2015.  Radiography 09/01/2015. FINDINGS: Heart size is normal. There is aortic atherosclerosis. Mild pleural and parenchymal scarring in the lower lungs. No sign of active infiltrate, mass, effusion or collapse. Ordinary degenerative changes affect the spine. Previous cervical ACDF. IMPRESSION: No active disease. Pleural and parenchymal scarring in the lower chest. Electronically Signed   By: Nelson Chimes M.D.   On: 08/15/2016 15:20    Procedures Procedures (including critical care  time)  Medications Ordered in UC Medications - No data to display   Initial Impression / Assessment and Plan / UC Course  I have reviewed the triage vital signs and the nursing notes.  Pertinent labs & imaging results that were available during my care of the patient were reviewed by me and considered in my medical decision making (see chart for details).  66 year old female with a history of asthma presents with worsening shortness of breath. Exam unremarkable. Given history, comorbid disease, and  prior pneumonia in 2017, obtaining chest x-ray.  330 PM Chest x-ray returned negative. Patient no respiratory distress. Treating for an acute asthma exacerbation. Starting on prednisone. Continue Qvar BID and albuterol as needed.  Final Clinical Impressions(s) / UC Diagnoses   Final diagnoses:  Exacerbation of persistent asthma, unspecified asthma severity   New Prescriptions Discharge Medication List as of 08/15/2016  3:27 PM    START taking these medications   Details  predniSONE (DELTASONE) 50 MG tablet 1 tablet daily x 5 days., Print         Coral Spikes, DO 08/15/16 1533

## 2016-08-19 ENCOUNTER — Ambulatory Visit (INDEPENDENT_AMBULATORY_CARE_PROVIDER_SITE_OTHER): Payer: Medicaid Other | Admitting: Family Medicine

## 2016-08-19 ENCOUNTER — Encounter: Payer: Self-pay | Admitting: Family Medicine

## 2016-08-19 DIAGNOSIS — R221 Localized swelling, mass and lump, neck: Secondary | ICD-10-CM | POA: Diagnosis not present

## 2016-08-19 DIAGNOSIS — J4541 Moderate persistent asthma with (acute) exacerbation: Secondary | ICD-10-CM | POA: Diagnosis not present

## 2016-08-19 DIAGNOSIS — J45901 Unspecified asthma with (acute) exacerbation: Secondary | ICD-10-CM | POA: Insufficient documentation

## 2016-08-19 DIAGNOSIS — Z8639 Personal history of other endocrine, nutritional and metabolic disease: Secondary | ICD-10-CM

## 2016-08-19 LAB — TSH: TSH: 2.13 m[IU]/L

## 2016-08-19 NOTE — Progress Notes (Signed)
Name: Kathryn Ramirez   MRN: [id number]    DOB: April 10, 1951   Date:08/19/2016       Progress Note  Subjective  Chief Complaint  Chief Complaint  Patient presents with  . Follow-up    Urgent care follow up for lungs    HPI  Pt. Presents for Urgent Care follow up. She was seen for worsening shortness of breath and coughing, was diagnosed with acute Asthma Exacerbation and provided 5 day course of prednisone and asked to continue on Qvar BID. She reprots that she only took 3 days of Prednsione because it made her nervous. However, she feels significantly better now in terms of breathing, using Albuterol and Q-var as prescribed.  She also wishes to have her Thyroid levels checked, reportedly has hx of Hypothyroidism and was on Levothyroxine being followed by Endocrinology until 4 years ago. She stopped taking Levothyroxine 4 years ago. SHe feesl swelling in her neck around the thyroid, has fatigue, has constipation, no cold intolerance or dry skin.   Past Medical History:  Diagnosis Date  . Acid reflux disease   . Bipolar 1 disorder ([place])   . Liver hemangioma   . Lung nodule   . Psoriasis   . Rheumatoid arthritis ([place])   . Tardive dyskinesia     Past Surgical History:  Procedure Laterality Date  . FLEXIBLE BRONCHOSCOPY N/A 01/13/2016   Procedure: FLEXIBLE BRONCHOSCOPY;  Surgeon: Vilinda Boehringer, MD;  Location: [facility] ORS;  Service: Cardiopulmonary;  Laterality: N/A;  . NEUROPLASTY MAJOR NERVE    . POLYPECTOMY    . THROAT SURGERY      Family History  Problem Relation Age of Onset  . Cancer Mother     Esophageal Ca  . Asthma Brother   . Breast cancer Neg Hx     Social History   Social History  . Marital status: Single    Spouse name: N/A  . Number of children: N/A  . Years of education: N/A   Occupational History  . Not on file.   Social History Main Topics  . Smoking status: Former Smoker    Types: E-cigarettes    Quit date: 09/19/2015  . Smokeless tobacco: Never  Used  . Alcohol use No     Comment: 03/31/2012 sobierty   . Drug use: No  . Sexual activity: No   Other Topics Concern  . Not on file   Social History Narrative  . No narrative on file     Current Outpatient Prescriptions:  .  albuterol (PROVENTIL HFA;VENTOLIN HFA) 108 (90 Base) MCG/ACT inhaler, Inhale 1-2 puffs into the lungs every 6 (six) hours as needed for wheezing or shortness of breath., Disp: 1 Inhaler, Rfl: 2 .  ARIPiprazole (ABILIFY) 10 MG tablet, Take 10 mg by mouth daily., Disp: , Rfl:  .  beclomethasone (QVAR) 40 MCG/ACT inhaler, Inhale 1 puff into the lungs 2 (two) times daily., Disp: 1 Inhaler, Rfl: 12 .  buPROPion (WELLBUTRIN XL) 150 MG 24 hr tablet, Take 150 mg by mouth daily., Disp: , Rfl:  .  esomeprazole (NEXIUM) 40 MG capsule, Take 1 capsule (40 mg total) by mouth daily., Disp: 30 capsule, Rfl: 0 .  gabapentin (NEURONTIN) 300 MG capsule, Take 300 mg by mouth 2 (two) times daily., Disp: , Rfl:  .  traMADol (ULTRAM) 50 MG tablet, Take 50 mg by mouth 2 (two) times daily. , Disp: , Rfl:  .  traZODone (DESYREL) 50 MG tablet, Take 50-150 mg by mouth at bedtime. ,  Disp: , Rfl:  .  VESICARE 10 MG tablet, TAKE 1 TABLET BY MOUTH DAILY, Disp: 30 tablet, Rfl: 2 .  predniSONE (DELTASONE) 50 MG tablet, 1 tablet daily x 5 days. (Patient not taking: Reported on 08/19/2016), Disp: 5 tablet, Rfl: 0  Allergies  Allergen Reactions  . Codeine Nausea Only  . Haldol [Haloperidol] Other (See Comments)    Reaction: Pt "felt like her neck was going to snap."  . Haloperidol Lactate Other (See Comments)    Sever neck stiffness.  . Trileptal [Oxcarbazepine] Swelling     ROS  Please see history of present illness for complete list of ROS  Objective  Vitals:   08/19/16 1131  BP: 130/70  Pulse: 86  Resp: 17  Temp: 97.8 F (36.6 C)  TempSrc: Oral  SpO2: 96%  Weight: 207 lb (93.9 kg)  Height: 5\' 4"  (1.626 m)    Physical Exam  Constitutional: She is oriented to person, place,  and time and well-developed, well-nourished, and in no distress.  HENT:  Head: Normocephalic and atraumatic.  Neck: Normal range of motion. Neck supple. No thyromegaly present.  Cardiovascular: Normal rate, regular rhythm and normal heart sounds.   No murmur heard. Pulmonary/Chest: Effort normal and breath sounds normal. She has no wheezes.  Neurological: She is alert and oriented to person, place, and time.  Psychiatric: Mood, memory, affect and judgment normal.  Nursing note and vitals reviewed.     Assessment & Plan  1. History of hypothyroidism Symptoms suggestive of hypothyroidism, although could be explained by other medical conditions, obtain TSH and follow-up. - TSH  2. Neck swelling On my exam today, there is no neck swelling, or other palpable abnormality. If TSH is normal, suggest obtaining ultrasound of the neck for further evaluation, patient verbalized understanding and agreement with the plan  3. Moderate persistent asthma with acute exacerbation Notes from urgent care reviewed and discussed with patient in detail. She has been taking Qvar as prescribed, symptoms of asthma exacerbation have resolved. Reassured   Lorree Millar [facility] 08/19/2016 11:43 AM

## 2016-08-24 NOTE — Progress Notes (Signed)
* [facility]     Assessment and Plan:  Asthma. --Poorly controlled with qvar, will start anoro inhaler.   Cough. --Appears adequately controlled at this time.   Vocal cord polyps.  GERD. -With significant symptoms including nausea, has been referred to gastroenterology and is planned for endoscopy and colonoscopy later this month.    Date: 08/24/2016  MRN# [id number] Kathryn Ramirez [birth year]   Kathryn Ramirez is a 66 y.o. old female seen in follow up for chief complaint of  Chief Complaint  Patient presents with  . Follow-up    SOB w/exertion; gets pain in upper back when coughing, prod at times:      HPI:   Patient is a pleasant 66 year old female past medical history of bipolar, tardive dyskinesia, rheumatoid arthritis, hypertension, multiple pulmonary nodules, asthma, acid reflux disease, obesity, seen in consultation for pulmonary nodules. She is a former smoker quit in August 2016, previously smoked one pack per day for 30 years. She previously worked as a Psychologist, sport and exercise, has since retired. She last saw Dr. Stevenson Clinch in October of last year for asthma, cough. At that time it was noted that she had a history of vocal cord polyps, previously followed by [facility]. She was therefore referred back to ENT for further management.   She was started on Qvar for her asthma.She notes difficulty breathing with exertion, she uses her rescue inhaler about 4 times per day. She has not followed up with ENT in some time. She continues to have hoarseness, which is stable.  She takes qvar 1 puff bid, and rinses mouth after use.    PFT on 12/11/15; Slightly reduced FEV1, normal FVC, ratio, RV, slightly increased RV/TLC, c/w mild air trapping. Normal DLCO, normal flow volume loops.    Hx of Right Vocal cord polyp removal at [facility] [place], [place], 2013. Now with Right VC scarring. Followed previously by [facility]. Had a bronchoscopy 01/13/16 due to chronic  cough - negative BAL  Medication:   Outpatient Encounter Prescriptions as of 09/07/2016  Medication Sig  . albuterol (PROVENTIL HFA;VENTOLIN HFA) 108 (90 Base) MCG/ACT inhaler Inhale 1-2 puffs into the lungs every 6 (six) hours as needed for wheezing or shortness of breath.  . ARIPiprazole (ABILIFY) 10 MG tablet Take 10 mg by mouth daily.  . beclomethasone (QVAR) 40 MCG/ACT inhaler Inhale 1 puff into the lungs 2 (two) times daily.  [name] buPROPion (WELLBUTRIN XL) 150 MG 24 hr tablet Take 150 mg by mouth daily.  [name] esomeprazole (NEXIUM) 40 MG capsule Take 1 capsule (40 mg total) by mouth daily.  [name] gabapentin (NEURONTIN) 300 MG capsule Take 300 mg by mouth 2 (two) times daily.  . predniSONE (DELTASONE) 50 MG tablet 1 tablet daily x 5 days. (Patient not taking: Reported on 08/19/2016)  . traMADol (ULTRAM) 50 MG tablet Take 50 mg by mouth 2 (two) times daily.   . traZODone (DESYREL) 50 MG tablet Take 50-150 mg by mouth at bedtime.   . VESICARE 10 MG tablet TAKE 1 TABLET BY MOUTH DAILY   No facility-administered encounter medications on file as of 09/07/2016.      Allergies:  Codeine; Haldol [haloperidol]; Haloperidol lactate; and Trileptal [oxcarbazepine]  Review of Systems: Gen:  Denies  fever, sweats. HEENT: Denies blurred vision. Cvc:  No dizziness, chest pain or heaviness Resp:   Denies cough or sputum porduction. Gi: Denies swallowing difficulty, stomach pain. constipation, bowel incontinence Gu:  Denies bladder incontinence, burning urine Ext:  No Joint pain, stiffness. Skin: No skin rash, easy bruising. Endoc:  No polyuria, polydipsia. Psych: No depression, insomnia. Other:  All other systems were reviewed and found to be negative other than what is mentioned in the HPI.   Physical Examination:   VS: BP 126/74 (BP Location: Left Arm, Cuff Size: Normal)   Pulse 77   Wt 208 lb (94.3 kg)   SpO2 97%   BMI 35.70 kg/m   General Appearance: No distress  Neuro:without focal findings,   speech normal,  HEENT: PERRLA, EOM intact. Pulmonary: normal breath sounds, No wheezing.   CardiovascularNormal S1,S2.  No m/r/g.   Abdomen: Benign, Soft, non-tender. Renal:  No costovertebral tenderness  GU:  Not performed at this time. Endoc: No evident thyromegaly, no signs of acromegaly. Skin:   warm, no rash. Extremities: normal, no cyanosis, clubbing.   LABORATORY PANEL:   CBC No results for input(s): WBC, HGB, HCT, PLT in the last 168 hours. ------------------------------------------------------------------------------------------------------------------  Chemistries  No results for input(s): NA, K, CL, CO2, GLUCOSE, BUN, CREATININE, CALCIUM, MG, AST, ALT, ALKPHOS, BILITOT in the last 168 hours.  Invalid input(s): GFRCGP ------------------------------------------------------------------------------------------------------------------  Cardiac Enzymes No results for input(s): TROPONINI in the last 168 hours. ------------------------------------------------------------  RADIOLOGY:   No results found for this or any previous visit. Results for orders placed during the hospital encounter of 08/15/16  DG Chest 2 View   Narrative CLINICAL DATA:  Cough and shortness of breath, 3 days duration. History of lung nodules.  EXAM: CHEST  2 VIEW  COMPARISON:  CT 12/10/2015.  Radiography 09/01/2015.  FINDINGS: Heart size is normal. There is aortic atherosclerosis. Mild pleural and parenchymal scarring in the lower lungs. No sign of active infiltrate, mass, effusion or collapse. Ordinary degenerative changes affect the spine. Previous cervical ACDF.  IMPRESSION: No active disease. Pleural and parenchymal scarring in the lower chest.   Electronically Signed   By: Nelson Chimes M.D.   On: 08/15/2016 15:20    ------------------------------------------------------------------------------------------------------------------  Thank  you for allowing [facility] to assist in the care of your patient. Our recommendations are noted above.  Please contact us if we can be of further service.   Marda Stalker, MD.  [facility] Office Number: [phone]  Patricia Pesa, M.D.  Vilinda Boehringer, M.D.  Merton Border, M.D  08/24/2016

## 2016-08-25 ENCOUNTER — Ambulatory Visit: Payer: Medicaid Other | Attending: Orthopedic Surgery | Admitting: Physical Therapy

## 2016-08-25 ENCOUNTER — Encounter: Payer: Self-pay | Admitting: Physical Therapy

## 2016-08-25 DIAGNOSIS — M545 Low back pain, unspecified: Secondary | ICD-10-CM

## 2016-08-25 DIAGNOSIS — R2689 Other abnormalities of gait and mobility: Secondary | ICD-10-CM | POA: Insufficient documentation

## 2016-08-25 DIAGNOSIS — G8929 Other chronic pain: Secondary | ICD-10-CM | POA: Diagnosis present

## 2016-08-25 DIAGNOSIS — M6281 Muscle weakness (generalized): Secondary | ICD-10-CM | POA: Diagnosis present

## 2016-08-25 NOTE — Therapy (Signed)
[facility] [address] [place], [place], [address] Phone: [phone]   Fax:  [phone]  Physical Therapy Evaluation  Patient Details  Name: Kathryn Ramirez MRN: [id number] Date of Birth: 1951/01/11 Referring Provider: Marko Plume, MD  Encounter Date: 08/25/2016      PT End of Session - 08/25/16 1045    Visit Number 1   Number of Visits 1   Date for PT Re-Evaluation 08/26/16   PT Start Time 0940   PT Stop Time [id number]   PT Time Calculation (min) 61 min   Activity Tolerance Patient tolerated treatment well;No increased pain;Patient limited by pain   Behavior During Therapy [facility] for tasks assessed/performed      Past Medical History:  Diagnosis Date  . Acid reflux disease   . Bipolar 1 disorder ([place])   . Liver hemangioma   . Lung nodule   . Psoriasis   . Rheumatoid arthritis ([place])   . Tardive dyskinesia     Past Surgical History:  Procedure Laterality Date  . FLEXIBLE BRONCHOSCOPY N/A 01/13/2016   Procedure: FLEXIBLE BRONCHOSCOPY;  Surgeon: Vilinda Boehringer, MD;  Location: [facility] ORS;  Service: Cardiopulmonary;  Laterality: N/A;  . NEUROPLASTY MAJOR NERVE    . POLYPECTOMY    . THROAT SURGERY      There were no vitals filed for this visit.       Subjective Assessment - 08/25/16 1003    Subjective Pt. reports she is doing better this week from her normal 5/10 pain. Today she reports 3/10 pain in central low back.    Limitations Sitting;Lifting;Standing;Walking   Currently in Pain? Yes   Pain Score 3    Pain Location Back   Pain Orientation Left;Right   Pain Descriptors / Indicators Constant   Pain Type Chronic pain   Pain Onset More than a month ago   Pain Frequency Constant   Aggravating Factors  bending forward   Pain Relieving Factors sitting up straight            [facility] PT Assessment - 08/25/16 0001      Assessment   Medical Diagnosis lumbar spinal stenosis   Referring Provider Marko Plume,  MD   Onset Date/Surgical Date 07/26/98     Oswestry: 38%  Issued pgs. 1-2 of core home program Discussed proper lifting body mechanics Issued stretching HIP for lumbar spine (rotn/flexion)          PT Education - 08/25/16 1042    Education provided Yes   Education Details see handouts    Person(s) Educated Patient   Methods Explanation;Demonstration;Tactile cues;Verbal cues   Comprehension Verbalized understanding;Returned demonstration             Plan - 08/25/16 1046    Clinical Impression Statement Pt presents to physical therapy evaluation for chronic low back pain affecting activities of daily living such as cleaning, with pain increased upon waking up or after aerobic activities. Upon evaluation tight thoracolumbar muscular bilaterally was noted with weak abdominal control. No directional preference was noted for pain relief, only right rotation did not cause pain. TA contraction, lumbar stretches, and back support created relief. Patient was educated on safe lifting body mechanics from floor and for reaching from eye level shelf.  Poor abdominal control was noted with increased requirements of tactile and verbal cueing that improved with repetition. Pt. was issued HEP and demonstrated understanding.    Rehab Potential Fair   PT Frequency 1x / week  PT Treatment/Interventions ADLs/Self Care Home Management;Cryotherapy;Electrical Stimulation;Moist Heat;Gait training;Stair training;Functional mobility training;Therapeutic activities;Therapeutic exercise;Balance training;Neuromuscular re-education;Patient/family education;Manual techniques;Passive range of motion;Energy conservation   PT Next Visit Plan SEE HEP.  Pt. instructed to call PT if any issues/ questions.    PT Home Exercise Plan see handouts    Consulted and Agree with Plan of Care Patient      Patient will benefit from skilled therapeutic intervention in order to improve the following deficits and impairments:   Abnormal gait, Decreased balance, Decreased mobility, Difficulty walking, Decreased range of motion, Improper body mechanics, Obesity, Decreased activity tolerance, Decreased safety awareness, Decreased strength, Hypomobility, Postural dysfunction, Pain  Visit Diagnosis: Chronic bilateral low back pain without sciatica  Muscle weakness (generalized)  Other abnormalities of gait and mobility     Problem List Patient Active Problem List   Diagnosis Date Noted  . History of hypothyroidism 08/19/2016  . Neck swelling 08/19/2016  . Acute asthma exacerbation 08/19/2016  . GERD (gastroesophageal reflux disease) 08/09/2016  . 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 Chest pain 04/06/2016  . Impairment of balance 02/05/2016  . Acute right-sided low back pain without sciatica 02/05/2016  . Normal blood pressure 01/23/2016  . Pulmonary infiltrates   . Chronic cough   . Pulmonary scarring 01/06/2016  . OAB (overactive bladder) 12/18/2015  . Asthma 09/18/2015  . Multiple pulmonary nodules 09/15/2015  . Bipolar disorder with moderate depression ([place]) 12/19/2014  . Chronic pain 12/19/2014   Pura Spice, PT, DPT # [id number] Willodean Rosenthal, SPT 08/25/2016, 6:19 PM   [facility] [address] [place], [place], [address] Phone: [phone]   Fax:  [phone]  Name: Kathryn Ramirez MRN: [id number] Date of Birth: 1950/09/13

## 2016-08-25 NOTE — Patient Instructions (Signed)
   MAT PROGRESSION Supine Transverse Abdominus (T.A) . Lie on your back with your knees bent, feet flat on the floor.  Pull your belly-button inwards towards your spine to activate the deep abdominal musculature.  Hold ____ sec./ ____ repetitions/_____sets.  CAUTION:  If you find yourself holding your breath you may be activating other muscles in the abdomen that do not require strengthening; relax and try again.    Supine Posterior Pelvic Tilt with T.A . Lie on your back with your knees bent, feet flat on the floor.  Pull your belly-button inwards towards your spine to activate the deep abdominal musculature then curl your hips underneath your body so your back is flat against the table.  Hold ____ sec./ ____ repetitions/_____sets.      Supine heel slide with T.A . Lie on your back with your knees bent, feet flat on the floor.  Pull your belly-button inwards towards your spine to activate the deep abdominal musculature.  Slide one heel along the table straightening the knee while keeping your abdominals tight, back flat against the table, and no movement at the hips/pelvis; return to the starting position.  Repeat with the opposite lower extremity.   ____ repetitions/_____sets.         Supine march with T.A . Lie on your back with your knees bent, feet flat on the floor.  Pull your belly-button inwards towards your spine to activate the deep abdominal musculature.  While maintaining your abdominal contraction, slowly lift one leg off the table no more than 3 inches; return to the starting position.  Repeat with the opposite leg.   ____ repetitions/_____sets.       Supine bridge with T.A . Lie on your back with your knees bent, feet flat on the floor.  Pull your belly-button inwards towards your spine to activate the deep abdominal musculature.  Slowly lift your hips and buttocks off the table keeping your abdominal muscles tight.  Hold ___ sec/____reps/____sets

## 2016-09-01 ENCOUNTER — Ambulatory Visit: Payer: Medicaid Other | Admitting: Gastroenterology

## 2016-09-02 ENCOUNTER — Other Ambulatory Visit: Payer: Self-pay

## 2016-09-02 ENCOUNTER — Ambulatory Visit (INDEPENDENT_AMBULATORY_CARE_PROVIDER_SITE_OTHER): Payer: Medicaid Other | Admitting: Gastroenterology

## 2016-09-02 ENCOUNTER — Encounter: Payer: Self-pay | Admitting: Gastroenterology

## 2016-09-02 VITALS — BP 119/72 | HR 84 | Temp 98.1°F | Ht 64.0 in | Wt 208.0 lb

## 2016-09-02 DIAGNOSIS — F458 Other somatoform disorders: Secondary | ICD-10-CM

## 2016-09-02 DIAGNOSIS — K219 Gastro-esophageal reflux disease without esophagitis: Secondary | ICD-10-CM | POA: Diagnosis not present

## 2016-09-02 DIAGNOSIS — E785 Hyperlipidemia, unspecified: Secondary | ICD-10-CM | POA: Insufficient documentation

## 2016-09-02 DIAGNOSIS — M1990 Unspecified osteoarthritis, unspecified site: Secondary | ICD-10-CM | POA: Insufficient documentation

## 2016-09-02 DIAGNOSIS — I10 Essential (primary) hypertension: Secondary | ICD-10-CM | POA: Insufficient documentation

## 2016-09-02 DIAGNOSIS — G2401 Drug induced subacute dyskinesia: Secondary | ICD-10-CM | POA: Insufficient documentation

## 2016-09-02 DIAGNOSIS — R32 Unspecified urinary incontinence: Secondary | ICD-10-CM | POA: Insufficient documentation

## 2016-09-02 DIAGNOSIS — B182 Chronic viral hepatitis C: Secondary | ICD-10-CM | POA: Insufficient documentation

## 2016-09-02 NOTE — Progress Notes (Signed)
Gastroenterology Consultation  Referring Provider:     Roselee Nova, MD Primary Care Physician:  Keith Rake, MD Primary Gastroenterologist:  Dr. Allen Norris     Reason for Consultation:     GERD        HPI:   Kathryn Ramirez is a 66 y.o. y/o female referred for consultation & management of GERD by Dr. Keith Rake, MD.  This patient comes today with a history of GERD and she is in need of a colonoscopy. The patient has a history of being seen by gastroenterology at [facility] for abdominal pain and was to undergo an upper endoscopy by them. The patient was also seen by a nurse practitioner at the [facility] clinic for liver lesions that were found to be hemangiomas. The patient is not a very good historian and is not sure if she actually underwent an upper endoscopy. The patient is concerned because her mother had esophageal cancer. She also reports that she has a fullness in her throat and has lumps in her neck. She does report that she had a colonoscopy 5 years ago in [place] and had polyps at that time.  Past Medical History:  Diagnosis Date  . Acid reflux disease   . Bipolar 1 disorder ([place])   . Liver hemangioma   . Lung nodule   . Psoriasis   . Rheumatoid arthritis ([place])   . Tardive dyskinesia     Past Surgical History:  Procedure Laterality Date  . FLEXIBLE BRONCHOSCOPY N/A 01/13/2016   Procedure: FLEXIBLE BRONCHOSCOPY;  Surgeon: Vilinda Boehringer, MD;  Location: [facility] ORS;  Service: Cardiopulmonary;  Laterality: N/A;  . NEUROPLASTY MAJOR NERVE    . POLYPECTOMY    . THROAT SURGERY      Prior to Admission medications   Medication Sig Start Date End Date Taking? Authorizing Provider  albuterol (PROVENTIL HFA;VENTOLIN HFA) 108 (90 Base) MCG/ACT inhaler Inhale 1-2 puffs into the lungs every 6 (six) hours as needed for wheezing or shortness of breath. 02/18/16   Roselee Nova, MD  ARIPiprazole (ABILIFY) 10 MG tablet Take 10 mg by mouth daily.    Historical Provider, MD    beclomethasone (QVAR) 40 MCG/ACT inhaler Inhale 1 puff into the lungs 2 (two) times daily. 05/18/16   Vishal Mungal, MD  buPROPion (WELLBUTRIN XL) 150 MG 24 hr tablet Take 150 mg by mouth daily.    Historical Provider, MD  esomeprazole (NEXIUM) 40 MG capsule Take 1 capsule (40 mg total) by mouth daily. 08/11/16   Roselee Nova, MD  gabapentin (NEURONTIN) 300 MG capsule Take 300 mg by mouth 2 (two) times daily.    Historical Provider, MD  predniSONE (DELTASONE) 50 MG tablet 1 tablet daily x 5 days. Patient not taking: Reported on 08/19/2016 08/15/16   Coral Spikes, DO  traMADol (ULTRAM) 50 MG tablet Take 50 mg by mouth 2 (two) times daily.  04/28/15   Historical Provider, MD  traZODone (DESYREL) 50 MG tablet Take 50-150 mg by mouth at bedtime.     Historical Provider, MD  VESICARE 10 MG tablet TAKE 1 TABLET BY MOUTH DAILY 08/02/16   Roselee Nova, MD    Family History  Problem Relation Age of Onset  . Cancer Mother     Esophageal Ca  . Asthma Brother   . Breast cancer Neg Hx      Social History  Substance Use Topics  . Smoking status: Former Smoker  Types: E-cigarettes    Quit date: 09/19/2015  . Smokeless tobacco: Never Used  . Alcohol use No     Comment: 03/31/2012 sobierty     Allergies as of 09/02/2016 - Review Complete 08/25/2016  Allergen Reaction Noted  . Codeine Nausea Only 07/02/2015  . Haldol [haloperidol] Other (See Comments) 12/18/2014  . Haloperidol lactate Other (See Comments) 07/02/2015  . Trileptal [oxcarbazepine] Swelling 12/18/2014    Review of Systems:    All systems reviewed and negative except where noted in HPI.   Physical Exam:  There were no vitals taken for this visit. No LMP recorded. Patient is postmenopausal. Psych:  Alert and cooperative. Normal mood and affect. General:   Alert,  Well-developed, well-nourished, pleasant and cooperative in NAD Head:  Normocephalic and atraumatic. Eyes:  Sclera clear, no icterus.   Conjunctiva pink. Ears:   Normal auditory acuity. Nose:  No deformity, discharge, or lesions. Mouth:  No deformity or lesions,oropharynx pink & moist. Neck:  Supple; no masses or thyromegaly. Lungs:  Respirations even and unlabored.  Clear throughout to auscultation.   No wheezes, crackles, or rhonchi. No acute distress. Heart:  Regular rate and rhythm; no murmurs, clicks, rubs, or gallops. Abdomen:  Normal bowel sounds.  No bruits.  Soft, non-tender and non-distended without masses, hepatosplenomegaly or hernias noted.  No guarding or rebound tenderness.  Negative Carnett sign.   Rectal:  Deferred.  Msk:  Symmetrical without gross deformities.  Good, equal movement & strength bilaterally. Pulses:  Normal pulses noted. Extremities:  No clubbing or edema.  No cyanosis. Neurologic:  Alert and oriented x3;  grossly normal neurologically. Skin:  Intact without significant lesions or rashes.  No jaundice. Lymph Nodes:  No significant cervical adenopathy. Psych:  Alert and cooperative. Normal mood and affect.  Imaging Studies: Dg Chest 2 View  Result Date: 08/15/2016 CLINICAL DATA:  Cough and shortness of breath, 3 days duration. History of lung nodules. EXAM: CHEST  2 VIEW COMPARISON:  CT 12/10/2015.  Radiography 09/01/2015. FINDINGS: Heart size is normal. There is aortic atherosclerosis. Mild pleural and parenchymal scarring in the lower lungs. No sign of active infiltrate, mass, effusion or collapse. Ordinary degenerative changes affect the spine. Previous cervical ACDF. IMPRESSION: No active disease. Pleural and parenchymal scarring in the lower chest. Electronically Signed   By: Nelson Chimes M.D.   On: 08/15/2016 15:20    Assessment and Plan:   Kathryn Ramirez is a 66 y.o. y/o female who comes in today with a history of polyps and a fullness in the throat. She also reports that she has had reflux symptoms. The patient had been seen in the past at the [place] clinic for a hemangioma. The patient will be set up for  a EGD and colonoscopy due to her heartburn, [place] and because of her history of colon polyps.I have discussed risks & benefits which include, but are not limited to, bleeding, infection, perforation & drug reaction.  The patient agrees with this plan & written consent will be obtained.       Lucilla Lame, MD. Marval Regal   Note: This dictation was prepared with Dragon dictation along with smaller phrase technology. Any transcriptional errors that result from this process are unintentional.

## 2016-09-06 ENCOUNTER — Other Ambulatory Visit: Payer: Self-pay

## 2016-09-06 ENCOUNTER — Encounter: Payer: Self-pay | Admitting: *Deleted

## 2016-09-06 ENCOUNTER — Encounter: Payer: Self-pay | Admitting: Anesthesiology

## 2016-09-07 ENCOUNTER — Other Ambulatory Visit: Payer: Self-pay

## 2016-09-07 ENCOUNTER — Ambulatory Visit: Payer: Medicaid Other | Admitting: Pulmonary Disease

## 2016-09-07 ENCOUNTER — Ambulatory Visit (INDEPENDENT_AMBULATORY_CARE_PROVIDER_SITE_OTHER): Payer: Medicaid Other | Admitting: Internal Medicine

## 2016-09-07 ENCOUNTER — Encounter: Payer: Self-pay | Admitting: Internal Medicine

## 2016-09-07 VITALS — BP 126/74 | HR 77 | Wt 208.0 lb

## 2016-09-07 DIAGNOSIS — J4530 Mild persistent asthma, uncomplicated: Secondary | ICD-10-CM | POA: Diagnosis not present

## 2016-09-07 NOTE — Patient Instructions (Signed)
--  Will give sample of Anoro inhaler to be used once daily.   --Stop qvar and use Anoro once daily instead.

## 2016-09-09 ENCOUNTER — Ambulatory Visit: Admit: 2016-09-09 | Payer: Medicaid Other | Admitting: Gastroenterology

## 2016-09-09 HISTORY — DX: Presence of dental prosthetic device (complete) (partial): Z97.2

## 2016-09-09 HISTORY — DX: Unspecified asthma, uncomplicated: J45.909

## 2016-09-09 SURGERY — COLONOSCOPY WITH PROPOFOL
Anesthesia: Choice

## 2016-09-13 ENCOUNTER — Other Ambulatory Visit: Payer: Self-pay

## 2016-09-13 MED ORDER — PEG 3350-KCL-NABCB-NACL-NASULF 236 G PO SOLR: ORAL | 0 refills | Status: DC

## 2016-09-14 ENCOUNTER — Ambulatory Visit
Admission: RE | Admit: 2016-09-14 | Discharge: 2016-09-14 | Disposition: A | Discharge status: Home / Self Care | Payer: Medicaid Other | Source: Ambulatory Visit | Attending: Gastroenterology | Admitting: Gastroenterology

## 2016-09-14 ENCOUNTER — Ambulatory Visit: Payer: Medicaid Other | Admitting: Anesthesiology

## 2016-09-14 ENCOUNTER — Encounter
Admission: RE | Disposition: A | Discharge status: Home / Self Care | Payer: Self-pay | Source: Ambulatory Visit | Attending: Gastroenterology

## 2016-09-14 ENCOUNTER — Other Ambulatory Visit: Payer: Self-pay

## 2016-09-14 ENCOUNTER — Encounter: Payer: Self-pay | Admitting: *Deleted

## 2016-09-14 DIAGNOSIS — D1803 Hemangioma of intra-abdominal structures: Secondary | ICD-10-CM | POA: Diagnosis not present

## 2016-09-14 DIAGNOSIS — Z803 Family history of malignant neoplasm of breast: Secondary | ICD-10-CM | POA: Diagnosis not present

## 2016-09-14 DIAGNOSIS — Z7951 Long term (current) use of inhaled steroids: Secondary | ICD-10-CM | POA: Diagnosis not present

## 2016-09-14 DIAGNOSIS — R911 Solitary pulmonary nodule: Secondary | ICD-10-CM | POA: Insufficient documentation

## 2016-09-14 DIAGNOSIS — Z825 Family history of asthma and other chronic lower respiratory diseases: Secondary | ICD-10-CM | POA: Insufficient documentation

## 2016-09-14 DIAGNOSIS — Z860100 Personal history of colon polyps, unspecified: Secondary | ICD-10-CM

## 2016-09-14 DIAGNOSIS — J45909 Unspecified asthma, uncomplicated: Secondary | ICD-10-CM | POA: Insufficient documentation

## 2016-09-14 DIAGNOSIS — Z800 Family history of malignant neoplasm of digestive organs: Secondary | ICD-10-CM | POA: Insufficient documentation

## 2016-09-14 DIAGNOSIS — K5730 Diverticulosis of large intestine without perforation or abscess without bleeding: Secondary | ICD-10-CM | POA: Insufficient documentation

## 2016-09-14 DIAGNOSIS — Z885 Allergy status to narcotic agent status: Secondary | ICD-10-CM | POA: Insufficient documentation

## 2016-09-14 DIAGNOSIS — Z87891 Personal history of nicotine dependence: Secondary | ICD-10-CM | POA: Insufficient documentation

## 2016-09-14 DIAGNOSIS — K219 Gastro-esophageal reflux disease without esophagitis: Secondary | ICD-10-CM | POA: Insufficient documentation

## 2016-09-14 DIAGNOSIS — Z961 Presence of intraocular lens: Secondary | ICD-10-CM | POA: Insufficient documentation

## 2016-09-14 DIAGNOSIS — Z9842 Cataract extraction status, left eye: Secondary | ICD-10-CM | POA: Insufficient documentation

## 2016-09-14 DIAGNOSIS — Z1211 Encounter for screening for malignant neoplasm of colon: Secondary | ICD-10-CM | POA: Insufficient documentation

## 2016-09-14 DIAGNOSIS — Z888 Allergy status to other drugs, medicaments and biological substances status: Secondary | ICD-10-CM | POA: Insufficient documentation

## 2016-09-14 DIAGNOSIS — M069 Rheumatoid arthritis, unspecified: Secondary | ICD-10-CM | POA: Insufficient documentation

## 2016-09-14 DIAGNOSIS — K2970 Gastritis, unspecified, without bleeding: Secondary | ICD-10-CM

## 2016-09-14 DIAGNOSIS — R12 Heartburn: Secondary | ICD-10-CM | POA: Insufficient documentation

## 2016-09-14 DIAGNOSIS — K640 First degree hemorrhoids: Secondary | ICD-10-CM | POA: Diagnosis not present

## 2016-09-14 DIAGNOSIS — Z79899 Other long term (current) drug therapy: Secondary | ICD-10-CM | POA: Insufficient documentation

## 2016-09-14 DIAGNOSIS — Z9841 Cataract extraction status, right eye: Secondary | ICD-10-CM | POA: Insufficient documentation

## 2016-09-14 DIAGNOSIS — K2950 Unspecified chronic gastritis without bleeding: Secondary | ICD-10-CM | POA: Insufficient documentation

## 2016-09-14 DIAGNOSIS — F319 Bipolar disorder, unspecified: Secondary | ICD-10-CM | POA: Diagnosis not present

## 2016-09-14 DIAGNOSIS — Z9889 Other specified postprocedural states: Secondary | ICD-10-CM | POA: Insufficient documentation

## 2016-09-14 HISTORY — PX: ESOPHAGOGASTRODUODENOSCOPY (EGD) WITH PROPOFOL: SHX5813

## 2016-09-14 HISTORY — PX: COLONOSCOPY WITH PROPOFOL: SHX5780

## 2016-09-14 SURGERY — COLONOSCOPY WITH PROPOFOL
Anesthesia: General

## 2016-09-14 MED ORDER — PROPOFOL 10 MG/ML IV BOLUS
INTRAVENOUS | Status: DC | PRN
  Administered 2016-09-14: 50 mg via INTRAVENOUS

## 2016-09-14 MED ORDER — PROPOFOL 10 MG/ML IV BOLUS
INTRAVENOUS | Status: AC
  Filled 2016-09-14: qty 20

## 2016-09-14 MED ORDER — GLYCOPYRROLATE 0.2 MG/ML IJ SOLN
INTRAMUSCULAR | Status: DC | PRN
  Administered 2016-09-14: 0.2 mg via INTRAVENOUS

## 2016-09-14 MED ORDER — LIDOCAINE HCL (CARDIAC) 20 MG/ML IV SOLN
INTRAVENOUS | Status: DC | PRN
  Administered 2016-09-14: 100 mg via INTRAVENOUS

## 2016-09-14 MED ORDER — PROPOFOL 500 MG/50ML IV EMUL
INTRAVENOUS | Status: DC | PRN
  Administered 2016-09-14: 150 ug/kg/min via INTRAVENOUS

## 2016-09-14 MED ORDER — SODIUM CHLORIDE 0.9 % IV SOLN
INTRAVENOUS | Status: DC
  Administered 2016-09-14: 09:00:00 via INTRAVENOUS

## 2016-09-14 NOTE — Anesthesia Postprocedure Evaluation (Signed)
Anesthesia Post Note  Patient: Kathryn Ramirez  Procedure(s) Performed: Procedure(s) (LRB): COLONOSCOPY WITH PROPOFOL (N/A) ESOPHAGOGASTRODUODENOSCOPY (EGD) WITH PROPOFOL (N/A)  Patient location during evaluation: Endoscopy Anesthesia Type: General Level of consciousness: awake and alert Pain management: pain level controlled Vital Signs Assessment: post-procedure vital signs reviewed and stable Respiratory status: spontaneous breathing, nonlabored ventilation and respiratory function stable Cardiovascular status: blood pressure returned to baseline and stable Postop Assessment: no signs of nausea or vomiting Anesthetic complications: no     Last Vitals:  Vitals:   09/14/16 1110 09/14/16 1130  BP: 125/77 (!) 133/93  Pulse: 85 91  Resp: 14 14  Temp:      Last Pain:  Vitals:   09/14/16 1100  TempSrc: Tympanic  PainSc:                  Martha Clan

## 2016-09-14 NOTE — Anesthesia Preprocedure Evaluation (Signed)
Anesthesia Evaluation  Patient identified by MRN, date of birth, ID band Patient awake    Reviewed: Allergy & Precautions, H&P , NPO status , Patient's Chart, lab work & pertinent test results, reviewed documented beta blocker date and time   History of Anesthesia Complications Negative for: history of anesthetic complications  Airway Mallampati: I  TM Distance: >3 FB Neck ROM: full    Dental no notable dental hx. (+) Edentulous Upper, Edentulous Lower, Upper Dentures, Lower Dentures   Pulmonary neg shortness of breath, asthma , neg sleep apnea, neg COPD, neg recent URI, former smoker,           Cardiovascular Exercise Tolerance: Good hypertension, (-) angina(-) CAD, (-) Past MI, (-) Cardiac Stents and (-) CABG (-) dysrhythmias (-) Valvular Problems/Murmurs     Neuro/Psych neg Seizures PSYCHIATRIC DISORDERS (Bipolar)  Neuromuscular disease (Tardive dyskinesia) negative psych ROS   GI/Hepatic GERD  ,(+) Hepatitis -, C  Endo/Other  negative endocrine ROS  Renal/GU negative Renal ROS  negative genitourinary   Musculoskeletal   Abdominal   Peds  Hematology negative hematology ROS (+)   Anesthesia Other Findings Past Medical History: No date: Acid reflux disease No date: Asthma No date: Bipolar 1 disorder ([facility]) No date: Liver hemangioma No date: Lung nodule No date: Psoriasis No date: Rheumatoid arthritis ([facility]) No date: Tardive dyskinesia No date: Wears dentures     Comment: full upper and lower   Reproductive/Obstetrics negative OB ROS                             Anesthesia Physical Anesthesia Plan  ASA: III  Anesthesia Plan: General   Post-op Pain Management:    Induction:   Airway Management Planned:   Additional Equipment:   Intra-op Plan:   Post-operative Plan:   Informed Consent: I have reviewed the patients History and Physical, chart, labs and discussed the procedure  including the risks, benefits and alternatives for the proposed anesthesia with the patient or authorized representative who has indicated his/her understanding and acceptance.   Dental Advisory Given  Plan Discussed with: Anesthesiologist, CRNA and Surgeon  Anesthesia Plan Comments:         Anesthesia Quick Evaluation

## 2016-09-14 NOTE — Op Note (Addendum)
[facility] Patient Name: Kathryn Ramirez Procedure Date: 09/14/2016 10:31 AM MRN: [id number] Account #: [id number] Date of Birth: 10-21-1950 Admit Type: Outpatient Age: 66 Room: [facility] ENDO ROOM 4 Gender: Female Note Status: Finalized Procedure:            Upper GI endoscopy Indications:          Heartburn Providers:            Lucilla Lame MD, MD Referring MD:         Monico Blitz (Referring MD) Medicines:            Propofol per Anesthesia Complications:        No immediate complications. Procedure:            Pre-Anesthesia Assessment:                       - Prior to the procedure, a History and Physical was                        performed, and patient medications and allergies were                        reviewed. The patient's tolerance of previous                        anesthesia was also reviewed. The risks and benefits of                        the procedure and the sedation options and risks were                        discussed with the patient. All questions were                        answered, and informed consent was obtained. Prior                        Anticoagulants: The patient has taken no previous                        anticoagulant or antiplatelet agents. ASA Grade                        Assessment: II - A patient with mild systemic disease.                        After reviewing the risks and benefits, the patient was                        deemed in satisfactory condition to undergo the                        procedure.                       After obtaining informed consent, the endoscope was                        passed under direct vision. Throughout the procedure,  the patient's blood pressure, pulse, and oxygen                        saturations were monitored continuously. The Endoscope                        was introduced through the mouth, and advanced to the                        second  part of duodenum. The upper GI endoscopy was                        accomplished without difficulty. The patient tolerated                        the procedure well. Findings:      The examined esophagus was normal.      Localized mild inflammation characterized by erythema was found in the       gastric antrum. Biopsies were taken with a cold forceps for histology.      The examined duodenum was normal. Impression:           - Normal esophagus.                       - Gastritis. Biopsied.                       - Normal examined duodenum. Recommendation:       - Discharge patient to home.                       - Resume previous diet.                       - Continue present medications.                       - Perform a colonoscopy today. Procedure Code(s):    --- Professional ---                       [phone], Esophagogastroduodenoscopy, flexible, transoral;                        with biopsy, single or multiple Diagnosis Code(s):    --- Professional ---                       R12, Heartburn                       K29.70, Gastritis, unspecified, without bleeding CPT copyright 2016 American Medical [employer]. All rights reserved. The codes documented in this report are preliminary and upon coder review may  be revised to meet current compliance requirements. Lucilla Lame MD, MD 09/14/2016 10:48:26 AM This report has been signed electronically. Number of Addenda: 0 Note Initiated On: 09/14/2016 10:31 AM      [facility]

## 2016-09-14 NOTE — Op Note (Addendum)
[facility] Gastroenterology Patient Name: Kathryn Ramirez Procedure Date: 09/14/2016 10:29 AM MRN: [id number] Account #: [id number] Date of Birth: [birth year] Admit Type: Outpatient Age: 66 Room: [facility] ENDO ROOM 4 Gender: Female Note Status: Finalized Procedure:            Colonoscopy Indications:          High risk colon cancer surveillance: Personal history                        of colonic polyps Providers:            Lucilla Lame MD, MD Medicines:            Propofol per Anesthesia Complications:        No immediate complications. Procedure:            Pre-Anesthesia Assessment:                       - Prior to the procedure, a History and Physical was                        performed, and patient medications and allergies were                        reviewed. The patient's tolerance of previous                        anesthesia was also reviewed. The risks and benefits of                        the procedure and the sedation options and risks were                        discussed with the patient. All questions were                        answered, and informed consent was obtained. Prior                        Anticoagulants: The patient has taken no previous                        anticoagulant or antiplatelet agents. ASA Grade                        Assessment: II - A patient with mild systemic disease.                        After reviewing the risks and benefits, the patient was                        deemed in satisfactory condition to undergo the                        procedure.                       After obtaining informed consent, the colonoscope was                        passed under direct vision. Throughout the  procedure,                        the patient's blood pressure, pulse, and oxygen                        saturations were monitored continuously. The Olympus                        CF-HQ190L Colonoscope (S#. [id number] [phone]) was introduced                  through the anus and advanced to the the cecum,                        identified by appendiceal orifice and ileocecal valve.                        The colonoscopy was performed without difficulty. The                        patient tolerated the procedure well. The quality of                        the bowel preparation was excellent. Findings:      The perianal and digital rectal examinations were normal.      Multiple small-mouthed diverticula were found in the sigmoid colon.      Non-bleeding internal hemorrhoids were found during retroflexion. The       hemorrhoids were Grade I (internal hemorrhoids that do not prolapse). Impression:           - Diverticulosis in the sigmoid colon.                       - Non-bleeding internal hemorrhoids.                       - No specimens collected. Recommendation:       - Discharge patient to home.                       - Discharge patient to home.                       - Resume previous diet.                       - Continue present medications.                       - Repeat colonoscopy in 5 years for surveillance. Procedure Code(s):    --- Professional ---                       [phone], Colonoscopy, flexible; diagnostic, including                        collection of specimen(s) by brushing or washing, when                        performed (separate procedure) Diagnosis Code(s):    --- Professional ---  Z86.010, Personal history of colonic polyps CPT copyright 2016 American Medical Association. All rights reserved. The codes documented in this report are preliminary and upon coder review may  be revised to meet current compliance requirements. Lucilla Lame MD, MD 09/14/2016 11:05:24 AM This report has been signed electronically. Number of Addenda: 0 Note Initiated On: 09/14/2016 10:29 AM Scope Withdrawal Time: 0 hours 6 minutes 48 seconds  Total Procedure Duration: 0 hours 12 minutes 34 seconds        [facility]

## 2016-09-14 NOTE — Transfer of Care (Signed)
Immediate Anesthesia Transfer of Care Note  Patient: Kathryn Ramirez  Procedure(s) Performed: Procedure(s): COLONOSCOPY WITH PROPOFOL (N/A) ESOPHAGOGASTRODUODENOSCOPY (EGD) WITH PROPOFOL (N/A)  Patient Location: Endoscopy Unit  Anesthesia Type:General  Level of Consciousness: awake and alert   Airway & Oxygen Therapy: Patient Spontanous Breathing and Patient connected to nasal cannula oxygen  Post-op Assessment: Report given to RN and Post -op Vital signs reviewed and stable  Post vital signs: Reviewed and stable  Last Vitals:  Vitals:   09/14/16 0846  BP: 129/85  Pulse: 96  Resp: 20  Temp: 37.3 C    Last Pain:  Vitals:   09/14/16 0846  TempSrc: Tympanic  PainSc: 5       Patients Stated Pain Goal: 2 ([id number])  Complications: No apparent anesthesia complications

## 2016-09-14 NOTE — H&P (Signed)
Lucilla Lame, MD [facility] [address] [place], [place] [address] Phone: [phone] Fax : [phone]  Primary Care Physician:  Keith Rake, MD Primary Gastroenterologist:  Dr. Allen Norris  Pre-Procedure History & Physical: HPI:  Kathryn Ramirez is a 66 y.o. female is here for an endoscopy and colonoscopy.   Past Medical History:  Diagnosis Date  . Acid reflux disease   . Asthma   . Bipolar 1 disorder ([place])   . Liver hemangioma   . Lung nodule   . Psoriasis   . Rheumatoid arthritis ([place])   . Tardive dyskinesia   . Wears dentures    full upper and lower    Past Surgical History:  Procedure Laterality Date  . CATARACT EXTRACTION W/ INTRAOCULAR LENS  IMPLANT, BILATERAL    . CERVICAL SPINE SURGERY  2017   [facility] [place]  . FLEXIBLE BRONCHOSCOPY N/A 01/13/2016   Procedure: FLEXIBLE BRONCHOSCOPY;  Surgeon: Vilinda Boehringer, MD;  Location: [facility] ORS;  Service: Cardiopulmonary;  Laterality: N/A;  . NEUROPLASTY MAJOR NERVE    . POLYPECTOMY    . THROAT SURGERY      Prior to Admission medications   Medication Sig Start Date End Date Taking? Authorizing Provider  albuterol (PROVENTIL HFA;VENTOLIN HFA) 108 (90 Base) MCG/ACT inhaler Inhale 1-2 puffs into the lungs every 6 (six) hours as needed for wheezing or shortness of breath. 02/18/16  Yes Roselee Nova, MD  ARIPiprazole (ABILIFY) 10 MG tablet Take 10 mg by mouth daily.   Yes Historical Provider, MD  buPROPion (WELLBUTRIN XL) 150 MG 24 hr tablet Take 150 mg by mouth daily.   Yes Historical Provider, MD  esomeprazole (NEXIUM) 40 MG capsule Take 1 capsule (40 mg total) by mouth daily. 08/11/16  Yes Roselee Nova, MD  gabapentin (NEURONTIN) 300 MG capsule Take 300 mg by mouth 2 (two) times daily.   Yes Historical Provider, MD  polyethylene glycol (GOLYTELY) 236 g solution Drink one 8 oz glass every 20 mins until stools are clear 09/13/16  Yes Lucilla Lame, MD  VESICARE 10 MG tablet TAKE 1 TABLET BY MOUTH DAILY 08/02/16  Yes Roselee Nova, MD  beclomethasone (QVAR) 40 MCG/ACT inhaler Inhale 1 puff into the lungs 2 (two) times daily. Patient not taking: Reported on 09/14/2016 05/18/16   Vilinda Boehringer, MD  traZODone (DESYREL) 50 MG tablet Take 50-150 mg by mouth at bedtime.     Historical Provider, MD    Allergies as of 09/07/2016 - Review Complete 09/07/2016  Allergen Reaction Noted  . Codeine Nausea Only 07/02/2015  . Haldol [haloperidol] Other (See Comments) 12/18/2014  . Haloperidol lactate Other (See Comments) 07/02/2015  . Trileptal [oxcarbazepine] Swelling 12/18/2014    Family History  Problem Relation Age of Onset  . Cancer Mother     Esophageal Ca  . Asthma Brother   . Breast cancer Neg Hx     Social History   Social History  . Marital status: Single    Spouse name: N/A  . Number of children: N/A  . Years of education: N/A   Occupational History  . Not on file.   Social History Main Topics  . Smoking status: Former Smoker    Types: E-cigarettes    Quit date: 09/19/2015  . Smokeless tobacco: Never Used  . Alcohol use No     Comment: 03/31/2012 sobierty   . Drug use: No  . Sexual activity: No   Other Topics Concern  . Not on file  Social History Narrative  . No narrative on file    Review of Systems: See HPI, otherwise negative ROS  Physical Exam: BP 129/85   Pulse 96   Temp 99.2 F (37.3 C) (Tympanic)   Resp 20   Ht 5\' 4"  (1.626 m)   Wt 208 lb (94.3 kg)   SpO2 96%   BMI 35.70 kg/m  General:   Alert,  pleasant and cooperative in NAD Head:  Normocephalic and atraumatic. Neck:  Supple; no masses or thyromegaly. Lungs:  Clear throughout to auscultation.    Heart:  Regular rate and rhythm. Abdomen:  Soft, nontender and nondistended. Normal bowel sounds, without guarding, and without rebound.   Neurologic:  Alert and  oriented x4;  grossly normal neurologically.  Impression/Plan: Kathryn Ramirez is here for an endoscopy and colonoscopy to be performed for history  of polyps and GERD  Risks, benefits, limitations, and alternatives regarding  endoscopy and colonoscopy have been reviewed with the patient.  Questions have been answered.  All parties agreeable.   Lucilla Lame, MD  09/14/2016, 10:22 AM

## 2016-09-14 NOTE — Anesthesia Post-op Follow-up Note (Signed)
Anesthesia QCDR form completed.        

## 2016-09-15 ENCOUNTER — Encounter: Payer: Self-pay | Admitting: Gastroenterology

## 2016-09-15 LAB — SURGICAL PATHOLOGY

## 2016-09-17 ENCOUNTER — Other Ambulatory Visit: Payer: Self-pay

## 2016-09-17 ENCOUNTER — Other Ambulatory Visit: Payer: Self-pay | Admitting: Family Medicine

## 2016-09-17 ENCOUNTER — Telehealth: Payer: Self-pay

## 2016-09-17 DIAGNOSIS — K219 Gastro-esophageal reflux disease without esophagitis: Secondary | ICD-10-CM

## 2016-09-17 MED ORDER — ESOMEPRAZOLE MAGNESIUM 40 MG PO CPDR: 40.0000 mg | DELAYED_RELEASE_CAPSULE | Freq: Every day | ORAL | 6 refills | Status: DC

## 2016-09-17 NOTE — Telephone Encounter (Signed)
Pt notified of results. Pt needed a refill on nexium. This was sent to her pharmacy.

## 2016-09-17 NOTE — Telephone Encounter (Signed)
-----   Message from Lucilla Lame, MD sent at 09/16/2016  2:34 PM EST ----- Let the patient know that the biopsies of her stomach showed inflammation without any sign of infection and healing of her inflammation.  She should continue her Nexium.

## 2016-09-21 ENCOUNTER — Telehealth: Payer: Self-pay | Admitting: Internal Medicine

## 2016-09-21 MED ORDER — UMECLIDINIUM-VILANTEROL 62.5-25 MCG/INH IN AEPB: 1.0000 | INHALATION_SPRAY | Freq: Every day | RESPIRATORY_TRACT | 4 refills | Status: DC

## 2016-09-21 NOTE — Telephone Encounter (Signed)
Pt states she hasn't picked up RX from pharmacy. States the [employer] sample she used and it helped. Will send in rx to pharmacy. Nothing further needed.

## 2016-09-21 NOTE — Telephone Encounter (Signed)
Pt states the Anoro doesn't agree with her, states she has to use her rescue inhaler more often when she takes this. States she has started taking Qvar. Please call.

## 2016-09-22 ENCOUNTER — Telehealth: Payer: Self-pay | Admitting: *Deleted

## 2016-09-22 MED ORDER — TIOTROPIUM BROMIDE-OLODATEROL 2.5-2.5 MCG/ACT IN AERS: 2.0000 | INHALATION_SPRAY | Freq: Every day | RESPIRATORY_TRACT | 5 refills | Status: DC

## 2016-09-22 NOTE — Telephone Encounter (Signed)
stiolto resp sent to pharmacy. Pt informed. Nothing further needed.

## 2016-09-22 NOTE — Telephone Encounter (Signed)
Initiated PA thru [facility] for [employer] that was sent for review. They states that preferred is Apiriva HH, Atrovent HFA or Stiolto Respimat.  Please advise if you would like to change.   [id number].

## 2016-09-22 NOTE — Telephone Encounter (Signed)
Will go with stiolto respimat once daily.

## 2016-10-05 ENCOUNTER — Other Ambulatory Visit: Payer: Self-pay | Admitting: Family Medicine

## 2016-10-05 ENCOUNTER — Telehealth: Payer: Self-pay | Admitting: Internal Medicine

## 2016-10-05 MED ORDER — ALBUTEROL SULFATE HFA 108 (90 BASE) MCG/ACT IN AERS: INHALATION_SPRAY | RESPIRATORY_TRACT | 2 refills | Status: DC

## 2016-10-05 NOTE — Telephone Encounter (Signed)
Spoke with pt and went over with her how to use the Stiolto since she had the medication change since last OV. Pt verbalized understanding. Nothing further needed.

## 2016-10-31 ENCOUNTER — Ambulatory Visit
Admission: EM | Admit: 2016-10-31 | Discharge: 2016-10-31 | Disposition: A | Discharge status: Home / Self Care | Payer: Medicaid Other | Attending: Family | Admitting: Family

## 2016-10-31 ENCOUNTER — Encounter: Payer: Self-pay | Admitting: Gynecology

## 2016-10-31 DIAGNOSIS — M069 Rheumatoid arthritis, unspecified: Secondary | ICD-10-CM | POA: Insufficient documentation

## 2016-10-31 DIAGNOSIS — K219 Gastro-esophageal reflux disease without esophagitis: Secondary | ICD-10-CM | POA: Diagnosis not present

## 2016-10-31 DIAGNOSIS — G2401 Drug induced subacute dyskinesia: Secondary | ICD-10-CM | POA: Diagnosis not present

## 2016-10-31 DIAGNOSIS — D1809 Hemangioma of other sites: Secondary | ICD-10-CM | POA: Diagnosis not present

## 2016-10-31 DIAGNOSIS — R109 Unspecified abdominal pain: Secondary | ICD-10-CM | POA: Diagnosis present

## 2016-10-31 DIAGNOSIS — J45909 Unspecified asthma, uncomplicated: Secondary | ICD-10-CM | POA: Insufficient documentation

## 2016-10-31 DIAGNOSIS — K5900 Constipation, unspecified: Secondary | ICD-10-CM | POA: Insufficient documentation

## 2016-10-31 DIAGNOSIS — Z87891 Personal history of nicotine dependence: Secondary | ICD-10-CM | POA: Insufficient documentation

## 2016-10-31 DIAGNOSIS — R911 Solitary pulmonary nodule: Secondary | ICD-10-CM | POA: Diagnosis not present

## 2016-10-31 DIAGNOSIS — R197 Diarrhea, unspecified: Secondary | ICD-10-CM | POA: Diagnosis not present

## 2016-10-31 DIAGNOSIS — F319 Bipolar disorder, unspecified: Secondary | ICD-10-CM | POA: Diagnosis not present

## 2016-10-31 MED ORDER — CIPROFLOXACIN HCL 500 MG PO TABS: 500.0000 mg | ORAL_TABLET | Freq: Two times a day (BID) | ORAL | 0 refills | Status: AC

## 2016-10-31 NOTE — ED Triage Notes (Signed)
Per patient diarrhea x 5 days. Patient stated 3-4 stool per night. Patient cannot recall any event that could have cause her diarrhea.

## 2016-10-31 NOTE — ED Provider Notes (Signed)
CSN: [id number]     Arrival date & time 10/31/16  1239 History   First MD Initiated Contact with Patient 10/31/16 1303     Chief Complaint  Patient presents with  . Diarrhea   (Consider location/radiation/quality/duration/timing/severity/associated sxs/prior Treatment) 66 year old female presents with loose stools for the past 5 days. Started with abdominal cramping and then loose stools in the middle of the night. Stool would be light brown and mostly not-formed. She did not see any blood in her stool. She denies any fever, nausea, vomiting, dysuria or lower abdominal pain. She has not traveled anywhere and no other family members or friends are ill. She does not recall eating any unusual foods. She has not taken any medication for symptoms yet. She recently had a screening colonoscopy at the end of February 2018 which was "normal" per patient. She also has a history of GERD and takes Nexium daily. She is on multiple medications for bipolar disorder which usually cause constipation and not diarrhea. She has not had a change in her medication recently.    The history is provided by the patient.    Past Medical History:  Diagnosis Date  . Acid reflux disease   . Asthma   . Bipolar 1 disorder ([place])   . Liver hemangioma   . Lung nodule   . Psoriasis   . Rheumatoid arthritis ([place])   . Tardive dyskinesia   . Wears dentures    full upper and lower   Past Surgical History:  Procedure Laterality Date  . CATARACT EXTRACTION W/ INTRAOCULAR LENS  IMPLANT, BILATERAL    . CERVICAL SPINE SURGERY  2017   [facility] [place]  . COLONOSCOPY WITH PROPOFOL N/A 09/14/2016   Procedure: COLONOSCOPY WITH PROPOFOL;  Surgeon: Lucilla Lame, MD;  Location: [facility] ENDOSCOPY;  Service: Endoscopy;  Laterality: N/A;  . ESOPHAGOGASTRODUODENOSCOPY (EGD) WITH PROPOFOL N/A 09/14/2016   Procedure: ESOPHAGOGASTRODUODENOSCOPY (EGD) WITH PROPOFOL;  Surgeon: Lucilla Lame, MD;  Location: [facility] ENDOSCOPY;  Service: Endoscopy;   Laterality: N/A;  . FLEXIBLE BRONCHOSCOPY N/A 01/13/2016   Procedure: FLEXIBLE BRONCHOSCOPY;  Surgeon: Vilinda Boehringer, MD;  Location: [facility] ORS;  Service: Cardiopulmonary;  Laterality: N/A;  . NEUROPLASTY MAJOR NERVE    . POLYPECTOMY    . THROAT SURGERY     Family History  Problem Relation Age of Onset  . Cancer Mother     Esophageal Ca  . Asthma Brother   . Breast cancer Neg Hx    Social History  Substance Use Topics  . Smoking status: Former Smoker    Types: E-cigarettes    Quit date: 09/19/2015  . Smokeless tobacco: Never Used  . Alcohol use No     Comment: 03/31/2012 sobierty    OB History    No data available     Review of Systems  Constitutional: Negative for appetite change, chills, fatigue, fever and unexpected weight change.  HENT: Negative for congestion and sore throat.   Eyes: Negative for visual disturbance.  Respiratory: Negative for cough, chest tightness, shortness of breath and wheezing.   Cardiovascular: Negative for chest pain.  Gastrointestinal: Positive for abdominal pain and diarrhea. Negative for blood in stool, constipation, nausea and vomiting.  Genitourinary: Negative for difficulty urinating, dysuria, flank pain, hematuria and vaginal discharge.  Musculoskeletal: Negative for back pain and neck pain.  Skin: Negative for rash and wound.  Neurological: Negative for dizziness, syncope, weakness, light-headedness, numbness and headaches.  Hematological: Negative for adenopathy.  Psychiatric/Behavioral: Positive for dysphoric mood. The patient  is nervous/anxious.     Allergies  Codeine; Haldol [haloperidol]; Haloperidol lactate; and Trileptal [oxcarbazepine]  Home Medications   Prior to Admission medications   Medication Sig Start Date End Date Taking? Authorizing Provider  ARIPiprazole (ABILIFY) 10 MG tablet Take 10 mg by mouth daily.   Yes Historical Provider, MD  beclomethasone (QVAR) 40 MCG/ACT inhaler Inhale 1 puff into the lungs 2 (two) times  daily. 05/18/16  Yes Vishal Mungal, MD  buPROPion (WELLBUTRIN XL) 150 MG 24 hr tablet Take 150 mg by mouth daily.   Yes Historical Provider, MD  esomeprazole (NEXIUM) 40 MG capsule Take 1 capsule (40 mg total) by mouth daily. 09/17/16  Yes Lucilla Lame, MD  gabapentin (NEURONTIN) 300 MG capsule Take 300 mg by mouth 2 (two) times daily.   Yes Historical Provider, MD  PROAIR HFA 108 (90 Base) MCG/ACT inhaler INHALE 1-2 PUFFS INTO THE LUNGS EVERY SIX HOURS AS NEEDED FOR WHEEZING OR SHORTNESS OF BREATH 10/05/16  Yes Roselee Nova, MD  Tiotropium Bromide-Olodaterol (STIOLTO RESPIMAT) 2.5-2.5 MCG/ACT AERS Inhale 2 puffs into the lungs daily. 09/22/16  Yes Laverle Hobby, MD  traZODone (DESYREL) 50 MG tablet Take 50-150 mg by mouth at bedtime.    Yes Historical Provider, MD  umeclidinium-vilanterol (ANORO ELLIPTA) 62.5-25 MCG/INH AEPB Inhale 1 puff into the lungs daily. 09/21/16  Yes Laverle Hobby, MD  VESICARE 10 MG tablet TAKE 1 TABLET BY MOUTH DAILY 08/02/16  Yes Roselee Nova, MD  ciprofloxacin (CIPRO) 500 MG tablet Take 1 tablet (500 mg total) by mouth 2 (two) times daily. 10/31/16 11/07/16  Katy Apo, NP   Meds Ordered and Administered this Visit  Medications - No data to display  BP (!) 147/81 (BP Location: Left Arm)   Pulse 91   Temp 98.3 F (36.8 C) (Oral)   Resp 16   Ht 5\' 3"  (1.6 m)   Wt 210 lb (95.3 kg)   SpO2 98%   BMI 37.20 kg/m  No data found.   Physical Exam  Constitutional: She is oriented to person, place, and time. She appears well-developed and well-nourished. She does not appear ill. No distress.  HENT:  Head: Normocephalic and atraumatic.  Mouth/Throat: Oropharynx is clear and moist.  Eyes: EOM are normal.  Neck: Normal range of motion. Neck supple.  Cardiovascular: Normal rate, regular rhythm and normal heart sounds.   No murmur heard. Pulmonary/Chest: Effort normal and breath sounds normal. No respiratory distress.  Abdominal: Soft. Normal  appearance and bowel sounds are normal. She exhibits no distension and no mass. There is no hepatosplenomegaly. There is generalized tenderness and tenderness in the right upper quadrant. There is no rigidity, no rebound, no guarding and no CVA tenderness.  Musculoskeletal: Normal range of motion.  Lymphadenopathy:    She has no cervical adenopathy.  Neurological: She is alert and oriented to person, place, and time.  Skin: Skin is warm and dry. No rash noted.  Psychiatric: Her speech is normal. Judgment and thought content normal. She is slowed. Cognition and memory are normal. She exhibits a depressed mood.    Urgent Care Course     Procedures (including critical care time)  Labs Review Labs Reviewed - No data to display  Imaging Review No results found.   Visual Acuity Review  Right Eye Distance:   Left Eye Distance:   Bilateral Distance:    Right Eye Near:   Left Eye Near:    Bilateral Near:  MDM   1. Diarrhea, unspecified type    Discussed obtaining stool specimen for further evaluation. Patient uncertain if she would be able to produce a specimen before discharge. Wrote discharge orders for Gastrointestinal panel by PCR. May start Imodium as directed. May also start Cipro 500mg  twice a day as directed but discussed that this medication may be stopped pending stool culture results. Encouraged patient to follow-up with her PCP or Gastroenterologist (Dr. Allen Norris) if symptoms persist.      Katy Apo, NP 10/31/16 2312

## 2016-10-31 NOTE — Discharge Instructions (Signed)
Recommend start Cipro 500mg  twice a day as directed. May use Imodium as needed for symptoms. Follow-up pending stool culture results and with your primary care provider or Gastroenterologist (Dr. Roselyn Reef) if symptoms persist.

## 2016-11-01 LAB — GASTROINTESTINAL PANEL BY PCR, STOOL (REPLACES STOOL CULTURE)
Adenovirus F40/41: NOT DETECTED
Astrovirus: NOT DETECTED
CRYPTOSPORIDIUM: NOT DETECTED
CYCLOSPORA CAYETANENSIS: NOT DETECTED
Campylobacter species: NOT DETECTED
ENTAMOEBA HISTOLYTICA: NOT DETECTED
ENTEROAGGREGATIVE E COLI (EAEC): DETECTED — AB
Enteropathogenic E coli (EPEC): NOT DETECTED
Enterotoxigenic E coli (ETEC): NOT DETECTED
GIARDIA LAMBLIA: NOT DETECTED
Norovirus GI/GII: NOT DETECTED
Plesimonas shigelloides: NOT DETECTED
Rotavirus A: NOT DETECTED
SALMONELLA SPECIES: NOT DETECTED
SAPOVIRUS (I, II, IV, AND V): NOT DETECTED
SHIGA LIKE TOXIN PRODUCING E COLI (STEC): NOT DETECTED
SHIGELLA/ENTEROINVASIVE E COLI (EIEC): NOT DETECTED
Vibrio cholerae: NOT DETECTED
Vibrio species: NOT DETECTED
YERSINIA ENTEROCOLITICA: NOT DETECTED

## 2016-11-02 ENCOUNTER — Other Ambulatory Visit: Payer: Self-pay | Admitting: Family Medicine

## 2016-11-02 DIAGNOSIS — N3281 Overactive bladder: Secondary | ICD-10-CM

## 2016-11-04 ENCOUNTER — Telehealth: Payer: Self-pay | Admitting: Family Medicine

## 2016-11-04 ENCOUNTER — Other Ambulatory Visit: Payer: Self-pay | Admitting: Emergency Medicine

## 2016-11-04 DIAGNOSIS — N3281 Overactive bladder: Secondary | ICD-10-CM

## 2016-11-04 MED ORDER — SOLIFENACIN SUCCINATE 10 MG PO TABS: 10.0000 mg | ORAL_TABLET | Freq: Every day | ORAL | 0 refills | Status: DC

## 2016-11-04 NOTE — Telephone Encounter (Signed)
30 day supply sen to [place]

## 2016-11-04 NOTE — Telephone Encounter (Signed)
Pt is requesting a refill on her Vesicare to be sent to [facility]. Pt has an appt on 11/18/16 for a CPE

## 2016-11-09 ENCOUNTER — Other Ambulatory Visit: Payer: Self-pay | Admitting: Family Medicine

## 2016-11-09 DIAGNOSIS — K219 Gastro-esophageal reflux disease without esophagitis: Secondary | ICD-10-CM

## 2016-11-15 ENCOUNTER — Other Ambulatory Visit: Payer: Self-pay | Admitting: Family Medicine

## 2016-11-15 DIAGNOSIS — K219 Gastro-esophageal reflux disease without esophagitis: Secondary | ICD-10-CM

## 2016-11-18 ENCOUNTER — Other Ambulatory Visit: Payer: Self-pay | Admitting: Family Medicine

## 2016-11-18 ENCOUNTER — Ambulatory Visit (INDEPENDENT_AMBULATORY_CARE_PROVIDER_SITE_OTHER): Payer: Medicaid Other | Admitting: Family Medicine

## 2016-11-18 ENCOUNTER — Encounter: Payer: Self-pay | Admitting: Family Medicine

## 2016-11-18 VITALS — BP 124/78 | HR 100 | Temp 98.4°F | Resp 16 | Ht 63.0 in | Wt 212.3 lb

## 2016-11-18 DIAGNOSIS — Z0000 Encounter for general adult medical examination without abnormal findings: Secondary | ICD-10-CM | POA: Diagnosis not present

## 2016-11-18 DIAGNOSIS — Z01419 Encounter for gynecological examination (general) (routine) without abnormal findings: Secondary | ICD-10-CM

## 2016-11-18 DIAGNOSIS — Z780 Asymptomatic menopausal state: Secondary | ICD-10-CM | POA: Diagnosis not present

## 2016-11-18 DIAGNOSIS — E6609 Other obesity due to excess calories: Secondary | ICD-10-CM

## 2016-11-18 DIAGNOSIS — Z6837 Body mass index (BMI) 37.0-37.9, adult: Secondary | ICD-10-CM

## 2016-11-18 DIAGNOSIS — Z1239 Encounter for other screening for malignant neoplasm of breast: Secondary | ICD-10-CM

## 2016-11-18 DIAGNOSIS — Z1231 Encounter for screening mammogram for malignant neoplasm of breast: Secondary | ICD-10-CM

## 2016-11-18 NOTE — Progress Notes (Signed)
Name: Kathryn Ramirez   MRN: [id number]    DOB: January 04, 1951   Date:11/18/2016       Progress Note  Subjective  Chief Complaint  Chief Complaint  Patient presents with  . Annual Exam    HPI  Pt. Presents for Complete Physical Exam. She is due for Pap Smear Mammogram, and DEXA scan today  Had colonoscopy and endoscopy in February 2018.  Patient is also concerned about obesity, her current weight is 212 lbs, BMI is over 37 kg/m2, she is trying to lose weight but struggling with sweet cravings, she is not physically active partly because of a bad right knee. She is interested in pharmacotherapy for weight loss.    Past Medical History:  Diagnosis Date  . Acid reflux disease   . Asthma   . Bipolar 1 disorder ([place])   . Liver hemangioma   . Lung nodule   . Psoriasis   . Rheumatoid arthritis ([place])   . Tardive dyskinesia   . Wears dentures    full upper and lower    Past Surgical History:  Procedure Laterality Date  . CATARACT EXTRACTION W/ INTRAOCULAR LENS  IMPLANT, BILATERAL    . CERVICAL SPINE SURGERY  2017   [facility] [place]  . COLONOSCOPY WITH PROPOFOL N/A 09/14/2016   Procedure: COLONOSCOPY WITH PROPOFOL;  Surgeon: Lucilla Lame, MD;  Location: [facility] ENDOSCOPY;  Service: Endoscopy;  Laterality: N/A;  . ESOPHAGOGASTRODUODENOSCOPY (EGD) WITH PROPOFOL N/A 09/14/2016   Procedure: ESOPHAGOGASTRODUODENOSCOPY (EGD) WITH PROPOFOL;  Surgeon: Lucilla Lame, MD;  Location: [facility] ENDOSCOPY;  Service: Endoscopy;  Laterality: N/A;  . FLEXIBLE BRONCHOSCOPY N/A 01/13/2016   Procedure: FLEXIBLE BRONCHOSCOPY;  Surgeon: Vilinda Boehringer, MD;  Location: [facility] ORS;  Service: Cardiopulmonary;  Laterality: N/A;  . NEUROPLASTY MAJOR NERVE    . POLYPECTOMY    . THROAT SURGERY      Family History  Problem Relation Age of Onset  . Cancer Mother     Esophageal Ca  . Asthma Brother   . Breast cancer Neg Hx     Social History   Social History  . Marital status: Single    Spouse name: N/A  .  Number of children: N/A  . Years of education: N/A   Occupational History  . Not on file.   Social History Main Topics  . Smoking status: Former Smoker    Types: E-cigarettes    Quit date: 09/19/2015  . Smokeless tobacco: Never Used  . Alcohol use No     Comment: 03/31/2012 sobierty   . Drug use: No  . Sexual activity: No   Other Topics Concern  . Not on file   Social History Narrative  . No narrative on file     Current Outpatient Prescriptions:  .  ARIPiprazole (ABILIFY) 10 MG tablet, Take 10 mg by mouth daily., Disp: , Rfl:  .  buPROPion (WELLBUTRIN XL) 150 MG 24 hr tablet, Take 150 mg by mouth daily., Disp: , Rfl:  .  esomeprazole (NEXIUM) 40 MG capsule, Take 1 capsule (40 mg total) by mouth daily., Disp: 30 capsule, Rfl: 6 .  gabapentin (NEURONTIN) 300 MG capsule, Take 300 mg by mouth 2 (two) times daily., Disp: , Rfl:  .  PROAIR HFA 108 (90 Base) MCG/ACT inhaler, INHALE 1-2 PUFFS INTO THE LUNGS EVERY SIX HOURS AS NEEDED FOR WHEEZING OR SHORTNESS OF BREATH, Disp: 8.5 g, Rfl: 2 .  solifenacin (VESICARE) 10 MG tablet, Take 1 tablet (10 mg total) by mouth daily., Disp: 30  tablet, Rfl: 0 .  Tiotropium Bromide-Olodaterol (STIOLTO RESPIMAT) 2.5-2.5 MCG/ACT AERS, Inhale 2 puffs into the lungs daily., Disp: 1 Inhaler, Rfl: 5 .  traZODone (DESYREL) 50 MG tablet, Take 50-150 mg by mouth at bedtime. , Disp: , Rfl:  .  umeclidinium-vilanterol (ANORO ELLIPTA) 62.5-25 MCG/INH AEPB, Inhale 1 puff into the lungs daily., Disp: 60 each, Rfl: 4  Allergies  Allergen Reactions  . Codeine Nausea Only  . Haldol [Haloperidol] Other (See Comments)    Reaction: Pt "felt like her neck was going to snap."  . Haloperidol Lactate Other (See Comments)    Sever neck stiffness.  . Trileptal [Oxcarbazepine] Swelling     Review of Systems  Constitutional: Negative for chills, fever and malaise/fatigue.  HENT: Negative for congestion, ear pain, sinus pain and sore throat.   Eyes: Negative for blurred  vision and double vision.  Respiratory: Positive for cough (intermittent cough, has Asthma, being seen by Pulmonary). Negative for sputum production and shortness of breath.   Cardiovascular: Negative for chest pain, palpitations and leg swelling.  Gastrointestinal: Negative for abdominal pain, blood in stool, constipation, diarrhea, nausea and vomiting.  Genitourinary: Negative for dysuria and hematuria.  Musculoskeletal: Negative for back pain and neck pain.  Skin: Negative for rash.  Neurological: Negative for dizziness and headaches.  Psychiatric/Behavioral: Positive for depression. The patient is nervous/anxious. The patient does not have insomnia.       Objective  Vitals:   11/18/16 1338  BP: 124/78  Pulse: 100  Resp: 16  Temp: 98.4 F (36.9 C)  TempSrc: Oral  SpO2: 95%  Weight: 212 lb 4.8 oz (96.3 kg)  Height: 5\' 3"  (1.6 m)    Physical Exam  Constitutional: She is oriented to person, place, and time and well-developed, well-nourished, and in no distress.  HENT:  Head: Normocephalic and atraumatic.  Right Ear: External ear normal.  Mouth/Throat: Oropharynx is clear and moist.  Left ear cerumen impaction.  Neck: Neck supple. No thyroid mass and no thyromegaly present.  Cardiovascular: Normal rate, regular rhythm, S1 normal, S2 normal and normal heart sounds.   No murmur heard. Pulmonary/Chest: Effort normal and breath sounds normal. She has no wheezes. She has no rhonchi.  Abdominal: Soft. Bowel sounds are normal. There is no tenderness.  Neurological: She is alert and oriented to person, place, and time.  Psychiatric: Mood, memory, affect and judgment normal.  Nursing note and vitals reviewed.   Assessment & Plan  1. Well woman exam with routine gynecological exam Obtain age-appropriate screening lab work - COMPLETE METABOLIC PANEL WITH GFR - Lipid panel - CBC with Differential/Platelet - VITAMIN D 25 Hydroxy (Vit-D Deficiency, Fractures) - TSH - Pap IG,  CT/NG NAA, and HPV (high risk)  2. Post-menopausal  - DG Bone Density; Future  3. Breast cancer screening  - MM Digital Screening; Future  4. Class 2 obesity due to excess calories without serious comorbidity with body mass index (BMI) of 37.0 to 37.9 in adult Discussed multiple pharmacotherapeutic options, I think the best treatment option in her case is naltrexone-bupropion, patient was wise to check with her psychiatrist if he can take her off the bupropion for treatment of depression and replaced with a different agent. Patient verbalized agreement and will schedule an appointment.   Alexzander Dolinger [facility] 11/18/2016 1:44 PM

## 2016-11-19 ENCOUNTER — Telehealth: Payer: Self-pay

## 2016-11-19 NOTE — Telephone Encounter (Signed)
I cotnacted the [facility] to speak to her psychiatrist Dr. Leonides Schanz, but he was not in so a voicemail was left with her assistant Santiago Glad explaining the situation and how we are needing clarity on how they would like to handle this situation.

## 2016-11-19 NOTE — Telephone Encounter (Signed)
Management to patient that she should talk to her psychiatrist about switching bupropion to a different antidepressant, we are in the process of starting her on Contrave which contains bupropion. She should follow up with psychiatry to determine if a different antidepressant will be more appropriate.

## 2016-11-19 NOTE — Telephone Encounter (Signed)
Patient called saying that the pyschiatrists at [facility] needs a fax stating it is ok to stop the Bupropion sent to their office.  Patient stated that she has to stop this medication due to her about to start the Contrave that already has this medication in it.  Please advise if it is ok to write a letter.

## 2016-11-22 ENCOUNTER — Other Ambulatory Visit: Payer: Self-pay | Admitting: Family Medicine

## 2016-11-22 ENCOUNTER — Telehealth: Payer: Self-pay | Admitting: Family Medicine

## 2016-11-22 DIAGNOSIS — K219 Gastro-esophageal reflux disease without esophagitis: Secondary | ICD-10-CM

## 2016-11-22 LAB — PAP IG, CT-NG NAA, HPV HIGH-RISK
CHLAMYDIA PROBE AMP: NOT DETECTED
GC PROBE AMP: NOT DETECTED
HPV DNA High Risk: NOT DETECTED

## 2016-11-22 IMAGING — CR DG CHEST 2V
2 series · 2 of 2 positions shown · non-contrast
Comparison: None.

CLINICAL DATA: Fever and headache for 3 weeks. History of
hypertension. Former smoker.

EXAM:
CHEST  2 VIEW

[chest pa]
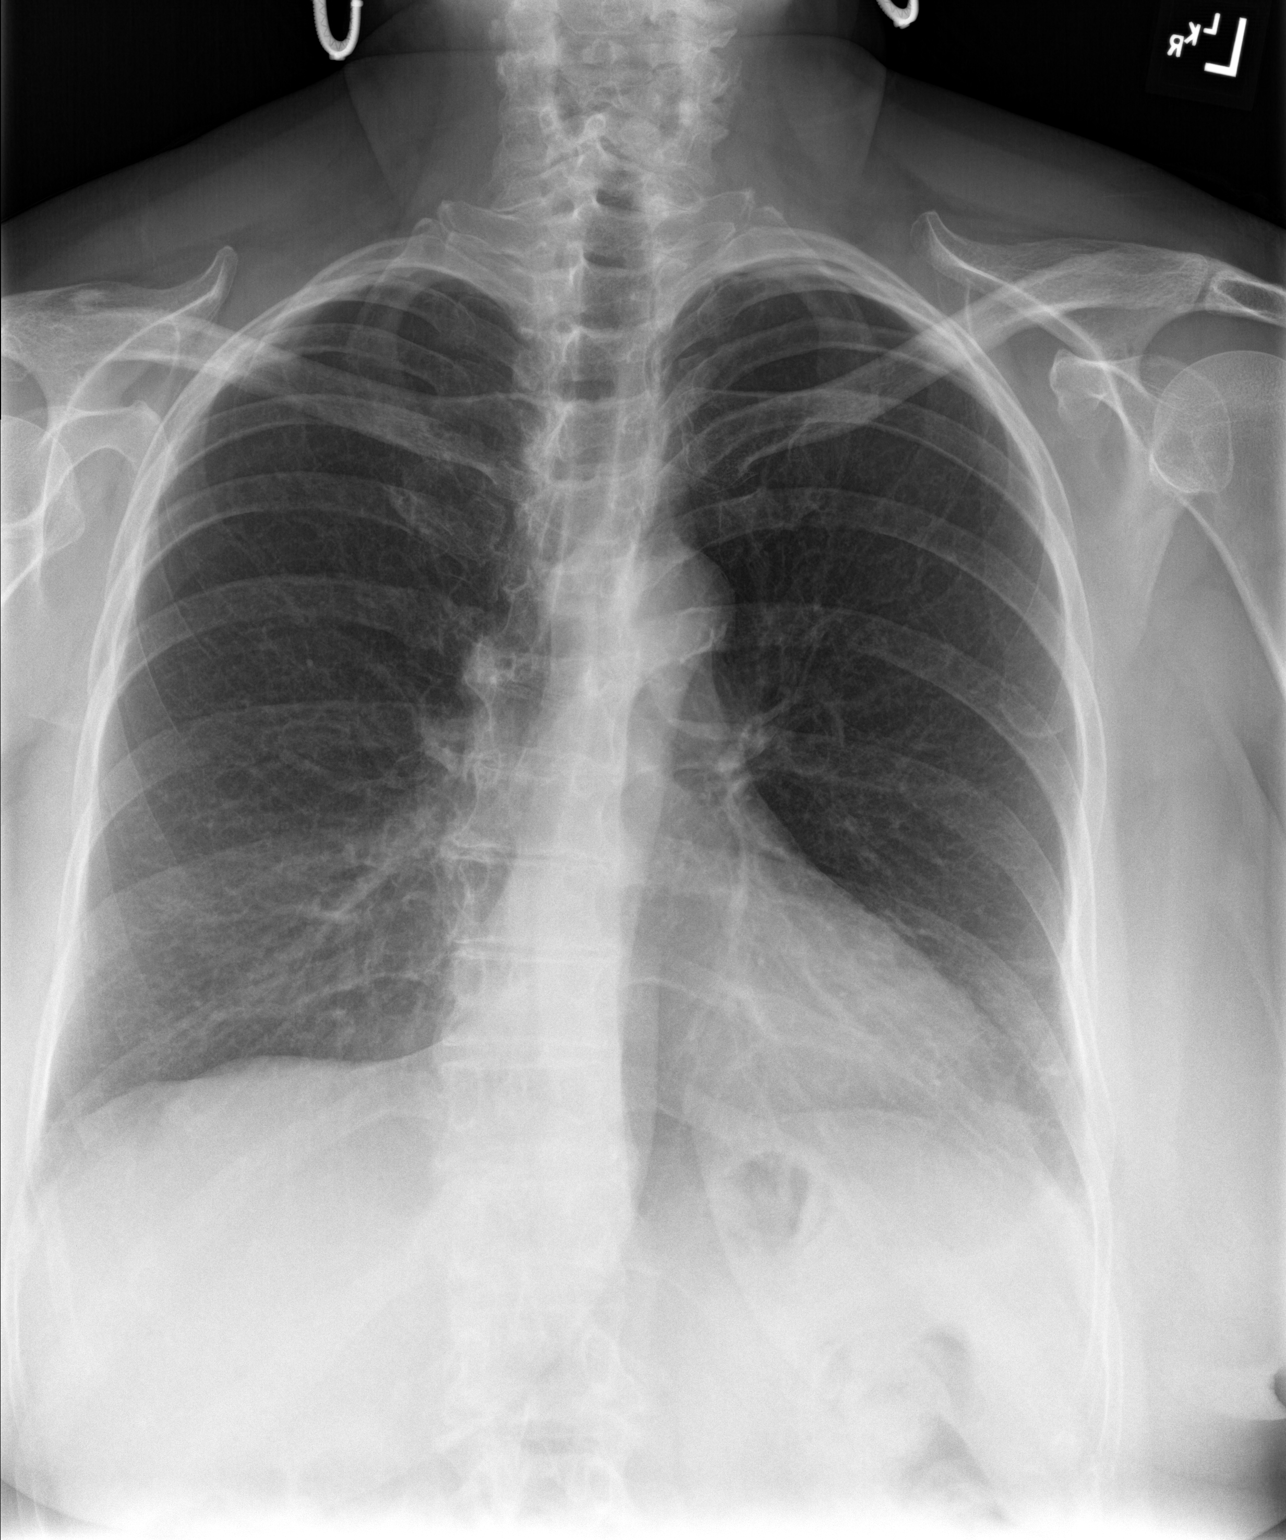

[chest lat]
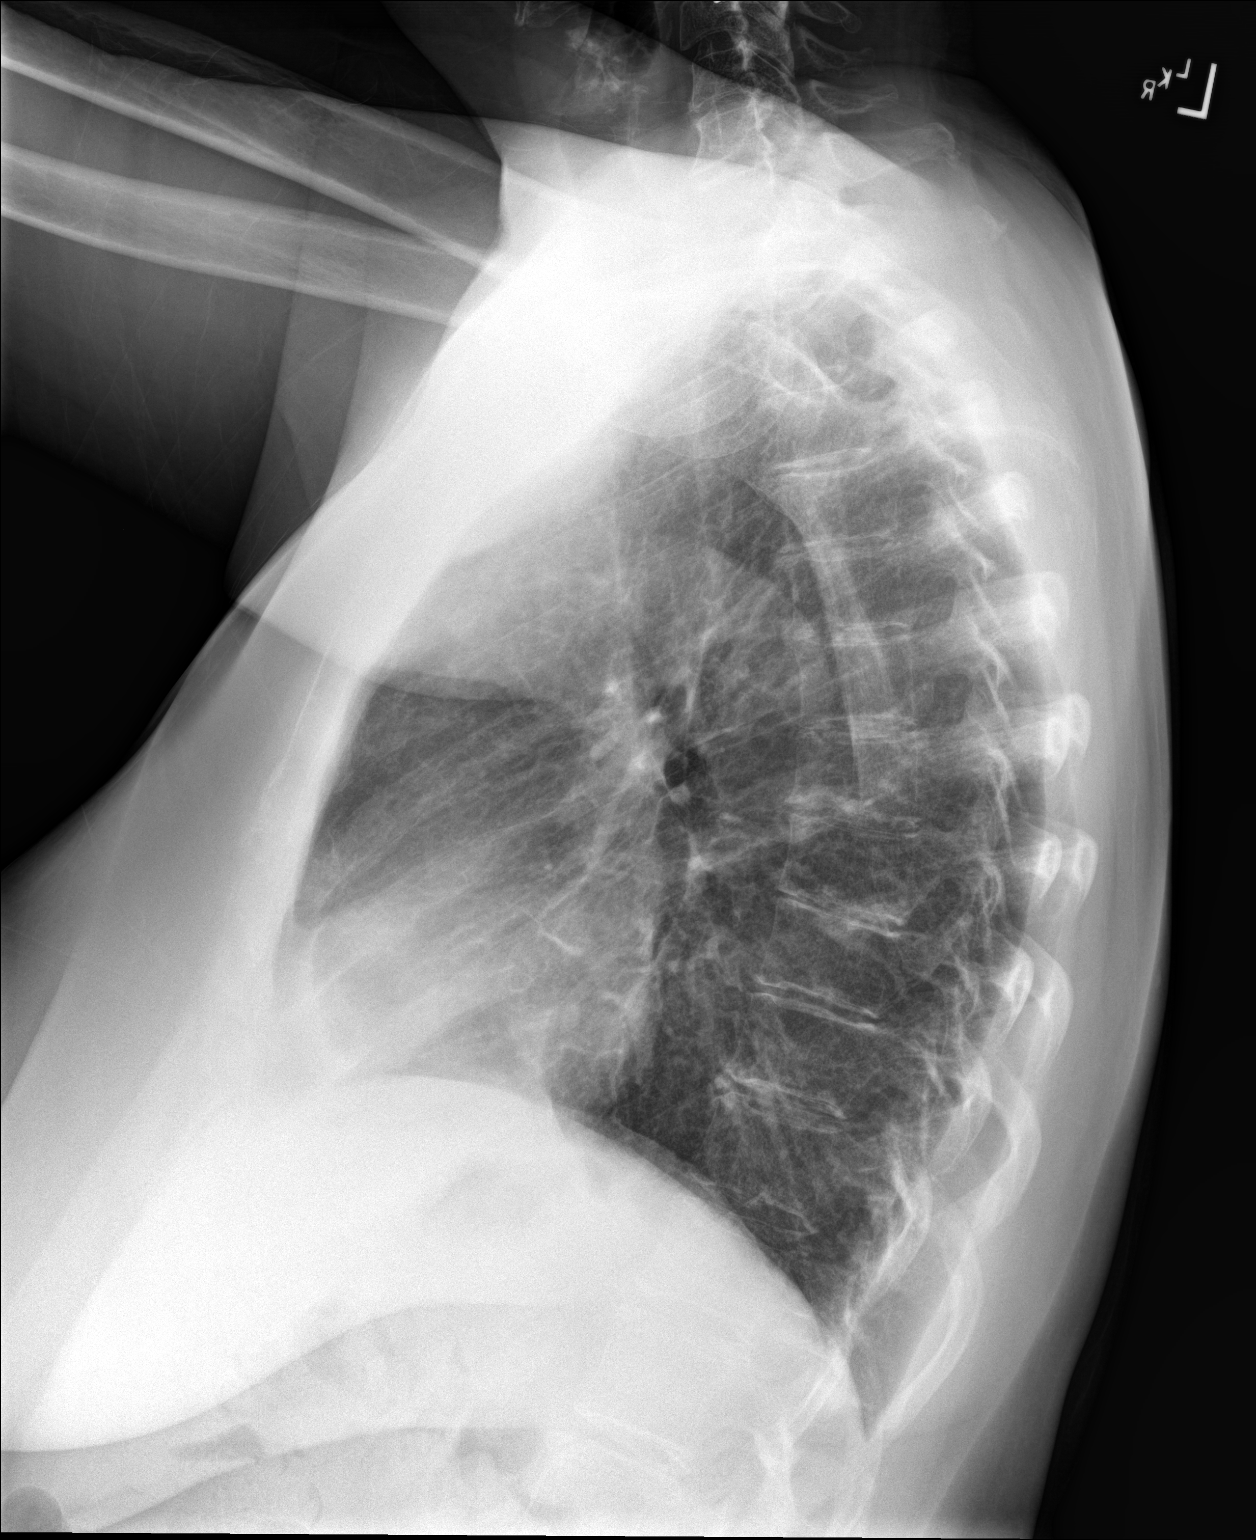

[2 of 2 positions shown; findings below may reference images not displayed]

FINDINGS: Normal cardiac silhouette and mediastinal contours. Atherosclerotic
plaque within thoracic aorta. Minimal left basilar heterogeneous
opacities. No pleural effusion or pneumothorax. No evidence of. No
acute osseous abnormalities.
IMPRESSION: Left basilar opacities - while possibly representative of
atelectasis or scar, in the absence of prior examinations, could be
indicative of developing airspace opacity / pneumonia. A follow-up
chest radiograph in 3 to 4 weeks after treatment is recommended to
ensure resolution.

## 2016-11-22 NOTE — Telephone Encounter (Signed)
Pt is needing refill on nexium. Pharm is [facility]

## 2016-11-23 ENCOUNTER — Other Ambulatory Visit: Payer: Self-pay | Admitting: Family Medicine

## 2016-11-23 ENCOUNTER — Telehealth: Payer: Self-pay | Admitting: Gastroenterology

## 2016-11-23 ENCOUNTER — Other Ambulatory Visit: Payer: Self-pay

## 2016-11-23 DIAGNOSIS — K219 Gastro-esophageal reflux disease without esophagitis: Secondary | ICD-10-CM

## 2016-11-23 MED ORDER — ESOMEPRAZOLE MAGNESIUM 40 MG PO CPDR
40.0000 mg | DELAYED_RELEASE_CAPSULE | Freq: Every day | ORAL | 6 refills | Status: DC
Start: 2016-11-23 — End: 2017-06-08

## 2016-11-23 NOTE — Telephone Encounter (Signed)
Rx sent to pt's pharmacy per her request.  

## 2016-11-23 NOTE — Telephone Encounter (Signed)
*  STAT* If patient is at the pharmacy, call can be transferred to refill team.   1. Which medications need to be refilled? (please list name of each medication and dose if known)esomeprazole (NEXIUM) 40 MG capsule  2. Which pharmacy/location (including street and city if local pharmacy) is medication to be sent to? [address]on  3. Do they need a 30 day or 90 day supply? 90 day

## 2016-11-23 NOTE — Telephone Encounter (Signed)
Script sent  

## 2016-11-29 NOTE — Progress Notes (Signed)
* [facility]     Assessment and Plan:  Asthma. --Control has improved with stiolto, but still not well controlled, using rescue twice daily. Will restart qvar 80 1 puff bid in addition to stiolto.   Cough. --Appears adequately controlled at this time.   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   GERD. -With significant symptoms including nausea, has been referred to gastroenterology and is planned for endoscopy and colonoscopy later this month.   Lung nodules.  --Per history she was followed at [facility] and told that she had lung nodules. Most recent CT chest 11/2015 showed no evidence of nodules. No need for further workup.    Date: 11/29/2016  MRN# [id number] Kathryn Ramirez [birth year]   Kathryn Ramirez is a 66 y.o. old female seen in follow up for chief complaint of  Chief Complaint  Patient presents with  . Asthma    Pt using Anoro. Pt was switched from Qvar and is now able to use rescue inhaler less.     HPI:   Patient is a pleasant 66 year old female past medical history of bipolar, tardive dyskinesia, rheumatoid arthritis, hypertension, multiple pulmonary nodules, asthma, acid reflux disease, obesity, seen in consultation for pulmonary nodules. She has a history of vocal cord polyps, followed by [facility] ENT. She is a former smoker quit in August 2016, previously smoked one pack per day for 30 years. She previously worked as a Psychologist, sport and exercise, has since retired.It was noted that she had a history of vocal cord polyps, previously followed by [facility]. She was therefore referred back to ENT for further management.    At last visit, she was having poorly controlled asthma while on Qvar. Therefore Qvar was discontinued. She was initially started on Anoro, however, this was not covered, therefore switched to [employer] inhaler. She is still using albuterol twice whereas she was using it 4 times per day on the qvar. She has noticed that her  hoarseness is better since being off of the qvar.  She has never had allergy testing, she takes no antihistamines.   She visits her daughter twice per week who has a dog and a cat. She does have symptoms of reflux despite taking nexium and follow with GI. Her last meal is at 6 pm.    PFT on 12/11/15; Slightly reduced FEV1, normal FVC, ratio, RV, slightly increased RV/TLC, c/w mild air trapping. Normal DLCO, normal flow volume loops.    Hx of Right Vocal cord polyp removal at [facility] [place], [place], 2013. Now with Right VC scarring. Followed previously by [facility]NT. Had a bronchoscopy 01/13/16 due to chronic cough - negative BAL  Medication:   Outpatient Encounter Prescriptions as of 12/01/2016  Medication Sig  . ARIPiprazole (ABILIFY) 10 MG tablet Take 10 mg by mouth daily.  [name] buPROPion (WELLBUTRIN XL) 150 MG 24 hr tablet Take 150 mg by mouth daily.  [name] esomeprazole (NEXIUM) 40 MG capsule Take 1 capsule (40 mg total) by mouth daily.  [name] gabapentin (NEURONTIN) 300 MG capsule Take 300 mg by mouth 2 (two) times daily.  [name] PROAIR HFA 108 (90 Base) MCG/ACT inhaler INHALE 1-2 PUFFS INTO THE LUNGS EVERY SIX HOURS AS NEEDED FOR WHEEZING OR SHORTNESS OF BREATH  . solifenacin (VESICARE) 10 MG tablet Take 1 tablet (10 mg total) by mouth daily.  . Tiotropium Bromide-Olodaterol (STIOLTO RESPIMAT) 2.5-2.5 MCG/ACT AERS Inhale 2 puffs into the lungs daily.  . traZODone (DESYREL) 50 MG tablet Take 50-150 mg  by mouth at bedtime.   [name] umeclidinium-vilanterol (ANORO ELLIPTA) 62.5-25 MCG/INH AEPB Inhale 1 puff into the lungs daily.   No facility-administered encounter medications on file as of 12/01/2016.      Allergies:  Codeine; Haldol [haloperidol]; Haloperidol lactate; and Trileptal [oxcarbazepine]  Review of Systems: Gen:  Denies  fever, sweats. HEENT: Denies blurred vision. Cvc:  No dizziness, chest pain or heaviness Resp:   Denies cough or sputum porduction. Gi: Denies swallowing difficulty,  stomach pain. constipation, bowel incontinence Gu:  Denies bladder incontinence, burning urine Ext:   No Joint pain, stiffness. Skin: No skin rash, easy bruising. Endoc:  No polyuria, polydipsia. Psych: No depression, insomnia. Other:  All other systems were reviewed and found to be negative other than what is mentioned in the HPI.   Physical Examination:   VS: BP 138/86 (BP Location: Right Arm, Cuff Size: Normal)   Pulse 93   Ht 5\' 3"  (1.6 m)   SpO2 93%   General Appearance: No distress  Neuro:without focal findings,  speech normal,  HEENT: PERRLA, EOM intact. Pulmonary: normal breath sounds, No wheezing.   CardiovascularNormal S1,S2.  No m/r/g.   Abdomen: Benign, Soft, non-tender. Renal:  No costovertebral tenderness  GU:  Not performed at this time. Endoc: No evident thyromegaly, no signs of acromegaly. Skin:   warm, no rash. Extremities: normal, no cyanosis, clubbing.   LABORATORY PANEL:   CBC No results for input(s): WBC, HGB, HCT, PLT in the last 168 hours. ------------------------------------------------------------------------------------------------------------------  Chemistries  No results for input(s): NA, K, CL, CO2, GLUCOSE, BUN, CREATININE, CALCIUM, MG, AST, ALT, ALKPHOS, BILITOT in the last 168 hours.  Invalid input(s): GFRCGP ------------------------------------------------------------------------------------------------------------------  Cardiac Enzymes No results for input(s): TROPONINI in the last 168 hours. ------------------------------------------------------------  RADIOLOGY:   No results found for this or any previous visit. Results for orders placed during the hospital encounter of 08/15/16  DG Chest 2 View   Narrative CLINICAL DATA:  Cough and shortness of breath, 3 days duration. History of lung nodules.  EXAM: CHEST  2 VIEW  COMPARISON:  CT 12/10/2015.  Radiography 09/01/2015.  FINDINGS: Heart size is normal. There is aortic  atherosclerosis. Mild pleural and parenchymal scarring in the lower lungs. No sign of active infiltrate, mass, effusion or collapse. Ordinary degenerative changes affect the spine. Previous cervical ACDF.  IMPRESSION: No active disease. Pleural and parenchymal scarring in the lower chest.   Electronically Signed   By: Nelson Chimes M.D.   On: 08/15/2016 15:20    ------------------------------------------------------------------------------------------------------------------  Thank  you for allowing [facility] to assist in the care of your patient. Our recommendations are noted above.  Please contact us if we can be of further service.   Marda Stalker, MD.  [facility] Office Number: [phone]  Patricia Pesa, M.D.  Vilinda Boehringer, M.D.  Merton Border, M.D  11/29/2016

## 2016-12-01 ENCOUNTER — Ambulatory Visit
Admission: RE | Admit: 2016-12-01 | Discharge: 2016-12-01 | Disposition: A | Discharge status: Home / Self Care | Payer: Medicaid Other | Source: Ambulatory Visit | Attending: Internal Medicine | Admitting: Internal Medicine

## 2016-12-01 ENCOUNTER — Other Ambulatory Visit
Admission: RE | Admit: 2016-12-01 | Discharge: 2016-12-01 | Disposition: A | Discharge status: Home / Self Care | Payer: Medicaid Other | Source: Ambulatory Visit | Attending: Internal Medicine | Admitting: Internal Medicine

## 2016-12-01 ENCOUNTER — Encounter: Payer: Self-pay | Admitting: Internal Medicine

## 2016-12-01 ENCOUNTER — Ambulatory Visit (INDEPENDENT_AMBULATORY_CARE_PROVIDER_SITE_OTHER): Payer: Medicaid Other | Admitting: Internal Medicine

## 2016-12-01 VITALS — BP 138/86 | HR 93 | Ht 63.0 in

## 2016-12-01 DIAGNOSIS — J4530 Mild persistent asthma, uncomplicated: Secondary | ICD-10-CM | POA: Diagnosis not present

## 2016-12-01 DIAGNOSIS — R0600 Dyspnea, unspecified: Secondary | ICD-10-CM | POA: Diagnosis present

## 2016-12-01 DIAGNOSIS — I700 Atherosclerosis of aorta: Secondary | ICD-10-CM | POA: Insufficient documentation

## 2016-12-01 LAB — CBC WITH DIFFERENTIAL/PLATELET
Basophils Absolute: 0 10*3/uL (ref 0–0.1)
Basophils Relative: 0 %
EOS PCT: 0 %
Eosinophils Absolute: 0 10*3/uL (ref 0–0.7)
HCT: 36.8 % (ref 35.0–47.0)
Hemoglobin: 12 g/dL (ref 12.0–16.0)
LYMPHS ABS: 1.8 10*3/uL (ref 1.0–3.6)
LYMPHS PCT: 11 %
MCH: 26 pg (ref 26.0–34.0)
MCHC: 32.5 g/dL (ref 32.0–36.0)
MCV: 80 fL (ref 80.0–100.0)
MONO ABS: 0.8 10*3/uL (ref 0.2–0.9)
MONOS PCT: 5 %
Neutro Abs: 13.9 10*3/uL — ABNORMAL HIGH (ref 1.4–6.5)
Neutrophils Relative %: 84 %
PLATELETS: 351 10*3/uL (ref 150–440)
RBC: 4.6 MIL/uL (ref 3.80–5.20)
RDW: 15.9 % — AB (ref 11.5–14.5)
WBC: 16.5 10*3/uL — ABNORMAL HIGH (ref 3.6–11.0)

## 2016-12-01 MED ORDER — BECLOMETHASONE DIPROPIONATE 80 MCG/ACT IN AERS: 1.0000 | INHALATION_SPRAY | Freq: Two times a day (BID) | RESPIRATORY_TRACT | 6 refills | Status: DC

## 2016-12-01 NOTE — Addendum Note (Signed)
Addended by: Devona Konig on: 12/01/2016 11:14 AM   Modules accepted: Orders

## 2016-12-01 NOTE — Addendum Note (Signed)
Addended by: Devona Konig on: 12/01/2016 11:36 AM   Modules accepted: Orders

## 2016-12-01 NOTE — Patient Instructions (Addendum)
--  Will check RAST, CBC with differential.  --Will check CXR.   --Restart Qvar 80 1 puff twice daily in addition to all other inhalers.

## 2016-12-04 LAB — MISC LABCORP TEST (SEND OUT): LABCORP TEST CODE: 602628

## 2016-12-06 ENCOUNTER — Other Ambulatory Visit: Payer: Self-pay | Admitting: Family Medicine

## 2016-12-06 DIAGNOSIS — N3281 Overactive bladder: Secondary | ICD-10-CM

## 2016-12-07 ENCOUNTER — Telehealth: Payer: Self-pay | Admitting: Internal Medicine

## 2016-12-07 NOTE — Telephone Encounter (Signed)
Pt would like chest xray results

## 2016-12-07 NOTE — Telephone Encounter (Signed)
Please advise on CXR results.

## 2016-12-08 NOTE — Telephone Encounter (Signed)
Pt informed. Nothing further needed. 

## 2016-12-08 NOTE — Telephone Encounter (Signed)
Looks ok, no changes from previous.

## 2016-12-27 ENCOUNTER — Encounter: Payer: Self-pay | Admitting: Family Medicine

## 2016-12-27 ENCOUNTER — Ambulatory Visit (INDEPENDENT_AMBULATORY_CARE_PROVIDER_SITE_OTHER): Payer: Medicaid Other | Admitting: Family Medicine

## 2016-12-27 ENCOUNTER — Other Ambulatory Visit: Payer: Self-pay | Admitting: Family Medicine

## 2016-12-27 VITALS — BP 140/78 | HR 99 | Temp 97.8°F | Resp 17 | Ht 63.0 in | Wt 206.7 lb

## 2016-12-27 DIAGNOSIS — M1711 Unilateral primary osteoarthritis, right knee: Secondary | ICD-10-CM

## 2016-12-27 DIAGNOSIS — M7121 Synovial cyst of popliteal space [Baker], right knee: Secondary | ICD-10-CM | POA: Diagnosis not present

## 2016-12-27 LAB — CBC WITH DIFFERENTIAL/PLATELET
BASOS ABS: 76 {cells}/uL (ref 0–200)
Basophils Relative: 1 %
Eosinophils Absolute: 228 cells/uL (ref 15–500)
Eosinophils Relative: 3 %
HCT: 38.4 % (ref 35.0–45.0)
Hemoglobin: 12.1 g/dL (ref 11.7–15.5)
LYMPHS PCT: 34 %
Lymphs Abs: 2584 cells/uL (ref 850–3900)
MCH: 25.7 pg — AB (ref 27.0–33.0)
MCHC: 31.5 g/dL — AB (ref 32.0–36.0)
MCV: 81.5 fL (ref 80.0–100.0)
MONOS PCT: 7 %
MPV: 9.2 fL (ref 7.5–12.5)
Monocytes Absolute: 532 cells/uL (ref 200–950)
NEUTROS PCT: 55 %
Neutro Abs: 4180 cells/uL (ref 1500–7800)
PLATELETS: 353 10*3/uL (ref 140–400)
RBC: 4.71 MIL/uL (ref 3.80–5.10)
RDW: 15 % (ref 11.0–15.0)
WBC: 7.6 10*3/uL (ref 3.8–10.8)

## 2016-12-27 LAB — CMP 10231
AG RATIO: 1.2 ratio (ref 1.0–2.5)
ALK PHOS: 73 U/L (ref 33–130)
ALT: 14 U/L (ref 6–29)
AST: 16 U/L (ref 10–35)
Albumin: 3.6 g/dL (ref 3.6–5.1)
BILIRUBIN TOTAL: 0.2 mg/dL (ref 0.2–1.2)
BUN/Creatinine Ratio: 12 Ratio (ref 6–22)
BUN: 9 mg/dL (ref 7–25)
CO2: 28 mmol/L (ref 20–31)
Calcium: 9.2 mg/dL (ref 8.6–10.4)
Chloride: 103 mmol/L (ref 98–110)
Creat: 0.75 mg/dL (ref 0.50–0.99)
GFR, EST NON AFRICAN AMERICAN: 85 mL/min (ref >=60)
GFR, Est African American: >89 mL/min (ref >=60)
GLOBULIN: 3.3 g/dL (ref 1.9–3.7)
GLUCOSE: 102 mg/dL — AB (ref 65–99)
Potassium: 4.8 mmol/L (ref 3.5–5.3)
Sodium: 140 mmol/L (ref 135–146)
TOTAL PROTEIN: 6.9 g/dL (ref 6.1–8.1)

## 2016-12-27 LAB — LIPID PANEL
CHOL/HDL RATIO: 4.6 ratio (ref <5.0)
Cholesterol: 194 mg/dL (ref <200)
HDL: 42 mg/dL — ABNORMAL LOW (ref >50)
LDL CALC: 119 mg/dL — AB (ref <100)
Triglycerides: 164 mg/dL — ABNORMAL HIGH (ref <150)
VLDL: 33 mg/dL — ABNORMAL HIGH (ref <30)

## 2016-12-27 LAB — TSH: TSH: 1.5 mIU/L

## 2016-12-27 NOTE — Progress Notes (Signed)
Name: Kathryn Ramirez   MRN: [id number]    DOB: 1950-07-31   Date:12/27/2016       Progress Note  Subjective  Chief Complaint  Chief Complaint  Patient presents with  . Follow-up    second opinion (knee)    HPI  Patient is here to discuss her proposed right total knee replacement by Dr. Sabra Heck (orthopedics). She has degenerative joint disease in the right knee, last X ray in December 2016 showed tricompartmental joint damage and she was subsequently referred to Dr. Sabra Heck. She has received knee injections several times and feel like they are not as effective as they once were. She is considering knee replacement but reports her insurance is unlikely to cover the knee replacement at this time.    Past Medical History:  Diagnosis Date  . Acid reflux disease   . Asthma   . Bipolar 1 disorder ([place])   . Liver hemangioma   . Lung nodule   . Psoriasis   . Rheumatoid arthritis ([place])   . Tardive dyskinesia   . Wears dentures    full upper and lower    Past Surgical History:  Procedure Laterality Date  . CATARACT EXTRACTION W/ INTRAOCULAR LENS  IMPLANT, BILATERAL    . CERVICAL SPINE SURGERY  2017   [facility] [place]  . COLONOSCOPY WITH PROPOFOL N/A 09/14/2016   Procedure: COLONOSCOPY WITH PROPOFOL;  Surgeon: Lucilla Lame, MD;  Location: [facility] ENDOSCOPY;  Service: Endoscopy;  Laterality: N/A;  . ESOPHAGOGASTRODUODENOSCOPY (EGD) WITH PROPOFOL N/A 09/14/2016   Procedure: ESOPHAGOGASTRODUODENOSCOPY (EGD) WITH PROPOFOL;  Surgeon: Lucilla Lame, MD;  Location: [facility] ENDOSCOPY;  Service: Endoscopy;  Laterality: N/A;  . FLEXIBLE BRONCHOSCOPY N/A 01/13/2016   Procedure: FLEXIBLE BRONCHOSCOPY;  Surgeon: Vilinda Boehringer, MD;  Location: [facility] ORS;  Service: Cardiopulmonary;  Laterality: N/A;  . NEUROPLASTY MAJOR NERVE    . POLYPECTOMY    . THROAT SURGERY      Family History  Problem Relation Age of Onset  . Cancer Mother        Esophageal Ca  . Asthma Brother   . Breast cancer Neg Hx      Social History   Social History  . Marital status: Single    Spouse name: N/A  . Number of children: N/A  . Years of education: N/A   Occupational History  . Not on file.   Social History Main Topics  . Smoking status: Former Smoker    Types: E-cigarettes    Quit date: 09/19/2015  . Smokeless tobacco: Never Used  . Alcohol use No     Comment: 03/31/2012 sobierty   . Drug use: No  . Sexual activity: No   Other Topics Concern  . Not on file   Social History Narrative  . No narrative on file     Current Outpatient Prescriptions:  .  ARIPiprazole (ABILIFY) 10 MG tablet, Take 10 mg by mouth daily., Disp: , Rfl:  .  beclomethasone (QVAR) 80 MCG/ACT inhaler, Inhale 1 puff into the lungs 2 (two) times daily., Disp: 1 Inhaler, Rfl: 6 .  buPROPion (WELLBUTRIN XL) 150 MG 24 hr tablet, Take 150 mg by mouth daily., Disp: , Rfl:  .  esomeprazole (NEXIUM) 40 MG capsule, Take 1 capsule (40 mg total) by mouth daily., Disp: 30 capsule, Rfl: 6 .  gabapentin (NEURONTIN) 300 MG capsule, Take 300 mg by mouth 2 (two) times daily., Disp: , Rfl:  .  PROAIR HFA 108 (90 Base) MCG/ACT inhaler, INHALE 1-2 PUFFS INTO  THE LUNGS EVERY SIX HOURS AS NEEDED FOR WHEEZING OR SHORTNESS OF BREATH, Disp: 8.5 g, Rfl: 2 .  Tiotropium Bromide-Olodaterol (STIOLTO RESPIMAT) 2.5-2.5 MCG/ACT AERS, Inhale 2 puffs into the lungs daily., Disp: 1 Inhaler, Rfl: 5 .  traZODone (DESYREL) 50 MG tablet, Take 50-150 mg by mouth at bedtime. , Disp: , Rfl:  .  umeclidinium-vilanterol (ANORO ELLIPTA) 62.5-25 MCG/INH AEPB, Inhale 1 puff into the lungs daily., Disp: 60 each, Rfl: 4 .  VESICARE 10 MG tablet, TAKE 1 TABLET BY MOUTH DAILY, Disp: 30 tablet, Rfl: 2  Allergies  Allergen Reactions  . Codeine Nausea Only  . Haldol [Haloperidol] Other (See Comments)    Reaction: Pt "felt like her neck was going to snap." Sever neck stiffness. Reaction: Pt "felt like her neck was going to snap."  . Haloperidol Lactate Other (See  Comments)    Sever neck stiffness.  . Trileptal [Oxcarbazepine] Swelling     Review of Systems  Constitutional: Negative for chills and fever.  Musculoskeletal: Positive for joint pain.    Please see history of present illness for complete discussion of ROS  Objective  Vitals:   12/27/16 0948  BP: 140/78  Pulse: 99  Resp: 17  Temp: 97.8 F (36.6 C)  TempSrc: Oral  SpO2: 99%  Weight: 206 lb 11.2 oz (93.8 kg)  Height: 5\' 3"  (1.6 m)    Physical Exam  Constitutional: She is well-developed, well-nourished, and in no distress.  Musculoskeletal:       Right knee: Tenderness found. Lateral joint line tenderness noted.       Legs: Tenderness at the posterior portion of the right knee  Nursing note and vitals reviewed.       Assessment & Plan  1. Baker's cyst of knee, right   2. Primary osteoarthritis of right knee Patient understands that she may have to undergo surgery, advised to check with her insurance company if the surgical procedure is covered.   Kimari Coudriet [facility] 12/27/2016 10:09 AM

## 2016-12-28 LAB — VITAMIN D 25 HYDROXY (VIT D DEFICIENCY, FRACTURES): Vit D, 25-Hydroxy: 30 ng/mL (ref 30–100)

## 2016-12-29 ENCOUNTER — Ambulatory Visit
Admission: RE | Admit: 2016-12-29 | Discharge: 2016-12-29 | Disposition: A | Discharge status: Home / Self Care | Payer: Medicaid Other | Source: Ambulatory Visit | Attending: Family Medicine | Admitting: Family Medicine

## 2016-12-29 DIAGNOSIS — Z780 Asymptomatic menopausal state: Secondary | ICD-10-CM | POA: Diagnosis present

## 2016-12-29 DIAGNOSIS — Z1231 Encounter for screening mammogram for malignant neoplasm of breast: Secondary | ICD-10-CM | POA: Insufficient documentation

## 2016-12-29 DIAGNOSIS — M85851 Other specified disorders of bone density and structure, right thigh: Secondary | ICD-10-CM | POA: Insufficient documentation

## 2016-12-29 DIAGNOSIS — Z1239 Encounter for other screening for malignant neoplasm of breast: Secondary | ICD-10-CM

## 2017-01-17 ENCOUNTER — Ambulatory Visit: Payer: Self-pay | Admitting: Family Medicine

## 2017-01-20 ENCOUNTER — Encounter: Payer: Self-pay | Admitting: Family Medicine

## 2017-01-20 ENCOUNTER — Ambulatory Visit (INDEPENDENT_AMBULATORY_CARE_PROVIDER_SITE_OTHER): Payer: Medicaid Other | Admitting: Family Medicine

## 2017-01-20 VITALS — BP 140/80 | HR 79 | Temp 97.8°F | Resp 17 | Ht 63.0 in | Wt 208.9 lb

## 2017-01-20 DIAGNOSIS — Z9189 Other specified personal risk factors, not elsewhere classified: Secondary | ICD-10-CM | POA: Diagnosis not present

## 2017-01-20 DIAGNOSIS — E782 Mixed hyperlipidemia: Secondary | ICD-10-CM | POA: Diagnosis not present

## 2017-01-20 DIAGNOSIS — R739 Hyperglycemia, unspecified: Secondary | ICD-10-CM

## 2017-01-20 MED ORDER — ALENDRONATE SODIUM 70 MG PO TABS: 70.0000 mg | ORAL_TABLET | ORAL | 11 refills | Status: DC

## 2017-01-20 MED ORDER — ROSUVASTATIN CALCIUM 5 MG PO TABS: 5.0000 mg | ORAL_TABLET | Freq: Every day | ORAL | 0 refills | Status: DC

## 2017-01-20 NOTE — Progress Notes (Signed)
Name: Kathryn Ramirez   MRN: [id number]    DOB: June 13, 1951   Date:01/20/2017       Progress Note  Subjective  Chief Complaint  Chief Complaint  Patient presents with  . Follow-up    Discuss statin therapy    Hyperlipidemia  This is a recurrent problem. The problem is uncontrolled. Recent lipid tests were reviewed and are high. Exacerbating diseases include obesity. She is currently on no antihyperlipidemic treatment. Risk factors for coronary artery disease include dyslipidemia.   Plan 160 each test showed osteopenia with T score of -2.0 at the right femur neck, FRAX Kathryn Ramirez showed a 10 year probability of major osteoporotic fracture of 24.9% and hip fracture 4.2%, patient is a candidate for Fosamax which will be started today Past Medical History:  Diagnosis Date  . Acid reflux disease   . Asthma   . Bipolar 1 disorder ([place])   . Liver hemangioma   . Lung nodule   . Psoriasis   . Rheumatoid arthritis ([place])   . Tardive dyskinesia   . Wears dentures    full upper and lower    Past Surgical History:  Procedure Laterality Date  . CATARACT EXTRACTION W/ INTRAOCULAR LENS  IMPLANT, BILATERAL    . CERVICAL SPINE SURGERY  2017   [facility] [place]  . COLONOSCOPY WITH PROPOFOL N/A 09/14/2016   Procedure: COLONOSCOPY WITH PROPOFOL;  Surgeon: Lucilla Lame, MD;  Location: [facility] ENDOSCOPY;  Service: Endoscopy;  Laterality: N/A;  . ESOPHAGOGASTRODUODENOSCOPY (EGD) WITH PROPOFOL N/A 09/14/2016   Procedure: ESOPHAGOGASTRODUODENOSCOPY (EGD) WITH PROPOFOL;  Surgeon: Lucilla Lame, MD;  Location: [facility] ENDOSCOPY;  Service: Endoscopy;  Laterality: N/A;  . FLEXIBLE BRONCHOSCOPY N/A 01/13/2016   Procedure: FLEXIBLE BRONCHOSCOPY;  Surgeon: Vilinda Boehringer, MD;  Location: [facility] ORS;  Service: Cardiopulmonary;  Laterality: N/A;  . NEUROPLASTY MAJOR NERVE    . POLYPECTOMY    . THROAT SURGERY      Family History  Problem Relation Age of Onset  . Cancer Mother        Esophageal Ca  . Asthma Brother    . Breast cancer Neg Hx     Social History   Social History  . Marital status: Single    Spouse name: N/A  . Number of children: N/A  . Years of education: N/A   Occupational History  . Not on file.   Social History Main Topics  . Smoking status: Former Smoker    Types: E-cigarettes    Quit date: 09/19/2015  . Smokeless tobacco: Never Used  . Alcohol use No     Comment: 03/31/2012 sobierty   . Drug use: No  . Sexual activity: No   Other Topics Concern  . Not on file   Social History Narrative  . No narrative on file     Current Outpatient Prescriptions:  .  ARIPiprazole (ABILIFY) 10 MG tablet, Take 10 mg by mouth daily., Disp: , Rfl:  .  beclomethasone (QVAR) 80 MCG/ACT inhaler, Inhale 1 puff into the lungs 2 (two) times daily., Disp: 1 Inhaler, Rfl: 6 .  buPROPion (WELLBUTRIN XL) 150 MG 24 hr tablet, Take 150 mg by mouth daily., Disp: , Rfl:  .  DIAZEPAM PO, Take 2.5 mg by mouth 2 (two) times daily., Disp: , Rfl:  .  esomeprazole (NEXIUM) 40 MG capsule, Take 1 capsule (40 mg total) by mouth daily., Disp: 30 capsule, Rfl: 6 .  gabapentin (NEURONTIN) 300 MG capsule, Take 300 mg by mouth 2 (two) times daily., Disp: ,  Rfl:  .  PROAIR HFA 108 (90 Base) MCG/ACT inhaler, INHALE 1-2 PUFFS INTO THE LUNGS EVERY SIX HOURS AS NEEDED FOR WHEEZING OR SHORTNESS OF BREATH, Disp: 8.5 g, Rfl: 2 .  Tiotropium Bromide-Olodaterol (STIOLTO RESPIMAT) 2.5-2.5 MCG/ACT AERS, Inhale 2 puffs into the lungs daily., Disp: 1 Inhaler, Rfl: 5 .  traZODone (DESYREL) 50 MG tablet, Take 50-150 mg by mouth at bedtime. , Disp: , Rfl:  .  umeclidinium-vilanterol (ANORO ELLIPTA) 62.5-25 MCG/INH AEPB, Inhale 1 puff into the lungs daily., Disp: 60 each, Rfl: 4 .  VESICARE 10 MG tablet, TAKE 1 TABLET BY MOUTH DAILY, Disp: 30 tablet, Rfl: 2  Allergies  Allergen Reactions  . Codeine Nausea Only  . Haldol [Haloperidol] Other (See Comments)    Reaction: Pt "felt like her neck was going to snap." Sever neck  stiffness. Reaction: Pt "felt like her neck was going to snap."  . Haloperidol Lactate Other (See Comments)    Sever neck stiffness.  . Trileptal [Oxcarbazepine] Swelling     ROS  please see history of present illness for complete discussion of ROS  Objective  Vitals:   01/20/17 1011  BP: 140/80  Pulse: 79  Resp: 17  Temp: 97.8 F (36.6 C)  TempSrc: Oral  SpO2: 96%  Weight: 208 lb 14.4 oz (94.8 kg)  Height: 5\' 3"  (1.6 m)    Physical Exam  Constitutional: She is oriented to person, place, and time and well-developed, well-nourished, and in no distress.  HENT:  Mouth/Throat: Oropharynx is clear and moist. She has dentures.  Cardiovascular: Normal rate, regular rhythm and normal heart sounds.   No murmur heard. Pulmonary/Chest: Effort normal and breath sounds normal. She has no wheezes.  Abdominal: Soft. Bowel sounds are normal. There is no tenderness.  Neurological: She is alert and oriented to person, place, and time.  Nursing note and vitals reviewed.     Assessment & Plan  1. Hyperglycemia   A1c 7.4%, which is consistent with diabetes, she will return for an appointment to discuss pharmacotherapy - POCT glycosylated hemoglobin (Hb A1C)  2. Mixed hyperlipidemia  start on low-dose statin, liver function is within normal range. Continue to monitor every 3 months because of history of hepatitis C status post treatment - rosuvastatin (CRESTOR) 5 MG tablet; Take 1 tablet (5 mg total) by mouth daily.  Dispense: 90 tablet; Refill: 0  3. High risk for hip fracture We will start on alendronate to be taken every week, advised to start calcium and vitamin D supplementation. Repeat DEXA scan in 1 year - alendronate (FOSAMAX) 70 MG tablet; Take 1 tablet (70 mg total) by mouth every 7 (seven) days. Take with a full glass of water on an empty stomach.  Dispense: 4 tablet; Refill: 11   Kathryn Ramirez [facility] 01/20/2017 10:25  AM

## 2017-01-25 LAB — POCT GLYCOSYLATED HEMOGLOBIN (HGB A1C): Hemoglobin A1C: 7.4

## 2017-02-02 ENCOUNTER — Encounter: Payer: Self-pay | Admitting: Family Medicine

## 2017-02-02 ENCOUNTER — Ambulatory Visit (INDEPENDENT_AMBULATORY_CARE_PROVIDER_SITE_OTHER): Payer: Medicaid Other | Admitting: Family Medicine

## 2017-02-02 VITALS — BP 138/81 | HR 81 | Temp 97.9°F | Resp 16 | Ht 63.0 in | Wt 205.7 lb

## 2017-02-02 DIAGNOSIS — E1165 Type 2 diabetes mellitus with hyperglycemia: Secondary | ICD-10-CM | POA: Diagnosis not present

## 2017-02-02 DIAGNOSIS — IMO0001 Reserved for inherently not codable concepts without codable children: Secondary | ICD-10-CM

## 2017-02-02 DIAGNOSIS — E782 Mixed hyperlipidemia: Secondary | ICD-10-CM | POA: Diagnosis not present

## 2017-02-02 MED ORDER — ROSUVASTATIN CALCIUM 10 MG PO TABS: 10.0000 mg | ORAL_TABLET | Freq: Every day | ORAL | 0 refills | Status: DC

## 2017-02-02 MED ORDER — METFORMIN HCL ER 500 MG PO TB24: 500.0000 mg | ORAL_TABLET | Freq: Every day | ORAL | 0 refills | Status: DC

## 2017-02-02 NOTE — Progress Notes (Signed)
Name: Kathryn Ramirez   MRN: [id number]    DOB: [birth year]   Date:02/02/2017       Progress Note  Subjective  Chief Complaint  Chief Complaint  Patient presents with  . Follow-up    Discuss pharmacotherapy    Diabetes  She presents for her initial diabetic visit. She has type 2 diabetes mellitus. Her disease course has been stable. Hypoglycemia symptoms include dizziness, nervousness/anxiousness and sweats. Associated symptoms include polydipsia and polyuria. Pertinent negatives for diabetes include no chest pain, no fatigue and no foot paresthesias. There are no hypoglycemic complications. There are no diabetic complications. Pertinent negatives for diabetic complications include no CVA, heart disease or peripheral neuropathy. When asked about current treatments, none were reported. She is following a generally unhealthy diet.      Past Medical History:  Diagnosis Date  . Acid reflux disease   . Asthma   . Bipolar 1 disorder ([place])   . Liver hemangioma   . Lung nodule   . Psoriasis   . Rheumatoid arthritis ([place])   . Tardive dyskinesia   . Wears dentures    full upper and lower    Past Surgical History:  Procedure Laterality Date  . CATARACT EXTRACTION W/ INTRAOCULAR LENS  IMPLANT, BILATERAL    . CERVICAL SPINE SURGERY  2017   [facility] [place]  . COLONOSCOPY WITH PROPOFOL N/A 09/14/2016   Procedure: COLONOSCOPY WITH PROPOFOL;  Surgeon: Lucilla Lame, MD;  Location: [facility] END[facility]COPY;  Service: Endoscopy;  Laterality: N/A;  . ESOPHAGOGASTRODUODENOSCOPY (EGD) WITH PROPOFOL N/A 09/14/2016   Procedure: ESOPHAGOGASTRODUODENOSCOPY (EGD) WITH PROPOFOL;  Surgeon: Lucilla Lame, MD;  Location: [facility] END[facility];  Service: Endoscopy;  Laterality: N/A;  . FLEXIBLE BRONCHOSCOPY N/A 01/13/2016   Procedure: FLEXIBLE BRONCHOSCOPY;  Surgeon: Vilinda Boehringer, MD;  Location: [facility];  Service: Cardiopulmonary;  Laterality: N/A;  . NEUROPLASTY MAJOR NERVE    . POLYPECTOMY    . THROAT SURGERY       Family History  Problem Relation Age of Onset  . Cancer Mother        Esophageal Ca  . Asthma Brother   . Breast cancer Neg Hx     Social History   Social History  . Marital status: Single    Spouse name: N/A  . Number of children: N/A  . Years of education: N/A   Occupational History  . Not on file.   Social History Main Topics  . Smoking status: Former Smoker    Types: E-cigarettes    Quit date: 09/19/2015  . Smokeless tobacco: Never Used  . Alcohol use No     Comment: 03/31/2012 sobierty   . Drug use: No  . Sexual activity: No   Other Topics Concern  . Not on file   Social History Narrative  . No narrative on file     Current Outpatient Prescriptions:  .  alendronate (FOSAMAX) 70 MG tablet, Take 1 tablet (70 mg total) by mouth every 7 (seven) days. Take with a full glass of water on an empty stomach., Disp: 4 tablet, Rfl: 11 .  ARIPiprazole (ABILIFY) 10 MG tablet, Take 10 mg by mouth daily., Disp: , Rfl:  .  beclomethasone (QVAR) 80 MCG/ACT inhaler, Inhale 1 puff into the lungs 2 (two) times daily., Disp: 1 Inhaler, Rfl: 6 .  buPROPion (WELLBUTRIN XL) 150 MG 24 hr tablet, Take 150 mg by mouth daily., Disp: , Rfl:  .  DIAZEPAM PO, Take 2.5 mg by mouth 2 (two) times daily., Disp: ,  Rfl:  .  esomeprazole (NEXIUM) 40 MG capsule, Take 1 capsule (40 mg total) by mouth daily., Disp: 30 capsule, Rfl: 6 .  gabapentin (NEURONTIN) 300 MG capsule, Take 300 mg by mouth 2 (two) times daily., Disp: , Rfl:  .  methocarbamol (ROBAXIN) 500 MG tablet, Take 500 mg by mouth 2 (two) times daily., Disp: , Rfl:  .  PROAIR HFA 108 (90 Base) MCG/ACT inhaler, INHALE 1-2 PUFFS INTO THE LUNGS EVERY SIX HOURS AS NEEDED FOR WHEEZING OR SHORTNESS OF BREATH, Disp: 8.5 g, Rfl: 2 .  rosuvastatin (CRESTOR) 5 MG tablet, Take 1 tablet (5 mg total) by mouth daily., Disp: 90 tablet, Rfl: 0 .  Tiotropium Bromide-Olodaterol (STIOLTO RESPIMAT) 2.5-2.5 MCG/ACT AERS, Inhale 2 puffs into the lungs daily.,  Disp: 1 Inhaler, Rfl: 5 .  traZODone (DESYREL) 50 MG tablet, Take 50-150 mg by mouth at bedtime. , Disp: , Rfl:  .  umeclidinium-vilanterol (ANORO ELLIPTA) 62.5-25 MCG/INH AEPB, Inhale 1 puff into the lungs daily., Disp: 60 each, Rfl: 4 .  VESICARE 10 MG tablet, TAKE 1 TABLET BY MOUTH DAILY, Disp: 30 tablet, Rfl: 2  Allergies  Allergen Reactions  . Codeine Nausea Only  . Haldol [Haloperidol] Other (See Comments)    Reaction: Pt "felt like her neck was going to snap." Sever neck stiffness. Reaction: Pt "felt like her neck was going to snap."  . Haloperidol Lactate Other (See Comments)    Sever neck stiffness.  . Trileptal [Oxcarbazepine] Swelling     Review of Systems  Constitutional: Negative for fatigue.  Cardiovascular: Negative for chest pain.  Neurological: Positive for dizziness.  Endo/Heme/Allergies: Positive for polydipsia.  Psychiatric/Behavioral: The patient is nervous/anxious.       Objective  Vitals:   02/02/17 1021  BP: 138/81  Pulse: 81  Resp: 16  Temp: 97.9 F (36.6 C)  TempSrc: Oral  SpO2: 97%  Weight: 205 lb 11.2 oz (93.3 kg)  Height: 5\' 3"  (1.6 m)    Physical Exam  Constitutional: She is oriented to person, place, and time and well-developed, well-nourished, and in no distress.  HENT:  Head: Normocephalic and atraumatic.  Cardiovascular: Normal rate, regular rhythm and normal heart sounds.   No murmur heard. Pulmonary/Chest: Effort normal and breath sounds normal. She has no wheezes.  Neurological: She is alert and oriented to person, place, and time.  Psychiatric: Mood, memory, affect and judgment normal.  Nursing note and vitals reviewed.    Assessment & Plan  1. Uncontrolled type 2 diabetes mellitus without complication, without long-term current use of insulin ([place]) Reviewed in detail the pathophysiology of type 2 diabetes, patient's A1c is 7.4%, we'll start on metformin 500 mg daily, urine microalbumin will be obtained, follow-up in 3  months - metFORMIN (GLUCOPHAGE XR) 500 MG 24 hr tablet; Take 1 tablet (500 mg total) by mouth daily with breakfast.  Dispense: 90 tablet; Refill: 0 - Urine Microalbumin w/creat. ratio  2. Mixed hyperlipidemia Start on rosuvastatin for primary prevention and hyperlipidemia with the goal LDL less than 100 MG per DL, reassess in 3 months - rosuvastatin (CRESTOR) 10 MG tablet; Take 1 tablet (10 mg total) by mouth at bedtime.  Dispense: 90 tablet; Refill: 0   Emiley Digiacomo [facility] 02/02/2017 10:42 AM

## 2017-02-03 ENCOUNTER — Telehealth: Payer: Self-pay | Admitting: Internal Medicine

## 2017-02-03 LAB — MICROALBUMIN / CREATININE URINE RATIO
Creatinine, Urine: 56 mg/dL (ref 20–320)
MICROALB UR: 0.8 mg/dL
Microalb Creat Ratio: 14 mcg/mg creat (ref <30)

## 2017-02-03 NOTE — Telephone Encounter (Signed)
Pt calling stating she received her new inhaler  States the canister doesn't fit   Would like some advise on this Please call back

## 2017-02-03 NOTE — Telephone Encounter (Signed)
R/T call to patient for more detail. She just received Stiolto inhaler refill from pharmacy. She states vial does not fit properly insider canister. She states she can't pull old vial from old canister. Patient asked to bring by office for help. She doesn't drive, patient advised to return new med back to pharmacy. She will resume Anoro until this is resolved. Patient asked to give a follow-up call if she doesn't get resolved by pharmacy. Nothing further needed at this time.

## 2017-02-17 ENCOUNTER — Ambulatory Visit (INDEPENDENT_AMBULATORY_CARE_PROVIDER_SITE_OTHER): Payer: Medicaid Other | Admitting: Family Medicine

## 2017-02-17 ENCOUNTER — Encounter: Payer: Self-pay | Admitting: Family Medicine

## 2017-02-17 VITALS — BP 136/76 | HR 86 | Temp 98.0°F | Resp 16 | Ht 63.0 in | Wt 200.7 lb

## 2017-02-17 DIAGNOSIS — Z01818 Encounter for other preprocedural examination: Secondary | ICD-10-CM

## 2017-02-17 LAB — POCT INR: INR: 1

## 2017-02-17 NOTE — Progress Notes (Signed)
Name: Kathryn Ramirez   MRN: [id number]    DOB: 02-18-1951   Date:02/17/2017       Progress Note  Subjective  Chief Complaint  Chief Complaint  Patient presents with  . Follow-up    Surgicial Clearance    HPI  Pt. Presents for surgical clearance for a proposed elective right total knee replacement, date yet to be determined, to be performed by Dr. Sabra Heck.  Kathryn Ramirez has Asthma and is being followed by Pulmonology, will repeat CXR. Kathryn Ramirez has history of Hepatitis C, s/p treatment with Interferon, will obtain PT-INR. Kathryn Ramirez had a normal stress test last year, EKG was normal from October 2017. We will order Urinalysis. Recent testing completed in last six months include CBC, CMP, A1c. Kathryn Ramirez quit smoking in 2016.   Past Medical History:  Diagnosis Date  . Acid reflux disease   . Asthma   . Bipolar 1 disorder ([place])   . Liver hemangioma   . Lung nodule   . Psoriasis   . Rheumatoid arthritis ([place])   . Tardive dyskinesia   . Wears dentures    full upper and lower    Past Surgical History:  Procedure Laterality Date  . CATARACT EXTRACTION W/ INTRAOCULAR LENS  IMPLANT, BILATERAL    . CERVICAL SPINE SURGERY  2017   [facility] [place]  . COLONOSCOPY WITH PROPOFOL N/A 09/14/2016   Procedure: COLONOSCOPY WITH PROPOFOL;  Surgeon: Lucilla Lame, MD;  Location: [facility] END[facility];  Service: Endoscopy;  Laterality: N/A;  . ESOPHAGOGASTRODUODENOSCOPY (EGD) WITH PROPOFOL N/A 09/14/2016   Procedure: ESOPHAGOGASTRODUODENOSCOPY (EGD) WITH PROPOFOL;  Surgeon: Lucilla Lame, MD;  Location: [facility];  Service: Endoscopy;  Laterality: N/A;  . FLEXIBLE BRONCHOSCOPY N/A 01/13/2016   Procedure: FLEXIBLE BRONCHOSCOPY;  Surgeon: Vilinda Boehringer, MD;  Location: [facility];  Service: Cardiopulmonary;  Laterality: N/A;  . NEUROPLASTY MAJOR NERVE    . POLYPECTOMY    . THROAT SURGERY      Family History  Problem Relation Age of Onset  . Cancer Mother        Esophageal Ca  . Asthma Brother   . Breast cancer Neg Hx      Social History   Social History  . Marital status: Single    Spouse name: N/A  . Number of children: N/A  . Years of education: N/A   Occupational History  . Not on file.   Social History Main Topics  . Smoking status: Former Smoker    Types: E-cigarettes    Quit date: 09/19/2015  . Smokeless tobacco: Never Used  . Alcohol use No     Comment: 03/31/2012 sobierty   . Drug use: No  . Sexual activity: No   Other Topics Concern  . Not on file   Social History Narrative  . No narrative on file     Current Outpatient Prescriptions:  .  alendronate (FOSAMAX) 70 MG tablet, Take 1 tablet (70 mg total) by mouth every 7 (seven) days. Take with a full glass of water on an empty stomach., Disp: 4 tablet, Rfl: 11 .  ARIPiprazole (ABILIFY) 10 MG tablet, Take 10 mg by mouth daily., Disp: , Rfl:  .  beclomethasone (QVAR) 80 MCG/ACT inhaler, Inhale 1 puff into the lungs 2 (two) times daily., Disp: 1 Inhaler, Rfl: 6 .  buPROPion (WELLBUTRIN XL) 150 MG 24 hr tablet, Take 150 mg by mouth daily., Disp: , Rfl:  .  DIAZEPAM PO, Take 2.5 mg by mouth 2 (two) times daily., Disp: , Rfl:  .  esomeprazole (NEXIUM) 40 MG capsule, Take 1 capsule (40 mg total) by mouth daily., Disp: 30 capsule, Rfl: 6 .  gabapentin (NEURONTIN) 300 MG capsule, Take 300 mg by mouth 2 (two) times daily., Disp: , Rfl:  .  metFORMIN (GLUCOPHAGE XR) 500 MG 24 hr tablet, Take 1 tablet (500 mg total) by mouth daily with breakfast., Disp: 90 tablet, Rfl: 0 .  methocarbamol (ROBAXIN) 500 MG tablet, Take 500 mg by mouth 2 (two) times daily., Disp: , Rfl:  .  PROAIR HFA 108 (90 Base) MCG/ACT inhaler, INHALE 1-2 PUFFS INTO THE LUNGS EVERY SIX HOURS AS NEEDED FOR WHEEZING OR SHORTNESS OF BREATH, Disp: 8.5 g, Rfl: 2 .  rosuvastatin (CRESTOR) 10 MG tablet, Take 1 tablet (10 mg total) by mouth at bedtime., Disp: 90 tablet, Rfl: 0 .  Tiotropium Bromide-Olodaterol (STIOLTO RESPIMAT) 2.5-2.5 MCG/ACT AERS, Inhale 2 puffs into the lungs  daily., Disp: 1 Inhaler, Rfl: 5 .  traZODone (DESYREL) 50 MG tablet, Take 50-150 mg by mouth at bedtime. , Disp: , Rfl:  .  umeclidinium-vilanterol (ANORO ELLIPTA) 62.5-25 MCG/INH AEPB, Inhale 1 puff into the lungs daily., Disp: 60 each, Rfl: 4 .  VESICARE 10 MG tablet, TAKE 1 TABLET BY MOUTH DAILY, Disp: 30 tablet, Rfl: 2  Allergies  Allergen Reactions  . Codeine Nausea Only  . Haldol [Haloperidol] Other (See Comments)    Reaction: Pt "felt like her neck was going to snap." Sever neck stiffness. Reaction: Pt "felt like her neck was going to snap."  . Haloperidol Lactate Other (See Comments)    Sever neck stiffness.  . Trileptal [Oxcarbazepine] Swelling     ROS  Please see history of present illness for complete discussion of ROS  Objective  Vitals:   02/17/17 1108  BP: 136/76  Pulse: 86  Resp: 16  Temp: 98 F (36.7 C)  TempSrc: Oral  SpO2: 96%  Weight: 200 lb 11.2 oz (91 kg)  Height: 5\' 3"  (1.6 m)    Physical Exam  Constitutional: Kathryn Ramirez is oriented to person, place, and time and well-developed, well-nourished, and in no distress.  HENT:  Head: Normocephalic and atraumatic.  Mouth/Throat: Oropharynx is clear and moist.  Neck: Neck supple.  Cardiovascular: Normal rate, regular rhythm and normal heart sounds.   No murmur heard. Pulmonary/Chest: Effort normal and breath sounds normal. Kathryn Ramirez has no wheezes.  Abdominal: Soft. Bowel sounds are normal. There is no tenderness.  Musculoskeletal: Kathryn Ramirez exhibits no edema.  Neurological: Kathryn Ramirez is alert and oriented to person, place, and time.  Psychiatric: Mood, memory, affect and judgment normal.  Nursing note and vitals reviewed.       Assessment & Plan   1. Preoperative clearance Obtain pertinent lab and imaging testing, EKG is normal sinus rhythm, point of care INR is 1.0, repeat chest x-ray since patient has moderate persistent asthma - Urinalysis - DG Chest 2 View; Future - EKG 12-Lead - POCT INR   Madelyn Tlatelpa [facility] 02/17/2017 11:21 AM

## 2017-02-18 LAB — URINALYSIS
BILIRUBIN URINE: NEGATIVE
GLUCOSE, UA: NEGATIVE
Hgb urine dipstick: NEGATIVE
KETONES UR: NEGATIVE
Leukocytes, UA: NEGATIVE
NITRITE: NEGATIVE
PH: 6 (ref 5.0–8.0)
Protein, ur: NEGATIVE
SPECIFIC GRAVITY, URINE: 1.006 (ref 1.001–1.035)

## 2017-02-23 ENCOUNTER — Telehealth: Payer: Self-pay | Admitting: Family Medicine

## 2017-02-23 NOTE — Telephone Encounter (Signed)
Kathryn Ramirez with [facility] (Dr Sabra Heck) is checking status on surgical clearance that was faxed on 6-22, 7/17 and again today. Pt is having right total knee replacement. Please return her call [phone] ext [phone]

## 2017-02-25 NOTE — Telephone Encounter (Signed)
Spoke with patient and CT scan is scheduled for Tuesday 03/01/2017, I also spoke with Judeen Hammans from [facility] and she has been notified as well, once CT scan results come back we will fax over surgical clearance as long as Dr. Manuella Ghazi approves

## 2017-03-04 ENCOUNTER — Telehealth: Payer: Self-pay | Admitting: Internal Medicine

## 2017-03-04 MED ORDER — BECLOMETHASONE DIPROPIONATE 80 MCG/ACT IN AERS: 1.0000 | INHALATION_SPRAY | Freq: Two times a day (BID) | RESPIRATORY_TRACT | 6 refills | Status: DC

## 2017-03-04 NOTE — Telephone Encounter (Signed)
Patient needed clarification of which inhalers she took am/pm She takes Qvar 80 /Stiolto am then another puff Qvar 80 pm. Patient has d/c Anoro. Patient request refill of Qvar to [facility]. Nothing further needed.

## 2017-03-04 NOTE — Telephone Encounter (Signed)
Pt needs someone to call her regarding her inhalers. She is unsure of which one she is supposed to take.

## 2017-03-07 ENCOUNTER — Other Ambulatory Visit: Payer: Self-pay | Admitting: Family Medicine

## 2017-03-07 DIAGNOSIS — N3281 Overactive bladder: Secondary | ICD-10-CM

## 2017-03-09 ENCOUNTER — Telehealth: Payer: Self-pay | Admitting: Family Medicine

## 2017-03-09 NOTE — Telephone Encounter (Signed)
Pt states that Dr Sabra Heck office ([facility]) told her that she is needing a order for CT Scan due to her having a total knee replacement surgery. They are wanting Dr Manuella Ghazi to order this. Please return pt call [phone]

## 2017-03-10 NOTE — Telephone Encounter (Signed)
I contacted patient and she is not aware of any CT scan ordered by requested by orthopedics. She is scheduled to have a chest x-ray on Monday, August 20 for preoperative clearance for total knee replacement. I have asked her to contact the orthopedics office directly to inquire about the CT scan and she verbalized agreement.

## 2017-03-10 NOTE — Telephone Encounter (Signed)
Dr. Manuella Ghazi, Pt states that Dr Sabra Heck office ([facility]) told her that she is needing a order for CT Scan due to her having a total knee replacement surgery. They are wanting Dr Manuella Ghazi to order this. Please return pt call [phone]

## 2017-03-11 NOTE — Telephone Encounter (Signed)
Pt called [facility] and they informed her that she does not need a CT. Pt states that she will keep the xray appointment for Monday.

## 2017-03-14 ENCOUNTER — Ambulatory Visit
Admission: RE | Admit: 2017-03-14 | Discharge: 2017-03-14 | Disposition: A | Discharge status: Home / Self Care | Payer: Medicaid Other | Source: Ambulatory Visit | Attending: Family Medicine | Admitting: Family Medicine

## 2017-03-14 DIAGNOSIS — Z01818 Encounter for other preprocedural examination: Secondary | ICD-10-CM | POA: Insufficient documentation

## 2017-03-14 DIAGNOSIS — J9811 Atelectasis: Secondary | ICD-10-CM | POA: Diagnosis not present

## 2017-03-14 DIAGNOSIS — Z87891 Personal history of nicotine dependence: Secondary | ICD-10-CM | POA: Diagnosis not present

## 2017-03-14 DIAGNOSIS — I700 Atherosclerosis of aorta: Secondary | ICD-10-CM | POA: Insufficient documentation

## 2017-03-18 ENCOUNTER — Telehealth: Payer: Self-pay | Admitting: Family Medicine

## 2017-03-18 NOTE — Telephone Encounter (Signed)
Spoke with sherry and I received the fax forms. Gave the forms to Dr. Manuella Ghazi as requested.

## 2017-03-18 NOTE — Telephone Encounter (Signed)
Sherry from [facility] is checking status on the surgical clearance for this patient. Please return call [phone] ext [phone]. She is in the process of faxing over another surgical clearance form

## 2017-03-22 ENCOUNTER — Telehealth: Payer: Self-pay

## 2017-03-22 ENCOUNTER — Telehealth: Payer: Self-pay | Admitting: Family Medicine

## 2017-03-22 NOTE — Telephone Encounter (Signed)
Dr. Manuella Ghazi please order pulmonary referral for evaluation for changes on chest x-ray for chronic bronchitis, I spoke with Everlyn from [facility] and she has been notified that you want to hold off on surgical clearance until patient is evaluated by pulmonary, please enter referral

## 2017-03-22 NOTE — Telephone Encounter (Signed)
Pt called asking about her surgical clearance that has been faxed over a few time. Dr Sanjuan Dame office is waiting to get this back. Please call pt back.

## 2017-03-29 ENCOUNTER — Encounter: Payer: Self-pay | Admitting: Family Medicine

## 2017-03-29 ENCOUNTER — Ambulatory Visit (INDEPENDENT_AMBULATORY_CARE_PROVIDER_SITE_OTHER): Payer: Medicaid Other | Admitting: Family Medicine

## 2017-03-29 NOTE — Progress Notes (Signed)
This encounter was created in error - please disregard.

## 2017-04-01 NOTE — Progress Notes (Signed)
* [name]     Assessment and Plan:  The patient is a 66 year old female with a history of chronic bronchitis, persistent asthma, vocal cord polyps, GERD, hoarseness of voice.  Asthma. --Control has improved with stiolto, but still not well controlled, using rescue twice daily.   Pre-operative respiratory exam.  -Patient is cleared for surgery with moderate risk of pulmonary, occasions, these were explained today. She is asked to postpone her surgery should she experience a chest cold or  respiratory symptoms of dyspnea, increased mucus production, cough.  Cough. --Appears adequately controlled at this time.   Vocal cord polyps and hoarseness of voice. --These can be contributed by qvar, therefore should stop this, and continue with stiolto and nexium.  --Continue to follow up with ENT.   GERD. -With significant symptoms including nausea, is now status post colonoscopy and EGD by gastroenterology. -GERD symptoms are controlled by Nexium, to continue.  Lung nodules.  --Per history she was followed at [facility] and told that she had lung nodules. Most recent CT chest 11/2015 showed no evidence of nodules. No need for further workup.    Date: 04/01/2017  MRN# [id number] Kathryn Ramirez 28-Mar-1951   Kathryn Ramirez is a 66 y.o. old female seen in follow up for chief complaint of  Chief Complaint  Patient presents with  . Asthma    Pt states she has cough with some mucus yellow-white.  . Shortness of Breath    with exhertion but no chest tightness.     HPI:   Patient is a pleasant 66 year old female past medical history of bipolar, tardive dyskinesia, rheumatoid arthritis, hypertension, multiple pulmonary nodules, asthma, acid reflux disease, obesity. She has a history of vocal cord polyps, followed by [facility]. She is a former smoker quit in August 2016, previously smoked one pack per day for 30 years. She is planned for have a right TKA and would like  clearance.    At last visit, she was having poorly controlled asthma while on Qvar. Therefore Qvar was discontinued and switched to [employer] inhaler. Currently she is taking stiolto, she takes the qvar 80 1 puff twice daily, and qvar 40 in the afternoon as a rescue inhaler for coughing. She is taking proventil as rescue inhaler once daily every day. She has obvious confusion about medications.    She visits her daughter twice per week who has a dog and a cat. She no longer has symptoms of reflux and she is taking nexium.   PFT on 12/11/15; Slightly reduced FEV1, normal FVC, ratio, RV, slightly increased RV/TLC, c/w mild air trapping. Normal DLCO, normal flow volume loops.    Hx of Right Vocal cord polyp removal at [facility] [place], [place], 2013. Now with Right VC scarring. Followed previously by [facility]. Had a bronchoscopy 01/13/16 due to chronic cough - negative BAL  Medication:   Outpatient Encounter Prescriptions as of 04/04/2017  Medication Sig  . alendronate (FOSAMAX) 70 MG tablet Take 1 tablet (70 mg total) by mouth every 7 (seven) days. Take with a full glass of water on an empty stomach.  . ARIPiprazole (ABILIFY) 10 MG tablet Take 10 mg by mouth daily.  . beclomethasone (QVAR) 80 MCG/ACT inhaler Inhale 1 puff into the lungs 2 (two) times daily.  [name] buPROPion (WELLBUTRIN XL) 150 MG 24 hr tablet Take 150 mg by mouth daily.  [name] DIAZEPAM PO Take 2.5 mg by mouth 2 (two) times daily.  [name] esomeprazole (NEXIUM) 40 MG capsule  Take 1 capsule (40 mg total) by mouth daily.  [name] gabapentin (NEURONTIN) 300 MG capsule Take 300 mg by mouth 2 (two) times daily.  . metFORMIN (GLUCOPHAGE XR) 500 MG 24 hr tablet Take 1 tablet (500 mg total) by mouth daily with breakfast.  . methocarbamol (ROBAXIN) 500 MG tablet Take 500 mg by mouth 2 (two) times daily.  [name] PROAIR HFA 108 (90 Base) MCG/ACT inhaler INHALE 1-2 PUFFS INTO THE LUNGS EVERY SIX HOURS AS NEEDED FOR WHEEZING OR SHORTNESS OF BREATH  . rosuvastatin  (CRESTOR) 10 MG tablet Take 1 tablet (10 mg total) by mouth at bedtime.  . Tiotropium Bromide-Olodaterol (STIOLTO RESPIMAT) 2.5-2.5 MCG/ACT AERS Inhale 2 puffs into the lungs daily.  . traZODone (DESYREL) 50 MG tablet Take 50-150 mg by mouth at bedtime.   [name] umeclidinium-vilanterol (ANORO ELLIPTA) 62.5-25 MCG/INH AEPB Inhale 1 puff into the lungs daily.  . VESICARE 10 MG tablet TAKE 1 TABLET BY MOUTH DAILY   No facility-administered encounter medications on file as of 04/04/2017.      Allergies:  Codeine; Haldol [haloperidol]; Haloperidol lactate; and Trileptal [oxcarbazepine]  Review of Systems: Gen:  Denies  fever, sweats. HEENT: Denies blurred vision. Cvc:  No dizziness, chest pain or heaviness Resp:   Denies cough or sputum porduction. Gi: Denies swallowing difficulty, stomach pain. constipation, bowel incontinence Gu:  Denies bladder incontinence, burning urine Ext:   No Joint pain, stiffness. Skin: No skin rash, easy bruising. Endoc:  No polyuria, polydipsia. Psych: No depression, insomnia. Other:  All other systems were reviewed and found to be negative other than what is mentioned in the HPI.   Physical Examination:   VS: BP 140/88 (BP Location: Left Arm, Cuff Size: Normal)   Pulse (!) 104   Resp 16   Ht 5\' 3"  (1.6 m)   Wt 195 lb (88.5 kg)   SpO2 95%   BMI 34.54 kg/m   General Appearance: No distress  Neuro:without focal findings,  speech normal,  HEENT: PERRLA, EOM intact. Pulmonary: normal breath sounds, No wheezing.   CardiovascularNormal S1,S2.  No m/r/g.   Abdomen: Benign, Soft, non-tender. Renal:  No costovertebral tenderness  GU:  Not performed at this time. Endoc: No evident thyromegaly, no signs of acromegaly. Skin:   warm, no rash. Extremities: normal, no cyanosis, clubbing.   LABORATORY PANEL:   CBC No results for input(s): WBC, HGB, HCT, PLT in the last 168  hours. ------------------------------------------------------------------------------------------------------------------  Chemistries  No results for input(s): NA, K, CL, CO2, GLUCOSE, BUN, CREATININE, CALCIUM, MG, AST, ALT, ALKPHOS, BILITOT in the last 168 hours.  Invalid input(s): GFRCGP ------------------------------------------------------------------------------------------------------------------  Cardiac Enzymes No results for input(s): TROPONINI in the last 168 hours. ------------------------------------------------------------  RADIOLOGY:   No results found for this or any previous visit. Results for orders placed during the hospital encounter of 08/15/16  DG Chest 2 View   Narrative CLINICAL DATA:  Cough and shortness of breath, 3 days duration. History of lung nodules.  EXAM: CHEST  2 VIEW  COMPARISON:  CT 12/10/2015.  Radiography 09/01/2015.  FINDINGS: Heart size is normal. There is aortic atherosclerosis. Mild pleural and parenchymal scarring in the lower lungs. No sign of active infiltrate, mass, effusion or collapse. Ordinary degenerative changes affect the spine. Previous cervical ACDF.  IMPRESSION: No active disease. Pleural and parenchymal scarring in the lower chest.   Electronically Signed   By: Nelson Chimes M.D.   On: 08/15/2016 15:20    ------------------------------------------------------------------------------------------------------------------  Thank  you for allowing [facility] to assist in the care of your patient. Our recommendations are noted above.  Please contact us if we can be of further service.   Marda Stalker, MD.  [facility] Office Number: [phone]  Patricia Pesa, M.D.  Vilinda Boehringer, M.D.  Merton Border, M.D  04/01/2017

## 2017-04-04 ENCOUNTER — Ambulatory Visit (INDEPENDENT_AMBULATORY_CARE_PROVIDER_SITE_OTHER): Payer: Medicaid Other | Admitting: Internal Medicine

## 2017-04-04 ENCOUNTER — Encounter: Payer: Self-pay | Admitting: Internal Medicine

## 2017-04-04 VITALS — BP 140/88 | HR 104 | Resp 16 | Ht 63.0 in | Wt 195.0 lb

## 2017-04-04 DIAGNOSIS — Z01818 Encounter for other preprocedural examination: Secondary | ICD-10-CM | POA: Diagnosis not present

## 2017-04-04 DIAGNOSIS — R053 Chronic cough: Secondary | ICD-10-CM

## 2017-04-04 DIAGNOSIS — J4530 Mild persistent asthma, uncomplicated: Secondary | ICD-10-CM

## 2017-04-04 DIAGNOSIS — R05 Cough: Secondary | ICD-10-CM

## 2017-04-04 NOTE — Patient Instructions (Addendum)
--  Stop ALL QVAR.   --Continue stiolto every day, use the rescue inhaler as needed as often as every 4 hours.   --You are cleared for your knee surgery. You should postpone your surgery if your are having a chest cold.

## 2017-04-07 ENCOUNTER — Other Ambulatory Visit: Payer: Self-pay | Admitting: Family Medicine

## 2017-04-07 DIAGNOSIS — N3281 Overactive bladder: Secondary | ICD-10-CM

## 2017-04-14 ENCOUNTER — Other Ambulatory Visit: Payer: Self-pay | Admitting: Family Medicine

## 2017-04-14 ENCOUNTER — Other Ambulatory Visit: Payer: Self-pay | Admitting: Internal Medicine

## 2017-04-14 DIAGNOSIS — E1165 Type 2 diabetes mellitus with hyperglycemia: Principal | ICD-10-CM

## 2017-04-14 DIAGNOSIS — IMO0001 Reserved for inherently not codable concepts without codable children: Secondary | ICD-10-CM

## 2017-04-20 ENCOUNTER — Other Ambulatory Visit: Payer: Self-pay | Admitting: Family Medicine

## 2017-04-20 DIAGNOSIS — IMO0001 Reserved for inherently not codable concepts without codable children: Secondary | ICD-10-CM

## 2017-04-20 DIAGNOSIS — E1165 Type 2 diabetes mellitus with hyperglycemia: Principal | ICD-10-CM

## 2017-05-02 ENCOUNTER — Ambulatory Visit (INDEPENDENT_AMBULATORY_CARE_PROVIDER_SITE_OTHER): Payer: Medicaid Other | Admitting: Family Medicine

## 2017-05-02 ENCOUNTER — Encounter: Payer: Self-pay | Admitting: Family Medicine

## 2017-05-02 ENCOUNTER — Other Ambulatory Visit: Payer: Self-pay | Admitting: Family Medicine

## 2017-05-02 VITALS — BP 132/82 | HR 95 | Ht 63.0 in | Wt 194.6 lb

## 2017-05-02 DIAGNOSIS — Z23 Encounter for immunization: Secondary | ICD-10-CM

## 2017-05-02 DIAGNOSIS — E669 Obesity, unspecified: Secondary | ICD-10-CM | POA: Diagnosis not present

## 2017-05-02 DIAGNOSIS — L409 Psoriasis, unspecified: Secondary | ICD-10-CM

## 2017-05-02 DIAGNOSIS — E1169 Type 2 diabetes mellitus with other specified complication: Secondary | ICD-10-CM

## 2017-05-02 DIAGNOSIS — E782 Mixed hyperlipidemia: Secondary | ICD-10-CM

## 2017-05-02 LAB — COMPLETE METABOLIC PANEL WITH GFR
AG RATIO: 1.3 (calc) (ref 1.0–2.5)
ALT: 12 U/L (ref 6–29)
AST: 12 U/L (ref 10–35)
Albumin: 3.8 g/dL (ref 3.6–5.1)
Alkaline phosphatase (APISO): 59 U/L (ref 33–130)
BILIRUBIN TOTAL: 0.3 mg/dL (ref 0.2–1.2)
BUN: 10 mg/dL (ref 7–25)
CALCIUM: 9.1 mg/dL (ref 8.6–10.4)
CHLORIDE: 98 mmol/L (ref 98–110)
CO2: 29 mmol/L (ref 20–32)
Creat: 0.69 mg/dL (ref 0.50–0.99)
GFR, EST AFRICAN AMERICAN: 107 mL/min/{1.73_m2} (ref >=60)
GFR, EST NON AFRICAN AMERICAN: 92 mL/min/{1.73_m2} (ref >=60)
GLOBULIN: 3 g/dL (ref 1.9–3.7)
Glucose, Bld: 99 mg/dL (ref 65–99)
POTASSIUM: 4.3 mmol/L (ref 3.5–5.3)
SODIUM: 135 mmol/L (ref 135–146)
TOTAL PROTEIN: 6.8 g/dL (ref 6.1–8.1)

## 2017-05-02 LAB — LIPID PANEL
CHOL/HDL RATIO: 2.6 (calc) (ref <5.0)
CHOLESTEROL: 126 mg/dL (ref <200)
HDL: 49 mg/dL — AB (ref >50)
LDL Cholesterol (Calc): 59 mg/dL (calc)
NON-HDL CHOLESTEROL (CALC): 77 mg/dL (ref <130)
TRIGLYCERIDES: 98 mg/dL (ref <150)

## 2017-05-02 LAB — POCT GLYCOSYLATED HEMOGLOBIN (HGB A1C): Hemoglobin A1C: 6.8

## 2017-05-02 MED ORDER — CALCIPOTRIENE 0.005 % EX OINT: TOPICAL_OINTMENT | Freq: Two times a day (BID) | CUTANEOUS | 0 refills | Status: DC

## 2017-05-02 MED ORDER — ROSUVASTATIN CALCIUM 10 MG PO TABS: 10.0000 mg | ORAL_TABLET | Freq: Every day | ORAL | 0 refills | Status: DC

## 2017-05-02 MED ORDER — METFORMIN HCL ER 500 MG PO TB24: 500.0000 mg | ORAL_TABLET | Freq: Every day | ORAL | 0 refills | Status: DC

## 2017-05-02 NOTE — Progress Notes (Signed)
Name: Kathryn Ramirez   MRN: [id number]    DOB: Jun 03, 1951   Date:05/02/2017       Progress Note  Subjective  Chief Complaint  Chief Complaint  Patient presents with  . Diabetes  . Hyperlipidemia  . Immunizations    flu vaccine today     Diabetes  She presents for her follow-up diabetic visit. She has type 2 diabetes mellitus. Her disease course has been stable. There are no hypoglycemic associated symptoms. Pertinent negatives for hypoglycemia include no headaches, nervousness/anxiousness or sweats. Associated symptoms include polyuria. Pertinent negatives for diabetes include no fatigue, no foot paresthesias and no polydipsia. Current diabetic treatment includes oral agent (monotherapy). She is following a generally healthy and diabetic diet. She rarely (unable to exercise becasue of arthritis in her right knee) participates in exercise. She monitors blood glucose at home 1-2 x per day. Her breakfast blood glucose range is generally 130-140 mg/dl. An ACE inhibitor/angiotensin II receptor blocker is not being taken.  Hyperlipidemia  This is a chronic problem. The problem is uncontrolled. Recent lipid tests were reviewed and are high. Pertinent negatives include no leg pain, myalgias or shortness of breath. Current antihyperlipidemic treatment includes statins.   Psoriasis: Pt. has history of psoriasis, she was first diagnosed with it in 1990 s while she was living in [place]. Since then, she has had rare outbreaks Has been treated with Dovonex with good treatment results. She is expereincing a recurrence of Psoriasis on her left elbow, described as a scaly pruritic rash. She does not want it to spread to her knuckles.     Past Medical History:  Diagnosis Date  . Acid reflux disease   . Asthma   . Bipolar 1 disorder ([place])   . Liver hemangioma   . Lung nodule   . Psoriasis   . Rheumatoid arthritis ([place])   . Tardive dyskinesia   . Wears dentures    full upper and lower     Past Surgical History:  Procedure Laterality Date  . CATARACT EXTRACTION W/ INTRAOCULAR LENS  IMPLANT, BILATERAL    . CERVICAL SPINE SURGERY  2017   [facility] [place]  . COLONOSCOPY WITH PROPOFOL N/A 09/14/2016   Procedure: COLONOSCOPY WITH PROPOFOL;  Surgeon: Lucilla Lame, MD;  Location: [facility] ENDOSCOPY;  Service: Endoscopy;  Laterality: N/A;  . ESOPHAGOGASTRODUODENOSCOPY (EGD) WITH PROPOFOL N/A 09/14/2016   Procedure: ESOPHAGOGASTRODUODENOSCOPY (EGD) WITH PROPOFOL;  Surgeon: Lucilla Lame, MD;  Location: [facility] ENDOSCOPY;  Service: Endoscopy;  Laterality: N/A;  . FLEXIBLE BRONCHOSCOPY N/A 01/13/2016   Procedure: FLEXIBLE BRONCHOSCOPY;  Surgeon: Vilinda Boehringer, MD;  Location: [facility] ORS;  Service: Cardiopulmonary;  Laterality: N/A;  . NEUROPLASTY MAJOR NERVE    . POLYPECTOMY    . THROAT SURGERY      Family History  Problem Relation Age of Onset  . Cancer Mother        Esophageal Ca  . Asthma Brother   . Breast cancer Neg Hx     Social History   Social History  . Marital status: Single    Spouse name: N/A  . Number of children: N/A  . Years of education: N/A   Occupational History  . Not on file.   Social History Main Topics  . Smoking status: Former Smoker    Types: E-cigarettes    Quit date: 09/19/2015  . Smokeless tobacco: Never Used  . Alcohol use No     Comment: 03/31/2012 sobierty   . Drug use: No  . Sexual activity:  No   Other Topics Concern  . Not on file   Social History Narrative  . No narrative on file     Current Outpatient Prescriptions:  .  alendronate (FOSAMAX) 70 MG tablet, Take 1 tablet (70 mg total) by mouth every 7 (seven) days. Take with a full glass of water on an empty stomach., Disp: 4 tablet, Rfl: 11 .  ARIPiprazole (ABILIFY) 10 MG tablet, Take 10 mg by mouth daily., Disp: , Rfl:  .  buPROPion (WELLBUTRIN XL) 150 MG 24 hr tablet, Take 150 mg by mouth daily., Disp: , Rfl:  .  DIAZEPAM PO, Take 2.5 mg by mouth 2 (two) times daily., Disp: , Rfl:   .  esomeprazole (NEXIUM) 40 MG capsule, Take 1 capsule (40 mg total) by mouth daily., Disp: 30 capsule, Rfl: 6 .  gabapentin (NEURONTIN) 300 MG capsule, Take 300 mg by mouth 2 (two) times daily., Disp: , Rfl:  .  metFORMIN (GLUCOPHAGE-XR) 500 MG 24 hr tablet, TAKE 1 TABLET (500 MG TOTAL) BY MOUTH DAILY WITH BREAKFAST., Disp: 30 tablet, Rfl: 0 .  methocarbamol (ROBAXIN) 500 MG tablet, Take 500 mg by mouth 2 (two) times daily., Disp: , Rfl:  .  PROAIR HFA 108 (90 Base) MCG/ACT inhaler, INHALE 1-2 PUFFS INTO THE LUNGS EVERY SIX HOURS AS NEEDED FOR WHEEZING OR SHORTNESS OF BREATH, Disp: 8.5 g, Rfl: 2 .  rosuvastatin (CRESTOR) 10 MG tablet, Take 1 tablet (10 mg total) by mouth at bedtime., Disp: 90 tablet, Rfl: 0 .  STIOLTO RESPIMAT 2.5-2.5 MCG/ACT AERS, INHALE 2 PUFFS INTO THE LUNGS DAILY., Disp: 4 g, Rfl: 5 .  traZODone (DESYREL) 50 MG tablet, Take 50-150 mg by mouth at bedtime. , Disp: , Rfl:  .  VESICARE 10 MG tablet, TAKE 1 TABLET BY MOUTH DAILY, Disp: 90 tablet, Rfl: 1  Allergies  Allergen Reactions  . Codeine Nausea Only  . Haldol [Haloperidol] Other (See Comments)    Reaction: Pt "felt like her neck was going to snap." Sever neck stiffness. Reaction: Pt "felt like her neck was going to snap."  . Haloperidol Lactate Other (See Comments)    Sever neck stiffness.  . Trileptal [Oxcarbazepine] Swelling     Review of Systems  Constitutional: Negative for fatigue.  Respiratory: Negative for shortness of breath.   Musculoskeletal: Negative for myalgias.  Neurological: Negative for headaches.  Endo/Heme/Allergies: Negative for polydipsia.  Psychiatric/Behavioral: The patient is not nervous/anxious.      Objective  Vitals:   05/02/17 1036  BP: 132/82  Pulse: 95  SpO2: 95%  Weight: 194 lb 9.6 oz (88.3 kg)  Height: 5\' 3"  (1.6 m)    Physical Exam  Constitutional: She is oriented to person, place, and time and well-developed, well-nourished, and in no distress.  HENT:  Head:  Normocephalic and atraumatic.  Cardiovascular: Normal rate, regular rhythm and normal heart sounds.   No murmur heard. Pulmonary/Chest: Effort normal and breath sounds normal. She has no wheezes.  Abdominal: Soft. Bowel sounds are normal. There is no tenderness.  Musculoskeletal: She exhibits no edema.  Neurological: She is alert and oriented to person, place, and time.  Skin: Rash noted. Rash is macular.  Macular erythematous scale-like rash on the elbow joints  Psychiatric: Mood, memory, affect and judgment normal.  Nursing note and vitals reviewed.     Assessment & Plan  1. Flu vaccine need  - Flu Vaccine QUAD 36+ mos IM  2. Diabetes mellitus type 2 in obese (HCC) Point-of-care A1c 6.8%, well-controlled  diabetes, continue on metformin - POCT glycosylated hemoglobin (Hb A1C) - metFORMIN (GLUCOPHAGE-XR) 500 MG 24 hr tablet; Take 1 tablet (500 mg total) by mouth daily with breakfast.  Dispense: 90 tablet; Refill: 0  3. Mixed hyperlipidemia LDL above goal, continue on low-dose rosuvastatin, repeat FLP - rosuvastatin (CRESTOR) 10 MG tablet; Take 1 tablet (10 mg total) by mouth at bedtime.  Dispense: 90 tablet; Refill: 0 - Lipid panel - COMPLETE METABOLIC PANEL WITH GFR  4. Psoriasis  - calcipotriene (DOVONOX) 0.005 % ointment; Apply topically 2 (two) times daily.  Dispense: 60 g; Refill: 0   Tanairy Payeur [facility] 05/02/2017 10:47 AM

## 2017-05-03 ENCOUNTER — Telehealth: Payer: Self-pay

## 2017-05-03 NOTE — Telephone Encounter (Signed)
Called pt informed her of information below, pt gave verbal understanding.

## 2017-05-03 NOTE — Telephone Encounter (Signed)
-----   Message from Roselee Nova, MD sent at 05/02/2017  6:34 PM EDT ----- FLP: Normal total cholesterol, triglycerides and LDL cholesterol, HDL below normal at 49 mg per DL. He should continue on simvastatin and recheck in 6 months CMP: Normal serum glucose, electrolytes, kidney function and liver enzymes

## 2017-05-05 ENCOUNTER — Ambulatory Visit: Payer: Self-pay | Admitting: Family Medicine

## 2017-05-17 ENCOUNTER — Other Ambulatory Visit: Payer: Self-pay | Admitting: Family Medicine

## 2017-05-17 DIAGNOSIS — E1169 Type 2 diabetes mellitus with other specified complication: Secondary | ICD-10-CM

## 2017-05-17 DIAGNOSIS — E669 Obesity, unspecified: Principal | ICD-10-CM

## 2017-05-31 ENCOUNTER — Other Ambulatory Visit: Payer: Self-pay | Admitting: Specialist

## 2017-05-31 MED ORDER — TRANEXAMIC ACID 1000 MG/10ML IV SOLN: 1000.0000 mg | INTRAVENOUS | Status: AC

## 2017-06-01 ENCOUNTER — Encounter
Admission: RE | Admit: 2017-06-01 | Discharge: 2017-06-01 | Disposition: A | Discharge status: Home / Self Care | Payer: Medicaid Other | Source: Ambulatory Visit | Attending: Specialist | Admitting: Specialist

## 2017-06-01 ENCOUNTER — Other Ambulatory Visit: Payer: Self-pay

## 2017-06-01 DIAGNOSIS — Z87891 Personal history of nicotine dependence: Secondary | ICD-10-CM | POA: Insufficient documentation

## 2017-06-01 DIAGNOSIS — E119 Type 2 diabetes mellitus without complications: Secondary | ICD-10-CM | POA: Insufficient documentation

## 2017-06-01 DIAGNOSIS — Z9889 Other specified postprocedural states: Secondary | ICD-10-CM | POA: Insufficient documentation

## 2017-06-01 DIAGNOSIS — Z809 Family history of malignant neoplasm, unspecified: Secondary | ICD-10-CM | POA: Insufficient documentation

## 2017-06-01 DIAGNOSIS — L409 Psoriasis, unspecified: Secondary | ICD-10-CM | POA: Insufficient documentation

## 2017-06-01 DIAGNOSIS — Z01812 Encounter for preprocedural laboratory examination: Secondary | ICD-10-CM | POA: Diagnosis present

## 2017-06-01 DIAGNOSIS — Z23 Encounter for immunization: Secondary | ICD-10-CM | POA: Diagnosis not present

## 2017-06-01 DIAGNOSIS — Z7984 Long term (current) use of oral hypoglycemic drugs: Secondary | ICD-10-CM | POA: Diagnosis not present

## 2017-06-01 DIAGNOSIS — E785 Hyperlipidemia, unspecified: Secondary | ICD-10-CM | POA: Insufficient documentation

## 2017-06-01 DIAGNOSIS — Z79899 Other long term (current) drug therapy: Secondary | ICD-10-CM | POA: Diagnosis not present

## 2017-06-01 HISTORY — DX: Type 2 diabetes mellitus without complications: E11.9

## 2017-06-01 LAB — COMPREHENSIVE METABOLIC PANEL
ALBUMIN: 3.9 g/dL (ref 3.5–5.0)
ALT: 16 U/L (ref 14–54)
ANION GAP: 8 (ref 5–15)
AST: 19 U/L (ref 15–41)
Alkaline Phosphatase: 62 U/L (ref 38–126)
BILIRUBIN TOTAL: 0.9 mg/dL (ref 0.3–1.2)
BUN: 12 mg/dL (ref 6–20)
CALCIUM: 8.9 mg/dL (ref 8.9–10.3)
CO2: 27 mmol/L (ref 22–32)
Chloride: 100 mmol/L — ABNORMAL LOW (ref 101–111)
Creatinine, Ser: 0.71 mg/dL (ref 0.44–1.00)
GFR calc Af Amer: >60 mL/min (ref >60)
GFR calc non Af Amer: >60 mL/min (ref >60)
GLUCOSE: 116 mg/dL — AB (ref 65–99)
POTASSIUM: 3.9 mmol/L (ref 3.5–5.1)
SODIUM: 135 mmol/L (ref 135–145)
Total Protein: 8.1 g/dL (ref 6.5–8.1)

## 2017-06-01 LAB — TYPE AND SCREEN
ABO/RH(D): A NEG
ANTIBODY SCREEN: NEGATIVE

## 2017-06-01 LAB — CBC WITH DIFFERENTIAL/PLATELET
BASOS PCT: 1 %
Basophils Absolute: 0.1 10*3/uL (ref 0–0.1)
EOS ABS: 0.3 10*3/uL (ref 0–0.7)
EOS PCT: 3 %
HCT: 39.6 % (ref 35.0–47.0)
Hemoglobin: 12.7 g/dL (ref 12.0–16.0)
Lymphocytes Relative: 32 %
Lymphs Abs: 2.7 10*3/uL (ref 1.0–3.6)
MCH: 26.2 pg (ref 26.0–34.0)
MCHC: 32 g/dL (ref 32.0–36.0)
MCV: 81.9 fL (ref 80.0–100.0)
MONO ABS: 0.4 10*3/uL (ref 0.2–0.9)
MONOS PCT: 5 %
Neutro Abs: 4.9 10*3/uL (ref 1.4–6.5)
Neutrophils Relative %: 59 %
PLATELETS: 259 10*3/uL (ref 150–440)
RBC: 4.83 MIL/uL (ref 3.80–5.20)
RDW: 14.9 % — AB (ref 11.5–14.5)
WBC: 8.3 10*3/uL (ref 3.6–11.0)

## 2017-06-01 LAB — URINALYSIS, ROUTINE W REFLEX MICROSCOPIC
Bilirubin Urine: NEGATIVE
GLUCOSE, UA: NEGATIVE mg/dL
HGB URINE DIPSTICK: NEGATIVE
Ketones, ur: NEGATIVE mg/dL
LEUKOCYTES UA: NEGATIVE
Nitrite: NEGATIVE
PH: 7 (ref 5.0–8.0)
PROTEIN: NEGATIVE mg/dL
Specific Gravity, Urine: 1.005 (ref 1.005–1.030)

## 2017-06-01 LAB — APTT: aPTT: 29 seconds (ref 24–36)

## 2017-06-01 LAB — SURGICAL PCR SCREEN
MRSA, PCR: NEGATIVE
Staphylococcus aureus: POSITIVE — AB

## 2017-06-01 LAB — PROTIME-INR
INR: 1.05
PROTHROMBIN TIME: 13.6 s (ref 11.4–15.2)

## 2017-06-01 NOTE — Patient Instructions (Signed)
Your procedure is scheduled on: Wednesday 06/15/17 Report to [facility]. 2ND FLOOR MEDICAL MALL ENTRANCE. To find out your arrival time please call [phone] between 1PM - 3PM on Tuesday 06/14/17.  Remember: Instructions that are not followed completely may result in serious medical risk, up to and including death, or upon the discretion of your surgeon and anesthesiologist your surgery may need to be rescheduled.    __X__ 1. Do not eat anything after midnight the night before your    procedure.  No gum chewing or hard candies.  You may drink clear   liquids up to 2 hours before you are scheduled to arrive at the   hospital for your procedure. Do not drink clear liquids within 2   hours of scheduled arrival to the hospital as this may lead to your   procedure being delayed or rescheduled.       Clear liquids include:   Water or Apple juice without pulp   Clear carbohydrate beverage such as Clearfast or Gatorade   Black coffee or Clear Tea (no milk, no creamer, do not add anything   to the coffee or tea)    Diabetics should only drink water   __X__ 2. No Alcohol for 24 hours before or after surgery.   ____ 3. Bring all medications with you on the day of surgery if instructed.    __X__ 4. Notify your doctor if there is any change in your medical condition     (cold, fever, infections).             __X___5. No smoking within 24 hours of your surgery.     Do not wear jewelry, make-up, hairpins, clips or nail polish.  Do not wear lotions, powders, or perfumes.   Do not shave 48 hours prior to surgery. Men may shave face and neck.  Do not bring valuables to the hospital.    [facility] is not responsible for any belongings or valuables.               Contacts, dentures or bridgework may not be worn into surgery.  Leave your suitcase in the car. After surgery it may be brought to your room.  For patients admitted to the hospital, discharge time is determined by your                 treatment team.   Patients discharged the day of surgery will not be allowed to drive home.   Please read over the following fact sheets that you were given:   MRSA Information   __X__ Take these medicines the morning of surgery with A SIP OF WATER:    1. ABILIFY  2. WELLBUTRIN  3. VALIUM  4. [place]  5. VESICARE  6.  ____ Fleet Enema (as directed)   __X__ Use CHG Soap/SAGE wipes as directed  __X__ Use inhalers on the day of surgery  __X__ Stop metformin 2 days prior to surgery    ____ Take 1/2 of usual insulin dose the night before surgery and none on the morning of surgery.   ____ Stop Coumadin/Plavix/aspirin on   __X__ Stop Anti-inflammatories such as Advil, Aleve, Ibuprofen, Motrin, Naproxen, Naprosyn, Goodies,powder, or aspirin products.  OK to take Tylenol.   __X__ Stop supplements, Vitamin E, Fish Oil until after surgery.    ____ Bring C-Pap to the hospital.

## 2017-06-02 ENCOUNTER — Ambulatory Visit: Payer: Self-pay | Admitting: Family Medicine

## 2017-06-02 ENCOUNTER — Telehealth: Payer: Self-pay | Admitting: Internal Medicine

## 2017-06-02 LAB — HEMOGLOBIN A1C
HEMOGLOBIN A1C: 6.6 % — AB (ref 4.8–5.6)
MEAN PLASMA GLUCOSE: 142.72 mg/dL

## 2017-06-02 MED ORDER — ALBUTEROL SULFATE HFA 108 (90 BASE) MCG/ACT IN AERS: 2.0000 | INHALATION_SPRAY | RESPIRATORY_TRACT | 2 refills | Status: DC | PRN

## 2017-06-02 NOTE — Telephone Encounter (Signed)
Refill submitted per patient request.  ?

## 2017-06-02 NOTE — Telephone Encounter (Signed)
°*  STAT* If patient is at the pharmacy, call can be transferred to refill team.   1. Which medications need to be refilled? (please list name of each medication and dose if known) PROAIR HFA 108 (90 Base)  2. Which pharmacy/location (including street and city if local pharmacy) is medication to be sent to? [facility] on [address] in [place]   3. Do they need a 30 day or 90 day supply?

## 2017-06-06 ENCOUNTER — Ambulatory Visit: Payer: Self-pay | Admitting: Family Medicine

## 2017-06-08 ENCOUNTER — Ambulatory Visit: Payer: Medicaid Other | Admitting: Family Medicine

## 2017-06-08 ENCOUNTER — Encounter: Payer: Self-pay | Admitting: Family Medicine

## 2017-06-08 DIAGNOSIS — K219 Gastro-esophageal reflux disease without esophagitis: Secondary | ICD-10-CM | POA: Diagnosis not present

## 2017-06-08 MED ORDER — ESOMEPRAZOLE MAGNESIUM 40 MG PO CPDR: 40.0000 mg | DELAYED_RELEASE_CAPSULE | Freq: Every day | ORAL | 0 refills | Status: DC

## 2017-06-08 NOTE — Progress Notes (Signed)
Name: Kathryn Ramirez   MRN: [id number]    DOB: 08-25-1950   Date:06/08/2017       Progress Note  Subjective  Chief Complaint  Chief Complaint  Patient presents with  . Medication Refill    nexum  . Hyperlipidemia    1 mnth f/u  . Diabetes    64monthf/u    Gastroesophageal Reflux  She complains of coughing, heartburn and nausea. She reports no abdominal pain, no belching or no sore throat. This is a recurrent problem. The symptoms are aggravated by certain foods (hot spicy foods bring on heartburn). Risk factors include obesity. She has tried an antacid and a PPI for the symptoms. Past procedures include an EGD.  Reviewed endoscopy report from February 2018  Past Medical History:  Diagnosis Date  . Acid reflux disease   . Asthma   . Bipolar 1 disorder ([place]   . Diabetes mellitus without complication ([place]   . Hypertension   . Liver hemangioma   . Lung nodule   . Psoriasis   . Rheumatoid arthritis ([place]   . Tardive dyskinesia   . Wears dentures    full upper and lower    Past Surgical History:  Procedure Laterality Date  . CATARACT EXTRACTION W/ INTRAOCULAR LENS  IMPLANT, BILATERAL    . [facility]  [facility] [place] [facility] NEUROPLAST[facility]JOR NERVE    . POLYPECTOMY    . THROAT SURGERY      Family History  Problem Relation Age of Onset  . Cancer Mother        Esophageal Ca  . Asthma Brother   . Breast cancer Neg Hx     Social History   Socioeconomic History  . Marital status: Single    Spouse name: Not on file  . Number of children: Not on file  . Years of education: Not on file  . Highest education level: Not on file  Social Needs  . Financial resource strain: Not on file  . Food insecurity - worry: Not on file  . Food insecurity - inability: Not on file  . Transportation needs - medical: Not on file  . Transportation needs - non-medical: Not on file  Occupational History  . Not on file  Tobacco Use  . Smoking status: Former Smoker     Types: E-cigarettes    Last attempt to quit: 09/19/2015    Years since quitting: 1.7  . Smokeless tobacco: Never Used  Substance and Sexual Activity  . Alcohol use: No    Alcohol/week: 0.0 oz    Comment: 03/31/2012 sobierty   . Drug use: No  . Sexual activity: No  Other Topics Concern  . Not on file  Social History Narrative  . Not on file     Current Outpatient Medications:  .  acetaminophen (TYLENOL) 500 MG tablet, Take 500 mg by mouth daily as needed for mild pain or moderate pain., Disp: , Rfl:  .  albuterol (PROAIR HFA) 108 (90 Base) MCG/ACT inhaler, Inhale 2 puffs every 4 (four) hours as needed into the lungs for wheezing or shortness of breath., Disp: 8.5 g, Rfl: 2 .  alendronate (FOSAMAX) 70 MG tablet, Take 1 tablet (70 mg total) by mouth every 7 (seven) days. Take with a full glass of water on an empty stomach. (Patient taking differently: Take 70 mg by mouth every 7 (seven) days. Take with a full glass of water on an empty stomach. Either on Saturday  or Sunday), Disp: 4 tablet, Rfl: 11 .  ARIPiprazole (ABILIFY) 10 MG tablet, Take 10 mg by mouth daily., Disp: , Rfl:  .  buPROPion (WELLBUTRIN XL) 150 MG 24 hr tablet, Take 150 mg by mouth daily., Disp: , Rfl:  .  calcipotriene (DOVONOX) 0.005 % ointment, Apply topically 2 (two) times daily. (Patient taking differently: Apply 1 application topically 2 (two) times daily as needed (psoriasis). ), Disp: 60 g, Rfl: 0 .  diazepam (VALIUM) 5 MG tablet, Take 2.5 mg by mouth 3 (three) times daily., Disp: , Rfl:  .  diclofenac sodium (VOLTAREN) 1 % GEL, Apply 2 g topically 2 (two) times daily as needed (pain)., Disp: , Rfl:  .  esomeprazole (NEXIUM) 40 MG capsule, Take 1 capsule (40 mg total) by mouth daily., Disp: 30 capsule, Rfl: 6 .  gabapentin (NEURONTIN) 300 MG capsule, Take 300 mg by mouth 2 (two) times daily., Disp: , Rfl:  .  metFORMIN (GLUCOPHAGE-XR) 500 MG 24 hr tablet, Take 1 tablet (500 mg total) by mouth daily with breakfast.,  Disp: 90 tablet, Rfl: 0 .  methocarbamol (ROBAXIN) 500 MG tablet, Take 500 mg by mouth 2 (two) times daily., Disp: , Rfl:  .  rosuvastatin (CRESTOR) 10 MG tablet, Take 1 tablet (10 mg total) by mouth at bedtime., Disp: 90 tablet, Rfl: 0 .  STIOLTO RESPIMAT 2.5-2.5 MCG/ACT AERS, INHALE 2 PUFFS INTO THE LUNGS DAILY., Disp: 4 g, Rfl: 5 .  traZODone (DESYREL) 50 MG tablet, Take 50 mg 2 (two) times daily by mouth. , Disp: , Rfl:  .  VESICARE 10 MG tablet, TAKE 1 TABLET BY MOUTH DAILY, Disp: 90 tablet, Rfl: 1  Allergies  Allergen Reactions  . Codeine Nausea Only  . Haldol [Haloperidol] Other (See Comments)    Reaction: Pt "felt like her neck was going to snap." Sever neck stiffness. Reaction: Pt "felt like her neck was going to snap."  . Haloperidol Lactate Other (See Comments)    Sever neck stiffness.  . Trileptal [Oxcarbazepine] Swelling     Review of Systems  HENT: Negative for sore throat.   Respiratory: Positive for cough.   Gastrointestinal: Positive for heartburn and nausea. Negative for abdominal pain.    Objective  Vitals:   06/08/17 1103  BP: 114/76  Pulse: 94  Resp: 16  Temp: 98.1 F (36.7 C)  TempSrc: Oral  SpO2: 96%  Weight: 195 lb 11.2 oz (88.8 kg)    Physical Exam  Constitutional: She is oriented to person, place, and time and well-developed, well-nourished, and in no distress.  HENT:  Head: Normocephalic and atraumatic.  Cardiovascular: Normal rate, regular rhythm and normal heart sounds.  No murmur heard. Pulmonary/Chest: Effort normal and breath sounds normal.  Neurological: She is alert and oriented to person, place, and time.  Nursing note and vitals reviewed.    Recent Results (from the past 2160 hour(s))  POCT glycosylated hemoglobin (Hb A1C)     Status: Normal   Collection Time: 05/02/17 10:37 AM  Result Value Ref Range   Hemoglobin A1C 6.8   Lipid panel     Status: Abnormal   Collection Time: 05/02/17 11:15 AM  Result Value Ref Range    Cholesterol 126 <200 mg/dL   HDL 49 (L) >50 mg/dL   Triglycerides 98 <150 mg/dL   LDL Cholesterol (Calc) 59 mg/dL (calc)    Comment: Reference range: <100 . Desirable range <100 mg/dL for primary prevention;   <70 mg/dL for patients with CHD or diabetic patients  with > or = 2 CHD risk factors. [name] LDL-C is now calculated using the Martin-Hopkins  calculation, which is a validated novel method providing  better accuracy than the Friedewald equation in the  estimation of LDL-C.  Cresenciano Genre et al. Annamaria Helling. 0459;977(41): 2061-2068  (http://education.[facility].com/faq/FAQ164)    Total CHOL/HDL Ratio 2.6 <5.0 (calc)   Non-HDL Cholesterol (Calc) 77 <130 mg/dL (calc)    Comment: For patients with diabetes plus 1 major ASCVD risk  factor, treating to a non-HDL-C goal of <100 mg/dL  (LDL-C of <70 mg/dL) is considered a therapeutic  option.   COMPLETE METABOLIC PANEL WITH GFR     Status: None   Collection Time: 05/02/17 11:15 AM  Result Value Ref Range   Glucose, Bld 99 65 - 99 mg/dL    Comment: .            Fasting reference interval .    BUN 10 7 - 25 mg/dL   Creat 0.69 0.50 - 0.99 mg/dL    Comment: For patients >39 years of age, the reference limit for Creatinine is approximately 13% higher for people identified as African-American. .    GFR, Est Non African American 92 > OR = 60 mL/min/1.7m   GFR, Est African American 107 > OR = 60 mL/min/1.755m  BUN/Creatinine Ratio NOT APPLICABLE 6 - 22 (calc)   Sodium 135 135 - 146 mmol/L   Potassium 4.3 3.5 - 5.3 mmol/L   Chloride 98 98 - 110 mmol/L   CO2 29 20 - 32 mmol/L   Calcium 9.1 8.6 - 10.4 mg/dL   Total Protein 6.8 6.1 - 8.1 g/dL   Albumin 3.8 3.6 - 5.1 g/dL   Globulin 3.0 1.9 - 3.7 g/dL (calc)   AG Ratio 1.3 1.0 - 2.5 (calc)   Total Bilirubin 0.3 0.2 - 1.2 mg/dL   Alkaline phosphatase (APISO) 59 33 - 130 U/L   AST 12 10 - 35 U/L   ALT 12 6 - 29 U/L  APTT     Status: None   Collection Time: 06/01/17 10:24 AM  Result  Value Ref Range   aPTT 29 24 - 36 seconds  CBC WITH DIFFERENTIAL     Status: Abnormal   Collection Time: 06/01/17 10:24 AM  Result Value Ref Range   WBC 8.3 3.6 - 11.0 K/uL   RBC 4.83 3.80 - 5.20 MIL/uL   Hemoglobin 12.7 12.0 - 16.0 g/dL   HCT 39.6 35.0 - 47.0 %   MCV 81.9 80.0 - 100.0 fL   MCH 26.2 26.0 - 34.0 pg   MCHC 32.0 32.0 - 36.0 g/dL   RDW 14.9 (H) 11.5 - 14.5 %   Platelets 259 150 - 440 K/uL   Neutrophils Relative % 59 %   Neutro Abs 4.9 1.4 - 6.5 K/uL   Lymphocytes Relative 32 %   Lymphs Abs 2.7 1.0 - 3.6 K/uL   Monocytes Relative 5 %   Monocytes Absolute 0.4 0.2 - 0.9 K/uL   Eosinophils Relative 3 %   Eosinophils Absolute 0.3 0 - 0.7 K/uL   Basophils Relative 1 %   Basophils Absolute 0.1 0 - 0.1 K/uL  Comprehensive metabolic panel     Status: Abnormal   Collection Time: 06/01/17 10:24 AM  Result Value Ref Range   Sodium 135 135 - 145 mmol/L   Potassium 3.9 3.5 - 5.1 mmol/L   Chloride 100 (L) 101 - 111 mmol/L   CO2 27 22 - 32 mmol/L   Glucose,  Bld 116 (H) 65 - 99 mg/dL   BUN 12 6 - 20 mg/dL   Creatinine, Ser 0.71 0.44 - 1.00 mg/dL   Calcium 8.9 8.9 - 10.3 mg/dL   Total Protein 8.1 6.5 - 8.1 g/dL   Albumin 3.9 3.5 - 5.0 g/dL   AST 19 15 - 41 U/L   ALT 16 14 - 54 U/L   Alkaline Phosphatase 62 38 - 126 U/L   Total Bilirubin 0.9 0.3 - 1.2 mg/dL   GFR calc non Af Amer >60 >60 mL/min   GFR calc Af Amer >60 >60 mL/min    Comment: (NOTE) The eGFR has been calculated using the CKD EPI equation. This calculation has not been validated in all clinical situations. eGFR's persistently <60 mL/min signify possible Chronic Kidney Disease.    Anion gap 8 5 - 15  Hemoglobin A1c     Status: Abnormal   Collection Time: 06/01/17 10:24 AM  Result Value Ref Range   Hgb A1c MFr Bld 6.6 (H) 4.8 - 5.6 %    Comment: (NOTE) Pre diabetes:          5.7%-6.4% Diabetes:              >6.4% Glycemic control for   <7.0% adults with diabetes    Mean Plasma Glucose 142.72 mg/dL     Comment: Performed at [facility] [address], [place], [place] [address]  Protime-INR     Status: None   Collection Time: 06/01/17 10:24 AM  Result Value Ref Range   Prothrombin Time 13.6 11.4 - 15.2 seconds   INR 1.05   Urinalysis, Routine w reflex microscopic     Status: Abnormal   Collection Time: 06/01/17 10:24 AM  Result Value Ref Range   Color, Urine STRAW (A) YELLOW   APPearance CLEAR (A) CLEAR   Specific Gravity, Urine 1.005 1.005 - 1.030   pH 7.0 5.0 - 8.0   Glucose, UA NEGATIVE NEGATIVE mg/dL   Hgb urine dipstick NEGATIVE NEGATIVE   Bilirubin Urine NEGATIVE NEGATIVE   Ketones, ur NEGATIVE NEGATIVE mg/dL   Protein, ur NEGATIVE NEGATIVE mg/dL   Nitrite NEGATIVE NEGATIVE   Leukocytes, UA NEGATIVE NEGATIVE  Surgical pcr screen     Status: Abnormal   Collection Time: 06/01/17 10:24 AM  Result Value Ref Range   MRSA, PCR NEGATIVE NEGATIVE   Staphylococcus aureus POSITIVE (A) NEGATIVE    Comment: (NOTE) The Xpert SA Assay (FDA approved for NASAL specimens in patients 63 years of age and older), is one component of a comprehensive surveillance program. It is not intended to diagnose infection nor to guide or monitor treatment.   Type and screen     Status: None   Collection Time: 06/01/17 10:24 AM  Result Value Ref Range   ABO/RH(D) A NEG    Antibody Screen NEG    Sample Expiration 06/15/2017    Extend sample reason NO TRANSFUSIONS OR PREGNANCY IN THE PAST 3 MONTHS      Assessment & Plan  1. Gastroesophageal reflux disease, esophagitis presence not specified Refill for Nexium provided, advised to consult with gastroenterologist if she has breakthrough symptoms - esomeprazole (NEXIUM) 40 MG capsule; Take 1 capsule (40 mg total) daily by mouth.  Dispense: 30 capsule; Refill: 0    Sergio Zawislak Asad A. [facility] 06/08/2017 11:18 AM

## 2017-06-14 MED ORDER — VANCOMYCIN HCL IN DEXTROSE 1-5 GM/200ML-% IV SOLN
1000.0000 mg | INTRAVENOUS | Status: AC
  Administered 2017-06-15: 1000 mg via INTRAVENOUS

## 2017-06-14 MED ORDER — CEFAZOLIN SODIUM-DEXTROSE 2-4 GM/100ML-% IV SOLN
2.0000 g | INTRAVENOUS | Status: AC
  Administered 2017-06-15: 2 g via INTRAVENOUS

## 2017-06-15 ENCOUNTER — Inpatient Hospital Stay
Admission: RE | Admit: 2017-06-15 | Discharge: 2017-06-17 | DRG: 470 | Disposition: A | Discharge status: Home Health | Payer: Medicaid Other | Attending: Specialist | Admitting: Specialist

## 2017-06-15 ENCOUNTER — Other Ambulatory Visit: Payer: Self-pay

## 2017-06-15 ENCOUNTER — Inpatient Hospital Stay: Payer: Medicaid Other

## 2017-06-15 ENCOUNTER — Encounter
Admission: RE | Disposition: A | Discharge status: Home Health | Payer: Self-pay | Source: Home / Self Care | Attending: Specialist

## 2017-06-15 ENCOUNTER — Inpatient Hospital Stay: Payer: Medicaid Other | Admitting: Certified Registered Nurse Anesthetist

## 2017-06-15 DIAGNOSIS — L409 Psoriasis, unspecified: Secondary | ICD-10-CM | POA: Diagnosis present

## 2017-06-15 DIAGNOSIS — M25761 Osteophyte, right knee: Secondary | ICD-10-CM | POA: Diagnosis present

## 2017-06-15 DIAGNOSIS — K219 Gastro-esophageal reflux disease without esophagitis: Secondary | ICD-10-CM | POA: Diagnosis present

## 2017-06-15 DIAGNOSIS — Z87891 Personal history of nicotine dependence: Secondary | ICD-10-CM

## 2017-06-15 DIAGNOSIS — F319 Bipolar disorder, unspecified: Secondary | ICD-10-CM | POA: Diagnosis present

## 2017-06-15 DIAGNOSIS — J45909 Unspecified asthma, uncomplicated: Secondary | ICD-10-CM | POA: Diagnosis present

## 2017-06-15 DIAGNOSIS — M25561 Pain in right knee: Secondary | ICD-10-CM | POA: Diagnosis present

## 2017-06-15 DIAGNOSIS — Z96659 Presence of unspecified artificial knee joint: Secondary | ICD-10-CM

## 2017-06-15 DIAGNOSIS — Z79899 Other long term (current) drug therapy: Secondary | ICD-10-CM

## 2017-06-15 DIAGNOSIS — E119 Type 2 diabetes mellitus without complications: Secondary | ICD-10-CM | POA: Diagnosis present

## 2017-06-15 DIAGNOSIS — M1711 Unilateral primary osteoarthritis, right knee: Secondary | ICD-10-CM | POA: Diagnosis present

## 2017-06-15 DIAGNOSIS — Z888 Allergy status to other drugs, medicaments and biological substances status: Secondary | ICD-10-CM

## 2017-06-15 DIAGNOSIS — I10 Essential (primary) hypertension: Secondary | ICD-10-CM | POA: Diagnosis present

## 2017-06-15 DIAGNOSIS — E669 Obesity, unspecified: Secondary | ICD-10-CM | POA: Diagnosis present

## 2017-06-15 DIAGNOSIS — Z6834 Body mass index (BMI) 34.0-34.9, adult: Secondary | ICD-10-CM | POA: Diagnosis not present

## 2017-06-15 DIAGNOSIS — M069 Rheumatoid arthritis, unspecified: Secondary | ICD-10-CM | POA: Diagnosis present

## 2017-06-15 HISTORY — PX: TOTAL KNEE ARTHROPLASTY: SHX125

## 2017-06-15 LAB — CBC
HEMATOCRIT: 38.1 % (ref 35.0–47.0)
Hemoglobin: 12.3 g/dL (ref 12.0–16.0)
MCH: 26.8 pg (ref 26.0–34.0)
MCHC: 32.4 g/dL (ref 32.0–36.0)
MCV: 82.8 fL (ref 80.0–100.0)
Platelets: 251 10*3/uL (ref 150–440)
RBC: 4.6 MIL/uL (ref 3.80–5.20)
RDW: 14.9 % — AB (ref 11.5–14.5)
WBC: 10.1 10*3/uL (ref 3.6–11.0)

## 2017-06-15 LAB — ABO/RH: ABO/RH(D): A NEG

## 2017-06-15 LAB — CREATININE, SERUM
Creatinine, Ser: 0.65 mg/dL (ref 0.44–1.00)
GFR calc Af Amer: >60 mL/min (ref >60)

## 2017-06-15 LAB — GLUCOSE, CAPILLARY
GLUCOSE-CAPILLARY: 109 mg/dL — AB (ref 65–99)
Glucose-Capillary: 116 mg/dL — ABNORMAL HIGH (ref 65–99)

## 2017-06-15 SURGERY — ARTHROPLASTY, KNEE, TOTAL
Anesthesia: Spinal | Site: Knee | Laterality: Right | Wound class: Clean

## 2017-06-15 MED ORDER — ACETAMINOPHEN 10 MG/ML IV SOLN
INTRAVENOUS | Status: AC
  Filled 2017-06-15: qty 100

## 2017-06-15 MED ORDER — ARIPIPRAZOLE 10 MG PO TABS
10.0000 mg | ORAL_TABLET | Freq: Every day | ORAL | Status: DC
  Administered 2017-06-16 – 2017-06-17 (×2): 10 mg via ORAL
  Filled 2017-06-15 (×3): qty 1

## 2017-06-15 MED ORDER — SODIUM CHLORIDE 0.45 % IV SOLN
INTRAVENOUS | Status: DC
  Administered 2017-06-15 – 2017-06-16 (×2): via INTRAVENOUS

## 2017-06-15 MED ORDER — ROSUVASTATIN CALCIUM 10 MG PO TABS
10.0000 mg | ORAL_TABLET | Freq: Every day | ORAL | Status: DC
  Administered 2017-06-15 – 2017-06-16 (×2): 10 mg via ORAL
  Filled 2017-06-15 (×2): qty 1

## 2017-06-15 MED ORDER — HYDROCODONE-ACETAMINOPHEN 7.5-325 MG PO TABS
1.0000 | ORAL_TABLET | ORAL | Status: DC | PRN
  Administered 2017-06-15 – 2017-06-17 (×5): 1 via ORAL
  Filled 2017-06-15 (×5): qty 1

## 2017-06-15 MED ORDER — METHOCARBAMOL 500 MG PO TABS
500.0000 mg | ORAL_TABLET | Freq: Two times a day (BID) | ORAL | Status: DC
  Administered 2017-06-15 – 2017-06-17 (×4): 500 mg via ORAL
  Filled 2017-06-15 (×4): qty 1

## 2017-06-15 MED ORDER — VANCOMYCIN HCL IN DEXTROSE 1-5 GM/200ML-% IV SOLN
1000.0000 mg | Freq: Two times a day (BID) | INTRAVENOUS | Status: AC
  Administered 2017-06-15 – 2017-06-16 (×3): 1000 mg via INTRAVENOUS
  Filled 2017-06-15 (×3): qty 200

## 2017-06-15 MED ORDER — FENTANYL CITRATE (PF) 100 MCG/2ML IJ SOLN
INTRAMUSCULAR | Status: AC
  Filled 2017-06-15: qty 2

## 2017-06-15 MED ORDER — MORPHINE SULFATE (PF) 4 MG/ML IV SOLN
INTRAVENOUS | Status: AC
  Filled 2017-06-15: qty 1

## 2017-06-15 MED ORDER — MORPHINE SULFATE (PF) 4 MG/ML IV SOLN
INTRAVENOUS | Status: DC | PRN
  Administered 2017-06-15: 4 mg via INTRAVENOUS

## 2017-06-15 MED ORDER — MELOXICAM 7.5 MG PO TABS
ORAL_TABLET | ORAL | Status: AC
  Administered 2017-06-15: 15 mg via ORAL
  Filled 2017-06-15: qty 2

## 2017-06-15 MED ORDER — SODIUM CHLORIDE 0.9 % IJ SOLN
INTRAMUSCULAR | Status: AC
  Filled 2017-06-15: qty 50

## 2017-06-15 MED ORDER — DIPHENHYDRAMINE HCL 12.5 MG/5ML PO ELIX: 12.5000 mg | ORAL_SOLUTION | ORAL | Status: DC | PRN

## 2017-06-15 MED ORDER — MIDAZOLAM HCL 2 MG/2ML IJ SOLN
INTRAMUSCULAR | Status: AC
  Filled 2017-06-15: qty 2

## 2017-06-15 MED ORDER — NEOMYCIN-POLYMYXIN B GU 40-200000 IR SOLN
Status: DC | PRN
Start: 2017-06-15 — End: 2017-06-15
  Administered 2017-06-15: 16 mL

## 2017-06-15 MED ORDER — DARIFENACIN HYDROBROMIDE ER 7.5 MG PO TB24
7.5000 mg | ORAL_TABLET | Freq: Every day | ORAL | Status: DC
  Administered 2017-06-16 – 2017-06-17 (×2): 7.5 mg via ORAL
  Filled 2017-06-15 (×3): qty 1

## 2017-06-15 MED ORDER — GABAPENTIN 400 MG PO CAPS
ORAL_CAPSULE | ORAL | Status: AC
  Administered 2017-06-15: 400 mg via ORAL
  Filled 2017-06-15: qty 1

## 2017-06-15 MED ORDER — DIAZEPAM 5 MG PO TABS
2.5000 mg | ORAL_TABLET | Freq: Three times a day (TID) | ORAL | Status: DC
  Administered 2017-06-15 – 2017-06-17 (×6): 2.5 mg via ORAL
  Filled 2017-06-15 (×6): qty 1

## 2017-06-15 MED ORDER — PROPOFOL 500 MG/50ML IV EMUL
INTRAVENOUS | Status: AC
  Filled 2017-06-15: qty 50

## 2017-06-15 MED ORDER — PROPOFOL 10 MG/ML IV BOLUS
INTRAVENOUS | Status: DC | PRN
  Administered 2017-06-15 (×2): 18 mg via INTRAVENOUS

## 2017-06-15 MED ORDER — SODIUM CHLORIDE 0.9 % IV SOLN
INTRAVENOUS | Status: DC
  Administered 2017-06-15: 10:00:00 via INTRAVENOUS
  Administered 2017-06-15: 1000 mL via INTRAVENOUS

## 2017-06-15 MED ORDER — ALUM & MAG HYDROXIDE-SIMETH 200-200-20 MG/5ML PO SUSP: 30.0000 mL | ORAL | Status: DC | PRN

## 2017-06-15 MED ORDER — MEPERIDINE HCL 50 MG/ML IJ SOLN: 6.2500 mg | INTRAMUSCULAR | Status: DC | PRN

## 2017-06-15 MED ORDER — ALBUTEROL SULFATE (2.5 MG/3ML) 0.083% IN NEBU: 3.0000 mL | INHALATION_SOLUTION | RESPIRATORY_TRACT | Status: DC | PRN

## 2017-06-15 MED ORDER — FENTANYL CITRATE (PF) 100 MCG/2ML IJ SOLN
INTRAMUSCULAR | Status: DC | PRN
  Administered 2017-06-15: 50 ug via INTRAVENOUS

## 2017-06-15 MED ORDER — CEFAZOLIN SODIUM-DEXTROSE 2-4 GM/100ML-% IV SOLN
INTRAVENOUS | Status: AC
  Filled 2017-06-15: qty 100

## 2017-06-15 MED ORDER — SODIUM CHLORIDE 0.9 % IV SOLN
INTRAVENOUS | Status: DC | PRN
  Administered 2017-06-15: 1000 mg via INTRAVENOUS

## 2017-06-15 MED ORDER — TRAZODONE HCL 50 MG PO TABS
50.0000 mg | ORAL_TABLET | Freq: Two times a day (BID) | ORAL | Status: DC
  Administered 2017-06-15 – 2017-06-16 (×2): 50 mg via ORAL
  Filled 2017-06-15 (×3): qty 1

## 2017-06-15 MED ORDER — LIDOCAINE HCL (PF) 2 % IJ SOLN
INTRAMUSCULAR | Status: AC
  Filled 2017-06-15: qty 10

## 2017-06-15 MED ORDER — METFORMIN HCL ER 500 MG PO TB24
500.0000 mg | ORAL_TABLET | Freq: Every day | ORAL | Status: DC
Start: 2017-06-16 — End: 2017-06-17
  Administered 2017-06-16 – 2017-06-17 (×2): 500 mg via ORAL
  Filled 2017-06-15 (×2): qty 1

## 2017-06-15 MED ORDER — ACETAMINOPHEN 325 MG PO TABS
650.0000 mg | ORAL_TABLET | ORAL | Status: DC | PRN
  Filled 2017-06-15: qty 2

## 2017-06-15 MED ORDER — BUPIVACAINE LIPOSOME 1.3 % IJ SUSP
INTRAMUSCULAR | Status: AC
  Filled 2017-06-15: qty 20

## 2017-06-15 MED ORDER — ACETAMINOPHEN 500 MG PO TABS
500.0000 mg | ORAL_TABLET | Freq: Every day | ORAL | Status: DC | PRN
  Administered 2017-06-16: 500 mg via ORAL
  Filled 2017-06-15: qty 1

## 2017-06-15 MED ORDER — PROPOFOL 500 MG/50ML IV EMUL
INTRAVENOUS | Status: DC | PRN
  Administered 2017-06-15: 70 ug/kg/min via INTRAVENOUS

## 2017-06-15 MED ORDER — BISACODYL 10 MG RE SUPP
10.0000 mg | Freq: Every day | RECTAL | Status: DC | PRN
  Administered 2017-06-17: 10 mg via RECTAL
  Filled 2017-06-15: qty 1

## 2017-06-15 MED ORDER — TRANEXAMIC ACID 1000 MG/10ML IV SOLN
1000.0000 mg | Freq: Once | INTRAVENOUS | Status: AC
  Administered 2017-06-15: 1000 mg via INTRAVENOUS
  Filled 2017-06-15: qty 10

## 2017-06-15 MED ORDER — METOCLOPRAMIDE HCL 10 MG PO TABS: 5.0000 mg | ORAL_TABLET | Freq: Three times a day (TID) | ORAL | Status: DC | PRN

## 2017-06-15 MED ORDER — FENTANYL CITRATE (PF) 100 MCG/2ML IJ SOLN: 25.0000 ug | INTRAMUSCULAR | Status: DC | PRN

## 2017-06-15 MED ORDER — MELOXICAM 7.5 MG PO TABS
15.0000 mg | ORAL_TABLET | Freq: Once | ORAL | Status: AC
  Administered 2017-06-15: 15 mg via ORAL

## 2017-06-15 MED ORDER — PHENOL 1.4 % MT LIQD
1.0000 | OROMUCOSAL | Status: DC | PRN
  Filled 2017-06-15: qty 177

## 2017-06-15 MED ORDER — MORPHINE SULFATE (PF) 2 MG/ML IV SOLN
1.0000 mg | INTRAVENOUS | Status: DC | PRN
  Administered 2017-06-16: 1 mg via INTRAVENOUS
  Filled 2017-06-15: qty 1

## 2017-06-15 MED ORDER — ONDANSETRON HCL 4 MG/2ML IJ SOLN: 4.0000 mg | Freq: Once | INTRAMUSCULAR | Status: DC | PRN

## 2017-06-15 MED ORDER — PANTOPRAZOLE SODIUM 40 MG PO TBEC
40.0000 mg | DELAYED_RELEASE_TABLET | Freq: Every day | ORAL | Status: DC
  Administered 2017-06-16 – 2017-06-17 (×2): 40 mg via ORAL
  Filled 2017-06-15 (×2): qty 1

## 2017-06-15 MED ORDER — PHENYLEPHRINE HCL 10 MG/ML IJ SOLN
INTRAMUSCULAR | Status: AC
  Filled 2017-06-15: qty 1

## 2017-06-15 MED ORDER — GABAPENTIN 400 MG PO CAPS
400.0000 mg | ORAL_CAPSULE | ORAL | Status: AC
  Administered 2017-06-15: 400 mg via ORAL

## 2017-06-15 MED ORDER — FLEET ENEMA 7-19 GM/118ML RE ENEM: 1.0000 | ENEMA | Freq: Once | RECTAL | Status: DC | PRN

## 2017-06-15 MED ORDER — ACETAMINOPHEN 500 MG PO TABS
1000.0000 mg | ORAL_TABLET | Freq: Four times a day (QID) | ORAL | Status: AC | PRN
  Filled 2017-06-15: qty 2

## 2017-06-15 MED ORDER — BUPROPION HCL ER (XL) 150 MG PO TB24
150.0000 mg | ORAL_TABLET | Freq: Every day | ORAL | Status: DC
Start: 2017-06-16 — End: 2017-06-17
  Administered 2017-06-16 – 2017-06-17 (×2): 150 mg via ORAL
  Filled 2017-06-15 (×2): qty 1

## 2017-06-15 MED ORDER — GABAPENTIN 300 MG PO CAPS: 300.0000 mg | ORAL_CAPSULE | Freq: Two times a day (BID) | ORAL | Status: DC

## 2017-06-15 MED ORDER — SODIUM CHLORIDE 0.9 % IV SOLN
INTRAVENOUS | Status: DC | PRN
  Administered 2017-06-15: 30 ug/min via INTRAVENOUS

## 2017-06-15 MED ORDER — METOCLOPRAMIDE HCL 5 MG/ML IJ SOLN: 5.0000 mg | Freq: Three times a day (TID) | INTRAMUSCULAR | Status: DC | PRN

## 2017-06-15 MED ORDER — PHENYLEPHRINE HCL 10 MG/ML IJ SOLN
INTRAMUSCULAR | Status: DC | PRN
  Administered 2017-06-15: 100 ug via INTRAVENOUS

## 2017-06-15 MED ORDER — ONDANSETRON HCL 4 MG PO TABS
4.0000 mg | ORAL_TABLET | Freq: Four times a day (QID) | ORAL | Status: DC | PRN
  Administered 2017-06-16 – 2017-06-17 (×2): 4 mg via ORAL
  Filled 2017-06-15 (×2): qty 1

## 2017-06-15 MED ORDER — KETOROLAC TROMETHAMINE 30 MG/ML IJ SOLN
INTRAMUSCULAR | Status: DC | PRN
  Administered 2017-06-15: 30 mg via INTRAVENOUS

## 2017-06-15 MED ORDER — ACETAMINOPHEN 10 MG/ML IV SOLN
INTRAVENOUS | Status: DC | PRN
  Administered 2017-06-15: 1000 mg via INTRAVENOUS

## 2017-06-15 MED ORDER — MENTHOL 3 MG MT LOZG
1.0000 | LOZENGE | OROMUCOSAL | Status: DC | PRN
  Filled 2017-06-15: qty 9

## 2017-06-15 MED ORDER — CELECOXIB 200 MG PO CAPS
200.0000 mg | ORAL_CAPSULE | Freq: Two times a day (BID) | ORAL | Status: DC
  Administered 2017-06-15 – 2017-06-17 (×4): 200 mg via ORAL
  Filled 2017-06-15 (×4): qty 1

## 2017-06-15 MED ORDER — KETOROLAC TROMETHAMINE 30 MG/ML IJ SOLN
INTRAMUSCULAR | Status: AC
  Filled 2017-06-15: qty 1

## 2017-06-15 MED ORDER — FERROUS SULFATE 325 (65 FE) MG PO TABS
325.0000 mg | ORAL_TABLET | Freq: Three times a day (TID) | ORAL | Status: DC
  Administered 2017-06-15 – 2017-06-17 (×5): 325 mg via ORAL
  Filled 2017-06-15 (×6): qty 1

## 2017-06-15 MED ORDER — BUPIVACAINE HCL (PF) 0.25 % IJ SOLN
INTRAMUSCULAR | Status: AC
  Filled 2017-06-15: qty 20

## 2017-06-15 MED ORDER — MAGNESIUM HYDROXIDE 400 MG/5ML PO SUSP
30.0000 mL | Freq: Every day | ORAL | Status: DC | PRN
  Administered 2017-06-16 – 2017-06-17 (×2): 30 mL via ORAL
  Filled 2017-06-15 (×2): qty 30

## 2017-06-15 MED ORDER — ONDANSETRON HCL 4 MG/2ML IJ SOLN
4.0000 mg | Freq: Four times a day (QID) | INTRAMUSCULAR | Status: DC | PRN
  Administered 2017-06-17: 4 mg via INTRAVENOUS
  Filled 2017-06-15: qty 2

## 2017-06-15 MED ORDER — VANCOMYCIN HCL IN DEXTROSE 1-5 GM/200ML-% IV SOLN
INTRAVENOUS | Status: AC
  Administered 2017-06-15: 1000 mg via INTRAVENOUS
  Filled 2017-06-15: qty 200

## 2017-06-15 MED ORDER — ACETAMINOPHEN 650 MG RE SUPP: 650.0000 mg | RECTAL | Status: DC | PRN

## 2017-06-15 MED ORDER — CHLORHEXIDINE GLUCONATE CLOTH 2 % EX PADS: 6.0000 | MEDICATED_PAD | Freq: Once | CUTANEOUS | Status: DC

## 2017-06-15 MED ORDER — SODIUM CHLORIDE 0.9 % IV SOLN
INTRAVENOUS | Status: DC | PRN
  Administered 2017-06-15: 60 mL

## 2017-06-15 MED ORDER — SODIUM CHLORIDE 0.9 % IJ SOLN
INTRAMUSCULAR | Status: AC
  Filled 2017-06-15: qty 10

## 2017-06-15 MED ORDER — MIDAZOLAM HCL 5 MG/5ML IJ SOLN
INTRAMUSCULAR | Status: DC | PRN
  Administered 2017-06-15: 2 mg via INTRAVENOUS

## 2017-06-15 MED ORDER — ENOXAPARIN SODIUM 30 MG/0.3ML ~~LOC~~ SOLN
30.0000 mg | SUBCUTANEOUS | Status: DC
  Administered 2017-06-16 – 2017-06-17 (×2): 30 mg via SUBCUTANEOUS
  Filled 2017-06-15 (×2): qty 0.3

## 2017-06-15 MED ORDER — BUPIVACAINE HCL (PF) 0.25 % IJ SOLN
INTRAMUSCULAR | Status: AC
  Filled 2017-06-15: qty 10

## 2017-06-15 MED ORDER — GABAPENTIN 300 MG PO CAPS
300.0000 mg | ORAL_CAPSULE | Freq: Three times a day (TID) | ORAL | Status: DC
  Administered 2017-06-15 – 2017-06-17 (×5): 300 mg via ORAL
  Filled 2017-06-15 (×5): qty 1

## 2017-06-15 MED ORDER — GLYCOPYRROLATE 0.2 MG/ML IJ SOLN
INTRAMUSCULAR | Status: DC | PRN
  Administered 2017-06-15: 0.2 mg via INTRAVENOUS

## 2017-06-15 MED ORDER — SENNA 8.6 MG PO TABS
1.0000 | ORAL_TABLET | Freq: Two times a day (BID) | ORAL | Status: DC
  Administered 2017-06-15 – 2017-06-17 (×4): 8.6 mg via ORAL
  Filled 2017-06-15 (×4): qty 1

## 2017-06-15 MED ORDER — NEOMYCIN-POLYMYXIN B GU 40-200000 IR SOLN
Status: AC
  Filled 2017-06-15: qty 1

## 2017-06-15 MED ORDER — BUPIVACAINE HCL (PF) 0.5 % IJ SOLN
INTRAMUSCULAR | Status: DC | PRN
  Administered 2017-06-15: 3 mL

## 2017-06-15 SURGICAL SUPPLY — 53 items
AUTOTRANSFUS HAS 1/8 (MISCELLANEOUS) ×3
BLADE DEBAKEY 8.0 (BLADE) ×4 IMPLANT
BLADE DEBAKEY 8.0MM (BLADE) ×2
BLADE SAGITTAL WIDE XTHICK NO (BLADE) ×3 IMPLANT
CANISTER SUCT 1200ML W/VALVE (MISCELLANEOUS) ×3 IMPLANT
CANISTER SUCT 3000ML PPV (MISCELLANEOUS) ×3 IMPLANT
CAP KNEE TOTAL 3 SIGMA ×3 IMPLANT
CEMENT HV SMART SET (Cement) ×6 IMPLANT
CHLORAPREP W/TINT 26ML (MISCELLANEOUS) ×6 IMPLANT
COOLER POLAR GLACIER W/PUMP (MISCELLANEOUS) ×3 IMPLANT
CUFF TOURN 24 STER (MISCELLANEOUS) IMPLANT
CUFF TOURN 30 STER DUAL PORT (MISCELLANEOUS) ×3 IMPLANT
DRAPE INCISE IOBAN 66X60 STRL (DRAPES) ×3 IMPLANT
DRAPE SHEET LG 3/4 BI-LAMINATE (DRAPES) ×6 IMPLANT
DRSG AQUACEL AG ADV 3.5X10 (GAUZE/BANDAGES/DRESSINGS) IMPLANT
DRSG AQUACEL AG ADV 3.5X14 (GAUZE/BANDAGES/DRESSINGS) ×3 IMPLANT
DRSG TEGADERM 4X4.75 (GAUZE/BANDAGES/DRESSINGS) ×3 IMPLANT
ELECT REM PT RETURN 9FT ADLT (ELECTROSURGICAL) ×3
ELECTRODE REM PT RTRN 9FT ADLT (ELECTROSURGICAL) ×1 IMPLANT
GAUZE SPONGE 4X4 12PLY STRL (GAUZE/BANDAGES/DRESSINGS) ×3 IMPLANT
GLOVE BIO SURGEON STRL SZ7.5 (GLOVE) ×12 IMPLANT
GLOVE BIO SURGEON STRL SZ8 (GLOVE) ×3 IMPLANT
GLOVE BIOGEL PI IND STRL 8.5 (GLOVE) ×1 IMPLANT
GLOVE BIOGEL PI INDICATOR 8.5 (GLOVE) ×2
GLOVE INDICATOR 8.0 STRL GRN (GLOVE) ×9 IMPLANT
GLOVE SURG ORTHO 8.5 STRL (GLOVE) ×12 IMPLANT
GOWN STRL REUS W/ TWL LRG LVL4 (GOWN DISPOSABLE) ×1 IMPLANT
GOWN STRL REUS W/TWL LRG LVL4 (GOWN DISPOSABLE) ×5 IMPLANT
IMMBOLIZER KNEE 19 BLUE UNIV (SOFTGOODS) ×3 IMPLANT
KIT RM TURNOVER STRD PROC AR (KITS) ×3 IMPLANT
NEEDLE SPNL 20GX3.5 QUINCKE YW (NEEDLE) ×3 IMPLANT
NS IRRIG 1000ML POUR BTL (IV SOLUTION) ×3 IMPLANT
PACK TOTAL KNEE (MISCELLANEOUS) ×3 IMPLANT
PAD WRAPON POLAR KNEE (MISCELLANEOUS) ×1 IMPLANT
PULSAVAC PLUS IRRIG FAN TIP (DISPOSABLE) ×3
SOL .9 NS 3000ML IRR  AL (IV SOLUTION) ×2
SOL .9 NS 3000ML IRR UROMATIC (IV SOLUTION) ×1 IMPLANT
SPONGE LAP 18X18 5 PK (GAUZE/BANDAGES/DRESSINGS) IMPLANT
STAPLER SKIN PROX 35W (STAPLE) ×3 IMPLANT
SUCTION FRAZIER HANDLE 10FR (MISCELLANEOUS) ×2
SUCTION TUBE FRAZIER 10FR DISP (MISCELLANEOUS) ×1 IMPLANT
SUT BONE WAX W31G (SUTURE) ×3 IMPLANT
SUT DVC 2 QUILL PDO  T11 36X36 (SUTURE) ×2
SUT DVC 2 QUILL PDO T11 36X36 (SUTURE) ×1 IMPLANT
SUT QUILL PDO 0 36 36 VIOLET (SUTURE) ×3 IMPLANT
SYR 20CC LL (SYRINGE) ×9 IMPLANT
SYSTEM AUTOTRANSFUS DUAL TROCR (MISCELLANEOUS) ×1 IMPLANT
TAPE MICROFOAM 4IN (TAPE) ×3 IMPLANT
TIP FAN IRRIG PULSAVAC PLUS (DISPOSABLE) ×1 IMPLANT
TOWER CARTRIDGE SMART MIX (DISPOSABLE) ×3 IMPLANT
TRAY FOLEY W/METER SILVER 16FR (SET/KITS/TRAYS/PACK) ×3 IMPLANT
TUBE SUCT KAM VAC (TUBING) ×3 IMPLANT
WRAPON POLAR PAD KNEE (MISCELLANEOUS) ×3

## 2017-06-15 NOTE — Anesthesia Preprocedure Evaluation (Signed)
Anesthesia Evaluation  Patient identified by MRN, date of birth, ID band Patient awake    Reviewed: Allergy & Precautions, NPO status , Patient's Chart, lab work & pertinent test results  History of Anesthesia Complications Negative for: history of anesthetic complications  Airway Mallampati: II  TM Distance: >3 FB Neck ROM: Full    Dental  (+) Edentulous Upper, Edentulous Lower   Pulmonary asthma , former smoker,    breath sounds clear to auscultation- rhonchi (-) wheezing      Cardiovascular hypertension, Pt. on medications (-) CAD, (-) Past MI and (-) Cardiac Stents  Rhythm:Regular Rate:Normal - Systolic murmurs and - Diastolic murmurs    Neuro/Psych PSYCHIATRIC DISORDERS Depression Bipolar Disorder negative neurological ROS     GI/Hepatic GERD  ,(+) Hepatitis -, C  Endo/Other  diabetes, Oral Hypoglycemic Agents  Renal/GU negative Renal ROS     Musculoskeletal  (+) Arthritis ,   Abdominal (+) + obese,   Peds  Hematology negative hematology ROS (+)   Anesthesia Other Findings Past Medical History: No date: Acid reflux disease No date: Asthma No date: Bipolar 1 disorder (HCC) No date: Diabetes mellitus without complication (HCC) No date: Hypertension No date: Liver hemangioma No date: Lung nodule No date: Psoriasis No date: Rheumatoid arthritis (HCC) No date: Tardive dyskinesia No date: Wears dentures     Comment:  full upper and lower   Reproductive/Obstetrics                             Lab Results  Component Value Date   WBC 8.3 06/01/2017   HGB 12.7 06/01/2017   HCT 39.6 06/01/2017   MCV 81.9 06/01/2017   PLT 259 06/01/2017    Anesthesia Physical Anesthesia Plan  ASA: III  Anesthesia Plan: Spinal   Post-op Pain Management:    Induction:   PONV Risk Score and Plan: 2 and Propofol infusion  Airway Management Planned: Natural Airway  Additional Equipment:    Intra-op Plan:   Post-operative Plan:   Informed Consent: I have reviewed the patients History and Physical, chart, labs and discussed the procedure including the risks, benefits and alternatives for the proposed anesthesia with the patient or authorized representative who has indicated his/her understanding and acceptance.   Dental advisory given  Plan Discussed with: CRNA and Anesthesiologist  Anesthesia Plan Comments:         Anesthesia Quick Evaluation

## 2017-06-15 NOTE — Anesthesia Postprocedure Evaluation (Signed)
Anesthesia Post Note  Patient: Kathryn Ramirez  Procedure(s) Performed: TOTAL KNEE ARTHROPLASTY (Right Knee)  Patient location during evaluation: PACU Anesthesia Type: Spinal Level of consciousness: oriented and awake and alert Pain management: pain level controlled Vital Signs Assessment: post-procedure vital signs reviewed and stable Respiratory status: spontaneous breathing, respiratory function stable and patient connected to nasal cannula oxygen Cardiovascular status: blood pressure returned to baseline and stable Postop Assessment: no headache, no backache, no apparent nausea or vomiting and spinal receding Anesthetic complications: no     Last Vitals:  Vitals:   06/15/17 1147 06/15/17 1202  BP: 109/78 110/84  Pulse: 75 75  Resp: 11 14  Temp:  (!) 36.1 C  SpO2: 98% 100%    Last Pain:  Vitals:   06/15/17 1333  TempSrc:   PainSc: 0-No pain                 Kaspar Albornoz

## 2017-06-15 NOTE — H&P (Signed)
THE PATIENT WAS SEEN PRIOR TO SURGERY TODAY.  HISTORY, ALLERGIES, HOME MEDICATIONS AND OPERATIVE PROCEDURE WERE REVIEWED. RISKS AND BENEFITS OF SURGERY DISCUSSED WITH PATIENT AGAIN.  NO CHANGES FROM INITIAL HISTORY AND PHYSICAL NOTED.    

## 2017-06-15 NOTE — Transfer of Care (Signed)
Immediate Anesthesia Transfer of Care Note  Patient: Kathryn Ramirez  Procedure(s) Performed: TOTAL KNEE ARTHROPLASTY (Right Knee)  Patient Location: PACU  Anesthesia Type:Spinal  Level of Consciousness: awake, alert , oriented and patient cooperative  Airway & Oxygen Therapy: Patient Spontanous Breathing and Patient connected to nasal cannula oxygen  Post-op Assessment: Report given to RN and Post -op Vital signs reviewed and stable  Post vital signs: Reviewed and stable  Last Vitals:  Vitals:   06/15/17 0615  BP: (!) 148/88  Pulse: 84  Resp: 18  Temp: (!) 36 C  SpO2: 95%    Last Pain:  Vitals:   06/15/17 0615  TempSrc: Tympanic  PainSc: 6          Complications: No apparent anesthesia complications

## 2017-06-15 NOTE — Op Note (Signed)
DATE OF SURGERY:  06/15/2017 TIME: 10:28 AM  PATIENT NAME:  Kathryn Ramirez   AGE: 66 y.o.    PRE-OPERATIVE DIAGNOSIS:  M17.11 Unilateral primary osteoarthritis, right knee  POST-OPERATIVE DIAGNOSIS:  Same  PROCEDURE:  Procedure(s): TOTAL KNEE ARTHROPLASTY RIGHT KNEE   SURGEON:  Hanz Winterhalter E, MD   ASSISTANT:   OPERATIVE IMPLANTS: Depuy LCS Femur/Patella size STANDARD, Tibia size # 3,  Rotating platform polyethylene size 10  mm  Total tourniquet time was 110 minutes.  PREOPERATIVE INDICATIONS:  Kayah D Gherardi is a 66 y.o. year old female with end stage bone on bone degenerative arthritis of the knee who failed conservative treatment, including injections, antiinflammatories, activity modification, and assistive devices, and had significant impairment of their activities of daily living, and elected for Total Knee Arthroplasty.   The risks, benefits, and alternatives were discussed at length including but not limited to the risks of infection, bleeding, nerve injury, stiffness, blood clots, the need for revision surgery, cardiopulmonary complications, among others, and they were willing to proceed.  OPERATIVE FINDINGS AND UNIQUE ASPECTS OF THE CASE:  SEVERE ARTHRITIS LATERAL AND MEDIAL  OPERATIVE DESCRIPTION:   The patient was brought to the operative room and placed in a supine position. Spinal anesthesia was administered. IV VANCOMYCIN antibiotics were given. The lower extremity was prepped and draped in the usual sterile fashion. Time out was performed. The leg was elevated and exsanguinated and the tourniquet was inflated to 350 mmHg  An anterior midline incision was made.  Anterior quadriceps tendon splitting approach was performed. The patella was everted and osteophytes were removed. The anterior horn of the medial and lateral meniscus was removed.  Then the extramedullary tibial cutting jig was utilized making the appropriate cut using the anterior tibial crest as a  reference building in appropriate posterior slope. Care was taken during the cut to protect the medial and collateral ligaments. The proximal tibia was removed along with the posterior horns of the menisci. The PCL was sacrificed.  The distal femur was sized as above . Medial release was carried out. The anterior femoral cutting guide was aligned and centering hole made. The rotation guide was inserted and the anterior cutting guide pinned in place, and was in excellent alignment. The posterior femoral cuts were made. The flexion gap was established. The distal femoral cutting guide was introduced at 4 of valgus. This was pinned and the distal femoral cut made. The extension gap was established and was stable. The finishing guide was applied and finishing cuts made. The Mchale retractor was inserted and the keeled tibial trial was pinned in place. Centering hole was made and the keel inserted. The femoral component was inserted along with a polyethylene  insert and the knee articulated.  Extension and flexion showed good stability throughout. The patella was then sized and cut made for the patellar component. Centering holes were made. The trial was inserted and the knee articulated nicely with no need for lateral release. The trials were all removed and the knee thoroughly irrigated with pulsed lavage. Exparil was injected. The knee was dried and the cement mixed. The  keeled tibial component,  femoral component and patellar components were all cemented in place and excess cement was removed. The cement was allowed to harden for 10 minutes. Further irrigation was carried out. Bone wax was applied to all raw bony surfaces. Autovac drains were inserted. Quarter percent plain Marcaine, Toradol and morphine were injected. The capsule was closed with #2 Quill suture, and the  subcutaneous tissues were closed with 0 Quill suture. The skin was closed with staples.Sponge and needle counts were correct.  Aquacel dressing  with TENS pads and a dry sterile dressing were applied. Polar Care and knee immobilizer were applied. Tourniquet was deflated with excellent return of blood flow to foot. Patient was transferred to a hospital bed and taken to the recovery room in good condition.  Park Breed, MD

## 2017-06-15 NOTE — Progress Notes (Signed)
Correct time is 1453

## 2017-06-15 NOTE — Anesthesia Post-op Follow-up Note (Signed)
Anesthesia QCDR form completed.        

## 2017-06-15 NOTE — Care Management Note (Signed)
Case Management Note  Patient Details  Name: Kathryn Ramirez MRN: [id number] Date of Birth: 23-Jul-1951  Subjective/Objective:  RNCM consult for discharge planning. Patient lives at home alone. She does not drive, uses Medicaid transportation for appointments. She has a walker. Patient states she has her first outpatient PT appointment set up at Dr. Sanjuan Dame office. PT remains pending. Patient is RNCM to follow for discharge planning needs                     Action/Plan: PT pending.   Expected Discharge Date:                  Expected Discharge Plan:  [place]  In-House Referral:     Discharge planning Services  CM Consult  Post Acute Care Choice:    Choice offered to:     DME Arranged:    DME Agency:     HH Arranged:    [place] Agency:     Status of Service:  In process, will continue to follow  If discussed at Long Length of Stay Meetings, dates discussed:    Additional Comments:  Jolly Mango, RN 06/15/2017, 3:29 PM

## 2017-06-15 NOTE — Progress Notes (Signed)
PT Cancellation Note  Patient Details Name: Kathryn Ramirez MRN: [id number] DOB: [birth year]   Cancelled Treatment:    Reason Eval/Treat Not Completed: Medical issues which prohibited therapy. Chart reviewed. Attempted to see patient however she has not recover full motor and sensory function. No appropriate for evaluation at this time. PT evaluation will be performed at later date. RN to dangle.   Lyndel Safe Jalani Cullifer PT, DPT   Jax Abdelrahman 06/15/2017, 3:45 PM

## 2017-06-15 NOTE — Anesthesia Procedure Notes (Signed)
Spinal  Patient location during procedure: OR Start time: 06/15/2017 7:42 AM End time: 06/15/2017 7:47 AM Staffing Anesthesiologist: Emmie Niemann, MD Resident/CRNA: Bernardo Heater, CRNA Performed: resident/CRNA  Preanesthetic Checklist Completed: patient identified, site marked, surgical consent, pre-op evaluation, timeout performed, IV checked, risks and benefits discussed and monitors and equipment checked Spinal Block Patient position: sitting Prep: ChloraPrep Patient monitoring: heart rate, continuous pulse ox, blood pressure and cardiac monitor Approach: midline Location: L4-5 Injection technique: single-shot Needle Needle type: Introducer and Pencil-Tip  Needle gauge: 24 G Needle length: 9 cm Additional Notes Negative paresthesia. Negative blood return. Positive free-flowing CSF. Expiration date of kit checked and confirmed. Patient tolerated procedure well, without complications.

## 2017-06-16 LAB — BASIC METABOLIC PANEL
ANION GAP: 4 — AB (ref 5–15)
BUN: 12 mg/dL (ref 6–20)
CO2: 27 mmol/L (ref 22–32)
Calcium: 8.1 mg/dL — ABNORMAL LOW (ref 8.9–10.3)
Chloride: 105 mmol/L (ref 101–111)
Creatinine, Ser: 0.73 mg/dL (ref 0.44–1.00)
GFR calc Af Amer: >60 mL/min (ref >60)
Glucose, Bld: 143 mg/dL — ABNORMAL HIGH (ref 65–99)
POTASSIUM: 4.3 mmol/L (ref 3.5–5.1)
SODIUM: 136 mmol/L (ref 135–145)

## 2017-06-16 LAB — CBC
HCT: 33.8 % — ABNORMAL LOW (ref 35.0–47.0)
Hemoglobin: 11 g/dL — ABNORMAL LOW (ref 12.0–16.0)
MCH: 26.9 pg (ref 26.0–34.0)
MCHC: 32.5 g/dL (ref 32.0–36.0)
MCV: 82.6 fL (ref 80.0–100.0)
PLATELETS: 239 10*3/uL (ref 150–440)
RBC: 4.09 MIL/uL (ref 3.80–5.20)
RDW: 14.7 % — ABNORMAL HIGH (ref 11.5–14.5)
WBC: 9 10*3/uL (ref 3.6–11.0)

## 2017-06-16 NOTE — Evaluation (Signed)
Physical Therapy Evaluation Patient Details Name: Kathryn Ramirez MRN: [id number] DOB: Aug 01, 1950 Today's Date: 06/16/2017   History of Present Illness  Patient is a 66 y/o female that is s/p R TKR on 06/15/2017.  Clinical Impression  Patient seen for initial evaluation s/p R TKR on 11/21. She is initially hesitant but demonstrates good R quad strength, however she would certainly continue to benefit from knee immobilizer as she struggles to complete SLRs. She demonstrates good trunk control, though requires assistance for RLE secondary to quad weakness for transfers and bed mobility. She is able to follow commands well for gait mechanics and increasing UE WBing throughout gait and stride lengths, though she fatigues quickly. She would benefit from SNF placement at discharge at this to increase her independence with mobility.     Follow Up Recommendations SNF    Equipment Recommendations  Rolling walker with 5" wheels    Recommendations for Other Services       Precautions / Restrictions Precautions Precautions: Fall Required Braces or Orthoses: Knee Immobilizer - Right Knee Immobilizer - Right: On except when in CPM Restrictions Weight Bearing Restrictions: Yes RLE Weight Bearing: Partial weight bearing RLE Partial Weight Bearing Percentage or Pounds: 50% Other Position/Activity Restrictions: Knee immobilizer on at all times except when in CPM      Mobility  Bed Mobility Overal bed mobility: Needs Assistance Bed Mobility: Supine to Sit     Supine to sit: Min guard;Min assist     General bed mobility comments: Patient requires min A to bring RLE to the EOB, otherwise she demonstrates good trunk control with assistance from her UEs.   Transfers Overall transfer level: Needs assistance Equipment used: Rolling walker (2 wheeled) Transfers: Sit to/from Stand Sit to Stand: Min guard;Min assist         General transfer comment: Patient is able to transfer sit to stand  with use of UEs and cuing for technique with very little assistance from therapist.   Ambulation/Gait Ambulation/Gait assistance: Min guard;Min assist Ambulation Distance (Feet): 30 Feet Assistive device: Rolling walker (2 wheeled) Gait Pattern/deviations: Step-to pattern;Antalgic;Trunk flexed;Decreased weight shift to right   Gait velocity interpretation: <1.8 ft/sec, indicative of risk for recurrent falls General Gait Details: PAtient takes step to gait pattern with minimal lateral shift on R side secondary to pain/weakness. No buckling noted during ambulation. Gait noted to improve throughout session, though she fatigues quickly.  Stairs            Wheelchair Mobility    Modified Rankin (Stroke Patients Only)       Balance Overall balance assessment: Needs assistance Sitting-balance support: Bilateral upper extremity supported Sitting balance-Leahy Scale: Good     Standing balance support: Bilateral upper extremity supported Standing balance-Leahy Scale: Fair                               Pertinent Vitals/Pain Pain Assessment: Faces Faces Pain Scale: Hurts little more Pain Location: R knee Pain Descriptors / Indicators: Aching;Operative site guarding;Sore Pain Intervention(s): Limited activity within patient's tolerance;Repositioned;Monitored during session;Ice applied    Home Living Family/patient expects to be discharged to:: Skilled nursing facility Living Arrangements: Alone Available Help at Discharge: Available PRN/intermittently Type of Home: House Home Access: Level entry     Home Layout: One level Home Equipment: Cane - single point      Prior Function Level of Independence: Independent         Comments: Patient  relies on Medicaid transportation, she has bars in the bathroom at home and intermittently uses a SPC prior to admission.      Hand Dominance        Extremity/Trunk Assessment   Upper Extremity Assessment Upper  Extremity Assessment: Overall WFL for tasks assessed    Lower Extremity Assessment Lower Extremity Assessment: RLE deficits/detail RLE Deficits / Details: Patient has difficulty with completing SLRs without PT assistance       Communication   Communication: No difficulties  Cognition Arousal/Alertness: Awake/alert Behavior During Therapy: WFL for tasks assessed/performed Overall Cognitive Status: Within Functional Limits for tasks assessed                                        General Comments General comments (skin integrity, edema, etc.): TENS unit in place throughout session.     Exercises Total Joint Exercises Ankle Circles/Pumps: AROM;Both;15 reps Gluteal Sets: AROM;Both;10 reps Heel Slides: AROM;Left;AAROM;Right;10 reps Hip ABduction/ADduction: AROM;15 reps Straight Leg Raises: AROM;Left;AAROM;Right;15 reps Goniometric ROM: 12-53   Assessment/Plan    PT Assessment Patient needs continued PT services  PT Problem List Decreased strength;Decreased mobility;Decreased safety awareness;Pain;Decreased balance;Decreased knowledge of use of DME;Decreased activity tolerance;Cardiopulmonary status limiting activity;Decreased range of motion       PT Treatment Interventions DME instruction;Therapeutic activities;Gait training;Therapeutic exercise;Stair training;Balance training;Neuromuscular re-education;Manual techniques    PT Goals (Current goals can be found in the Care Plan section)  Acute Rehab PT Goals Patient Stated Goal: To increase her independence with mobility.  PT Goal Formulation: With patient Time For Goal Achievement: 06/30/17 Potential to Achieve Goals: Good    Frequency BID   Barriers to discharge        Co-evaluation               AM-PAC PT "6 Clicks" Daily Activity  Outcome Measure Difficulty turning over in bed (including adjusting bedclothes, sheets and blankets)?: A Little Difficulty moving from lying on back to sitting on  the side of the bed? : A Lot Difficulty sitting down on and standing up from a chair with arms (e.g., wheelchair, bedside commode, etc,.)?: A Little Help needed moving to and from a bed to chair (including a wheelchair)?: A Little Help needed walking in hospital room?: A Lot Help needed climbing 3-5 steps with a railing? : A Lot 6 Click Score: 15    End of Session Equipment Utilized During Treatment: Gait belt Activity Tolerance: Patient tolerated treatment well Patient left: in chair;with call bell/phone within reach;with SCD's reapplied;with chair alarm set Nurse Communication: Mobility status PT Visit Diagnosis: Muscle weakness (generalized) (M62.81);Pain;Difficulty in walking, not elsewhere classified (R26.2) Pain - Right/Left: Right Pain - part of body: Knee    Time: 1452-1519 PT Time Calculation (min) (ACUTE ONLY): 27 min   Charges:   PT Evaluation $PT Eval Moderate Complexity: 1 Mod PT Treatments $Therapeutic Exercise: 8-22 mins   PT G Codes:   PT G-Codes **NOT FOR INPATIENT CLASS** Functional Assessment Tool Used: AM-PAC 6 Clicks Basic Mobility Functional Limitation: Mobility: Walking and moving around Mobility: Walking and Moving Around Current Status (G2542): At least 40 percent but less than 60 percent impaired, limited or restricted Mobility: Walking and Moving Around Goal Status [phone]): At least 20 percent but less than 40 percent impaired, limited or restricted    Royce Macadamia PT, DPT, CSCS    06/16/2017, 4:48 PM

## 2017-06-16 NOTE — Progress Notes (Signed)
Subjective:  Patient reports pain as moderate.    Objective:   VITALS:   Vitals:   06/15/17 1937 06/15/17 2338 06/16/17 0514 06/16/17 0736  BP: (!) 97/56 (!) 95/50 (!) 100/58 (!) 154/91  Pulse: 75 77 78 84  Resp: 18  16   Temp: (!) 97.5 F (36.4 C) 97.9 F (36.6 C) 98.9 F (37.2 C) 97.9 F (36.6 C)  TempSrc: Oral Oral Oral Oral  SpO2: 98% 92% 95% 95%  Weight:      Height:        PHYSICAL EXAM:  Sensation intact distally Intact pulses distally Dorsiflexion/Plantar flexion intact Incision: dressing C/D/I Compartment soft  LABS  Results for orders placed or performed during the hospital encounter of 06/15/17 (from the past 24 hour(s))  Glucose, capillary     Status: Abnormal   Collection Time: 06/15/17 10:37 AM  Result Value Ref Range   Glucose-Capillary 109 (H) 65 - 99 mg/dL  CBC     Status: Abnormal   Collection Time: 06/15/17 12:52 PM  Result Value Ref Range   WBC 10.1 3.6 - 11.0 K/uL   RBC 4.60 3.80 - 5.20 MIL/uL   Hemoglobin 12.3 12.0 - 16.0 g/dL   HCT 38.1 35.0 - 47.0 %   MCV 82.8 80.0 - 100.0 fL   MCH 26.8 26.0 - 34.0 pg   MCHC 32.4 32.0 - 36.0 g/dL   RDW 14.9 (H) 11.5 - 14.5 %   Platelets 251 150 - 440 K/uL  Creatinine, serum     Status: None   Collection Time: 06/15/17 12:52 PM  Result Value Ref Range   Creatinine, Ser 0.65 0.44 - 1.00 mg/dL   GFR calc non Af Amer >60 >60 mL/min   GFR calc Af Amer >60 >60 mL/min  CBC     Status: Abnormal   Collection Time: 06/16/17  3:52 AM  Result Value Ref Range   WBC 9.0 3.6 - 11.0 K/uL   RBC 4.09 3.80 - 5.20 MIL/uL   Hemoglobin 11.0 (L) 12.0 - 16.0 g/dL   HCT 33.8 (L) 35.0 - 47.0 %   MCV 82.6 80.0 - 100.0 fL   MCH 26.9 26.0 - 34.0 pg   MCHC 32.5 32.0 - 36.0 g/dL   RDW 14.7 (H) 11.5 - 14.5 %   Platelets 239 150 - 440 K/uL  Basic metabolic panel     Status: Abnormal   Collection Time: 06/16/17  3:52 AM  Result Value Ref Range   Sodium 136 135 - 145 mmol/L   Potassium 4.3 3.5 - 5.1 mmol/L   Chloride  105 101 - 111 mmol/L   CO2 27 22 - 32 mmol/L   Glucose, Bld 143 (H) 65 - 99 mg/dL   BUN 12 6 - 20 mg/dL   Creatinine, Ser 0.73 0.44 - 1.00 mg/dL   Calcium 8.1 (L) 8.9 - 10.3 mg/dL   GFR calc non Af Amer >60 >60 mL/min   GFR calc Af Amer >60 >60 mL/min   Anion gap 4 (L) 5 - 15    Dg Knee Right Port  Result Date: 06/15/2017 CLINICAL DATA:  Postop EXAM: PORTABLE RIGHT KNEE - 1-2 VIEW COMPARISON:  07/03/2015 FINDINGS: Changes of right knee replacement. Soft tissue drains in place. No hardware or bony complicating feature. IMPRESSION: Right knee replacement.  No visible complicating feature. Electronically Signed   By: Rolm Baptise M.D.   On: 06/15/2017 11:20    Assessment/Plan: 1 Day Post-Op   Active Problems:   Total knee  replacement status   Advance diet Up with therapy D/C IV fluids Plan for discharge tomorrow   Drain removed   Lovell Sheehan , MD 06/16/2017, 8:55 AM

## 2017-06-16 NOTE — Clinical Social Work Note (Signed)
CSW received consult for placement. CSW will follow pending PT recommendations.  [name], MSW, Latanya Presser [phone]

## 2017-06-16 NOTE — Plan of Care (Signed)
  Progressing Education: Knowledge of General Education information will improve 06/16/2017 0426 - Progressing by Luceal Hollibaugh, Lucille Passy, RN Health Behavior/Discharge Planning: Ability to manage health-related needs will improve 06/16/2017 0426 - Progressing by Giuliana Handyside, Lucille Passy, RN Clinical Measurements: Ability to maintain clinical measurements within normal limits will improve 06/16/2017 0426 - Progressing by Channing Yeager, Lucille Passy, RN Will remain free from infection 06/16/2017 0426 - Progressing by Ojani Berenson, Lucille Passy, RN Diagnostic test results will improve 06/16/2017 0426 - Progressing by Lazara Grieser, Lucille Passy, RN Respiratory complications will improve 06/16/2017 0426 - Progressing by Bryna Colander, RN Cardiovascular complication will be avoided 06/16/2017 0426 - Progressing by Bryna Colander, RN Activity: Risk for activity intolerance will decrease 06/16/2017 0426 - Progressing by Bryna Colander, RN Nutrition: Adequate nutrition will be maintained 06/16/2017 0426 - Progressing by Bryna Colander, RN Coping: Level of anxiety will decrease 06/16/2017 0426 - Progressing by Bryna Colander, RN Elimination: Will not experience complications related to bowel motility 06/16/2017 0426 - Progressing by Faye Strohman, Lucille Passy, RN Will not experience complications related to urinary retention 06/16/2017 0426 - Progressing by Harel Repetto, Lucille Passy, RN Pain Managment: General experience of comfort will improve 06/16/2017 0426 - Progressing by Kalisi Bevill, Lucille Passy, RN Safety: Ability to remain free from injury will improve 06/16/2017 0426 - Progressing by Sergio Zawislak, Lucille Passy, RN Skin Integrity: Risk for impaired skin integrity will decrease 06/16/2017 0426 - Progressing by Bryna Colander, RN Education: Knowledge of the prescribed therapeutic regimen will improve 06/16/2017 0426 - Progressing by Lanie Schelling, Lucille Passy, RN Activity: Ability to avoid complications of  mobility impairment will improve 06/16/2017 0426 - Progressing by Jahmal Dunavant, Lucille Passy, RN Range of joint motion will improve 06/16/2017 0426 - Progressing by Bryna Colander, RN Clinical Measurements: Postoperative complications will be avoided or minimized 06/16/2017 0426 - Progressing by Bryna Colander, RN Pain Management: Pain level will decrease with appropriate interventions 06/16/2017 0426 - Progressing by Bryna Colander, RN Skin Integrity: Signs of wound healing will improve 06/16/2017 0426 - Progressing by Doyne Micke, Lucille Passy, RN

## 2017-06-17 LAB — CBC
HEMATOCRIT: 35.9 % (ref 35.0–47.0)
Hemoglobin: 11.6 g/dL — ABNORMAL LOW (ref 12.0–16.0)
MCH: 26.5 pg (ref 26.0–34.0)
MCHC: 32.3 g/dL (ref 32.0–36.0)
MCV: 82 fL (ref 80.0–100.0)
Platelets: 279 10*3/uL (ref 150–440)
RBC: 4.38 MIL/uL (ref 3.80–5.20)
RDW: 14.9 % — ABNORMAL HIGH (ref 11.5–14.5)
WBC: 11.9 10*3/uL — AB (ref 3.6–11.0)

## 2017-06-17 MED ORDER — ASPIRIN EC 325 MG PO TBEC: 325.0000 mg | DELAYED_RELEASE_TABLET | Freq: Two times a day (BID) | ORAL | 3 refills | Status: DC

## 2017-06-17 MED ORDER — HYDROCODONE-ACETAMINOPHEN 7.5-325 MG PO TABS: 1.0000 | ORAL_TABLET | ORAL | 0 refills | Status: DC | PRN

## 2017-06-17 MED ORDER — ONDANSETRON HCL 4 MG PO TABS: 4.0000 mg | ORAL_TABLET | Freq: Every day | ORAL | 1 refills | Status: DC | PRN

## 2017-06-17 MED ORDER — HYDROCODONE-ACETAMINOPHEN 5-325 MG PO TABS: 1.0000 | ORAL_TABLET | ORAL | 0 refills | Status: DC | PRN

## 2017-06-17 MED ORDER — SENNA 8.6 MG PO TABS: 1.0000 | ORAL_TABLET | Freq: Two times a day (BID) | ORAL | 0 refills | Status: DC

## 2017-06-17 NOTE — Plan of Care (Signed)
  Progressing Education: Knowledge of General Education information will improve 06/17/2017 0505 - Progressing by Savvy Peeters, Lucille Passy, RN Health Behavior/Discharge Planning: Ability to manage health-related needs will improve 06/17/2017 0505 - Progressing by Huckleberry Martinson, Lucille Passy, RN Clinical Measurements: Ability to maintain clinical measurements within normal limits will improve 06/17/2017 0505 - Progressing by Thelmer Legler, Lucille Passy, RN Will remain free from infection 06/17/2017 0505 - Progressing by Kerman Pfost, Lucille Passy, RN Diagnostic test results will improve 06/17/2017 0505 - Progressing by Reeta Kuk, Lucille Passy, RN Respiratory complications will improve 06/17/2017 0505 - Progressing by Bryna Colander, RN Cardiovascular complication will be avoided 06/17/2017 0505 - Progressing by Evalena Fujii, Lucille Passy, RN Activity: Risk for activity intolerance will decrease 06/17/2017 0505 - Progressing by Averee Harb, Lucille Passy, RN Nutrition: Adequate nutrition will be maintained 06/17/2017 0505 - Progressing by Bryna Colander, RN Coping: Level of anxiety will decrease 06/17/2017 0505 - Progressing by Jeralynn Vaquera, Lucille Passy, RN Elimination: Will not experience complications related to bowel motility 06/17/2017 0505 - Progressing by Dio Giller, Lucille Passy, RN Will not experience complications related to urinary retention 06/17/2017 0505 - Progressing by Amin Fornwalt, Lucille Passy, RN Pain Managment: General experience of comfort will improve 06/17/2017 0505 - Progressing by Nikholas Geffre, Lucille Passy, RN Safety: Ability to remain free from injury will improve 06/17/2017 0505 - Progressing by Saryn Cherry, Lucille Passy, RN Skin Integrity: Risk for impaired skin integrity will decrease 06/17/2017 0505 - Progressing by Salih Williamson, Lucille Passy, RN Education: Knowledge of the prescribed therapeutic regimen will improve 06/17/2017 0505 - Progressing by Montrez Marietta, Lucille Passy, RN Activity: Ability to avoid complications of  mobility impairment will improve 06/17/2017 0505 - Progressing by Lizvet Chunn, Lucille Passy, RN Range of joint motion will improve 06/17/2017 0505 - Progressing by Bryna Colander, RN Clinical Measurements: Postoperative complications will be avoided or minimized 06/17/2017 0505 - Progressing by Bryna Colander, RN Pain Management: Pain level will decrease with appropriate interventions 06/17/2017 0505 - Progressing by Wynelle Cleveland Lucille Passy, RN Skin Integrity: Signs of wound healing will improve 06/17/2017 0505 - Progressing by Joesphine Schemm, Lucille Passy, RN

## 2017-06-17 NOTE — Care Management Note (Signed)
Case Management Note  Patient Details  Name: Kathryn Ramirez MRN: [id number] Date of Birth: 09-19-1950  Subjective/Objective:  Spoke with patient regarding PT recommendations. She is agreeable to go home with PT. Offered choice of home health agencies. [facility] for HHPT and a rolling walker. Patient should discharge today per orthopedic attending.                    Action/Plan: [facility] for HHPT and walker. Home on [facility].   Expected Discharge Date:                  Expected Discharge Plan:  [place]  In-House Referral:     Discharge planning Services  CM Consult  Post Acute Care Choice:  Durable Medical Equipment, Home Health Choice offered to:  Patient  DME Arranged:  Walker rolling DME Agency:  [facility] [place] Arranged:  PT [facility] Agency:  [facility]  Status of Service:  Completed, signed off  If discussed at [place] of Stay Meetings, dates discussed:    Additional Comments:  Jolly Mango, RN 06/17/2017, 10:48 AM

## 2017-06-17 NOTE — Progress Notes (Signed)
Physical Therapy Treatment Patient Details Name: Kathryn Ramirez MRN: [id number] DOB: 03/28/1951 Today's Date: 06/17/2017    History of Present Illness Patient is a 66 y/o female that is s/p R TKR on 06/15/2017.    PT Comments    Pt able to progress to ambulating 100 feet with RW with SBA; pt appearing to be able to maintain PWB'ing status during therapy activities.  Able to increase pt's R knee flexion ROM from 53 degrees yesterday to 80 degrees today; pt still about 10 degrees short of neutral for R knee extension.  Pt plans to discharge home today with HHPT.  Will continue to progress pt with strengthening, knee ROM, and progressive functional mobility per pt tolerance during hospitalization.    Follow Up Recommendations  Home health PT     Equipment Recommendations  Rolling walker with 5" wheels    Recommendations for Other Services       Precautions / Restrictions Precautions Precautions: Fall Required Braces or Orthoses: Knee Immobilizer - Right Knee Immobilizer - Right: On except when in CPM Restrictions Weight Bearing Restrictions: Yes RLE Weight Bearing: Partial weight bearing RLE Partial Weight Bearing Percentage or Pounds: 50% Other Position/Activity Restrictions: Knee immobilizer on at all times except when in CPM    Mobility  Bed Mobility Overal bed mobility: Modified Independent Bed Mobility: Supine to Sit;Sit to Supine     Supine to sit: Modified independent (Device/Increase time) Sit to supine: Modified independent (Device/Increase time)   General bed mobility comments: pt using B UE's to move R LE in/out of bed  Transfers Overall transfer level: Modified independent Equipment used: Rolling walker (2 wheeled) Transfers: Sit to/from [facility] Sit to Stand: Modified independent (Device/Increase time) Stand pivot transfers: Modified independent (Device/Increase time)       General transfer comment: steady safe transfers with RW;  no vc's required  Ambulation/Gait Ambulation/Gait assistance: Supervision Ambulation Distance (Feet): 100 Feet Assistive device: Rolling walker (2 wheeled) Gait Pattern/deviations: Step-to pattern Gait velocity: decreased   General Gait Details: decreased stance time R LE; occasional vc's to increase UE support through RW for PWB'ing status; occasional vc's to increase R LE step length   Stairs Stairs: (deferred; pt reports no stairs at home)          Wheelchair Mobility    Modified Rankin (Stroke Patients Only)       Balance Overall balance assessment: Needs assistance Sitting-balance support: No upper extremity supported;Feet supported Sitting balance-Leahy Scale: Good Sitting balance - Comments: sitting reaching within BOS   Standing balance support: Single extremity supported Standing balance-Leahy Scale: Fair Standing balance comment: requires at least single UE support for good standing balance                            Cognition Arousal/Alertness: Awake/alert Behavior During Therapy: WFL for tasks assessed/performed Overall Cognitive Status: Within Functional Limits for tasks assessed                                        Exercises Total Joint Exercises Long Arc Quad: AAROM;Strengthening;Right;10 reps;Seated Goniometric ROM: R knee extension AAROM 10 degrees short of neutral semi-supine in chair; R knee flexion AAROM 80 degrees in sitting (initially 50 degrees but with cueing and repositioning able to increase to 80 degrees)    General Comments General comments (skin integrity, edema, etc.): TENS  unit and knee immobilizer in place.  Nursing cleared pt for participation in physical therapy.  Pt agreeable to PT session.      Pertinent Vitals/Pain Pain Assessment: 0-10 Pain Score: 6  Pain Location: R knee Pain Descriptors / Indicators: Aching;Operative site guarding;Sore Pain Intervention(s): Limited activity within patient's  tolerance;Monitored during session;Premedicated before session;Repositioned;Ice applied  Vitals (HR and O2 on room air) stable and WFL throughout treatment session.    Home Living Family/patient expects to be discharged to:: Skilled nursing facility Living Arrangements: Alone Available Help at Discharge: Available PRN/intermittently Type of Home: House Home Access: Level entry   Home Layout: One level Home Equipment: Cane - single point;Other (comment);Walker - 4 wheels;Grab bars - tub/shower;Grab bars - toilet(pull cords beside bed and in bathroom)      Prior Function Level of Independence: Independent      Comments: Patient relies on Medicaid transportation, she has bars in the bathroom at home and intermittently uses a SPC prior to admission. Was independent with ADL. Endorses 1 fall in past 12 months.   PT Goals (current goals can now be found in the care plan section) Acute Rehab PT Goals Patient Stated Goal: return to PLOF PT Goal Formulation: With patient Time For Goal Achievement: 06/30/17 Potential to Achieve Goals: Good Progress towards PT goals: Progressing toward goals    Frequency    BID      PT Plan Discharge plan needs to be updated(CM notified)    Co-evaluation              AM-PAC PT "6 Clicks" Daily Activity  Outcome Measure  Difficulty turning over in bed (including adjusting bedclothes, sheets and blankets)?: A Little Difficulty moving from lying on back to sitting on the side of the bed? : A Little Difficulty sitting down on and standing up from a chair with arms (e.g., wheelchair, bedside commode, etc,.)?: A Little Help needed moving to and from a bed to chair (including a wheelchair)?: None Help needed walking in hospital room?: A Little Help needed climbing 3-5 steps with a railing? : A Little 6 Click Score: 19    End of Session Equipment Utilized During Treatment: Gait belt Activity Tolerance: Patient tolerated treatment well Patient  left: in chair;with call bell/phone within reach;with chair alarm set;with SCD's reapplied(B heels elevated via towel rolls) Nurse Communication: Mobility status;Precautions;Weight bearing status PT Visit Diagnosis: Muscle weakness (generalized) (M62.81);Pain;Difficulty in walking, not elsewhere classified (R26.2) Pain - Right/Left: Right Pain - part of body: Knee     Time: 7782-4235 PT Time Calculation (min) (ACUTE ONLY): 38 min  Charges:  $Gait Training: 8-22 mins $Therapeutic Exercise: 8-22 mins $Therapeutic Activity: 8-22 mins                    G CodesLeitha Bleak, PT 06/17/17, 12:23 PM [phone]

## 2017-06-17 NOTE — Progress Notes (Addendum)
Pt ready for d/c home today per MD. Pt met PT goals, walker was delivered to pt's room. Discharge instructions and prescriptions reviewed with pt and daughter, all questions answered. Dr. Harlow Mares resent hydrocodone script electronically as well as zofran to [facility]. Pt did report having BM this morning after suppository administered. Pt assisted to car via NT.   [place], Jerry Caras

## 2017-06-17 NOTE — Progress Notes (Signed)
Contacted Dr. Harlow Mares about getting paper script for hydrocodone. He is in the OR and will address when he is finished.

## 2017-06-17 NOTE — Discharge Summary (Addendum)
Physician Discharge Summary  Patient ID: Kathryn Ramirez MRN: [id number] DOB/AGE: 01/14/1951 66 y.o.  Admit date: 06/15/2017 Discharge date: 06/17/2017  Admission Diagnoses:  M17.11 Unilateral primary osteoarthritis, right knee <principal problem not specified>  Discharge Diagnoses:  M17.11 Unilateral primary osteoarthritis, right knee Active Problems:   Total knee replacement status   Past Medical History:  Diagnosis Date  . Acid reflux disease   . Asthma   . Bipolar 1 disorder ([place])   . Diabetes mellitus without complication ([place])   . Hypertension   . Liver hemangioma   . Lung nodule   . Psoriasis   . Rheumatoid arthritis ([place])   . Tardive dyskinesia   . Wears dentures    full upper and lower    Surgeries: Procedure(s): TOTAL KNEE ARTHROPLASTY on 06/15/2017   Consultants (if any):   Discharged Condition: Improved  Hospital Course: Kathryn Ramirez is an 66 y.o. female who was admitted 06/15/2017 with a diagnosis of  M17.11 Unilateral primary osteoarthritis, right knee <principal problem not specified> and went to the operating room on 06/15/2017 and underwent the above named procedures.    She was given perioperative antibiotics:  Anti-infectives (From admission, onward)   Start     Dose/Rate Route Frequency Ordered Stop   06/15/17 1830  vancomycin (VANCOCIN) IVPB 1000 mg/200 mL premix     1,000 mg 200 mL/hr over 60 Minutes Intravenous Every 12 hours 06/15/17 1243 06/16/17 1854   06/15/17 0603  ceFAZolin (ANCEF) 2-4 GM/100ML-% IVPB    Comments:  Kathryn Ramirez   : cabinet override      06/15/17 0603 06/15/17 0757   06/15/17 0600  ceFAZolin (ANCEF) IVPB 2g/100 mL premix     2 g 200 mL/hr over 30 Minutes Intravenous On call to O.R. 06/14/17 2133 06/15/17 0812   06/15/17 0600  vancomycin (VANCOCIN) IVPB 1000 mg/200 mL premix     1,000 mg 200 mL/hr over 60 Minutes Intravenous On call to O.R. 06/14/17 2133 06/15/17 0739    .  She was given sequential  compression devices, early ambulation, and ECASA for DVT prophylaxis.  She benefited maximally from the hospital stay and there were no complications.    Recent vital signs:  Vitals:   06/16/17 1939 06/17/17 0804  BP: (!) 147/77 116/71  Pulse: 80 81  Resp: 20 18  Temp: 98.1 F (36.7 C) 98.6 F (37 C)  SpO2: 92% 96%    Recent laboratory studies:  Lab Results  Component Value Date   HGB 11.6 (L) 06/17/2017   HGB 11.0 (L) 06/16/2017   HGB 12.3 06/15/2017   Lab Results  Component Value Date   WBC 11.9 (H) 06/17/2017   PLT 279 06/17/2017   Lab Results  Component Value Date   INR 1.05 06/01/2017   Lab Results  Component Value Date   NA 136 06/16/2017   K 4.3 06/16/2017   CL 105 06/16/2017   CO2 27 06/16/2017   BUN 12 06/16/2017   CREATININE 0.73 06/16/2017   GLUCOSE 143 (H) 06/16/2017    Discharge Medications:   Allergies as of 06/17/2017      Reactions   Codeine Nausea Only   Haldol [haloperidol] Other (See Comments)   Reaction: Pt "felt like her neck was going to snap." Sever neck stiffness. Reaction: Pt "felt like her neck was going to snap."   Haloperidol Lactate Other (See Comments)   Sever neck stiffness.   Trileptal [oxcarbazepine] Swelling      Medication List  TAKE these medications   acetaminophen 500 MG tablet Commonly known as:  TYLENOL Take 500 mg by mouth daily as needed for mild pain or moderate pain.   albuterol 108 (90 Base) MCG/ACT inhaler Commonly known as:  PROAIR HFA Inhale 2 puffs every 4 (four) hours as needed into the lungs for wheezing or shortness of breath.   alendronate 70 MG tablet Commonly known as:  FOSAMAX Take 1 tablet (70 mg total) by mouth every 7 (seven) days. Take with a full glass of water on an empty stomach. What changed:  additional instructions   ARIPiprazole 10 MG tablet Commonly known as:  ABILIFY Take 10 mg by mouth daily.   aspirin EC 325 MG tablet Take 1 tablet (325 mg total) by mouth 2 (two) times  daily.   buPROPion 150 MG 24 hr tablet Commonly known as:  WELLBUTRIN XL Take 150 mg by mouth daily.   calcipotriene 0.005 % ointment Commonly known as:  DOVONOX Apply topically 2 (two) times daily. What changed:    how much to take  when to take this  reasons to take this   diazepam 5 MG tablet Commonly known as:  VALIUM Take 2.5 mg by mouth 3 (three) times daily.   diclofenac sodium 1 % Gel Commonly known as:  VOLTAREN Apply 2 g topically 2 (two) times daily as needed (pain).   esomeprazole 40 MG capsule Commonly known as:  NEXIUM Take 1 capsule (40 mg total) daily by mouth.   gabapentin 300 MG capsule Commonly known as:  NEURONTIN Take 300 mg by mouth 2 (two) times daily.   HYDROcodone-acetaminophen 7.5-325 MG tablet Commonly known as:  NORCO Take 1 tablet by mouth every 4 (four) hours as needed for moderate pain.   metFORMIN 500 MG 24 hr tablet Commonly known as:  GLUCOPHAGE-XR Take 1 tablet (500 mg total) by mouth daily with breakfast.   ROBAXIN 500 MG tablet Generic drug:  methocarbamol Take 500 mg by mouth 2 (two) times daily.   rosuvastatin 10 MG tablet Commonly known as:  CRESTOR Take 1 tablet (10 mg total) by mouth at bedtime.   senna 8.6 MG Tabs tablet Commonly known as:  SENOKOT Take 1 tablet (8.6 mg total) by mouth 2 (two) times daily.   STIOLTO RESPIMAT 2.5-2.5 MCG/ACT Aers Generic drug:  Tiotropium Bromide-Olodaterol INHALE 2 PUFFS INTO THE LUNGS DAILY.   traZODone 50 MG tablet Commonly known as:  DESYREL Take 50 mg 2 (two) times daily by mouth.   VESICARE 10 MG tablet Generic drug:  solifenacin TAKE 1 TABLET BY MOUTH DAILY            Durable Medical Equipment  (From admission, onward)        Start     Ordered   06/15/17 1244  DME Walker rolling  Once    Question:  Patient needs a walker to treat with the following condition  Answer:  Total knee replacement status   06/15/17 1243      Diagnostic Studies: Dg Knee Right  Port  Result Date: 06/15/2017 CLINICAL DATA:  Postop EXAM: PORTABLE RIGHT KNEE - 1-2 VIEW COMPARISON:  07/03/2015 FINDINGS: Changes of right knee replacement. Soft tissue drains in place. No hardware or bony complicating feature. IMPRESSION: Right knee replacement.  No visible complicating feature. Electronically Signed   By: Rolm Baptise M.D.   On: 06/15/2017 11:20    Disposition: 01-Home or Self Care       Signed: Lovell Sheehan ,MD 06/17/2017,  11:05 AM

## 2017-06-17 NOTE — Clinical Social Work Note (Signed)
Clinical Social Work Assessment  Patient Details  Name: Kathryn Ramirez MRN: [id number] Date of Birth: 1951/07/06  Date of referral:  06/17/17               Reason for consult:  Facility Placement                Permission sought to share information with:    Permission granted to share information::     Name::        Agency::     Relationship::     Contact Information:     Housing/Transportation Living arrangements for the past 2 months:  Single Family Home Source of Information:  Patient Patient Interpreter Needed:  None Criminal Activity/Legal Involvement Pertinent to Current Situation/Hospitalization:  No - Comment as needed Significant Relationships:  Adult Children Lives with:  Self Do you feel safe going back to the place where you live?  Yes Need for family participation in patient care:  Yes (Comment)  Care giving concerns:  Patient lives in a handicap accessible apartment in [place].    Social Worker assessment / plan:  Holiday representative (CSW) received SNF consult. PT is recommending SNF. CSW met with patient alone at bedside to discuss D/C plan. CSW introduced self and explained role of CSW department. Patient reported that she lives alone in [place] and has bars to help pull herself up in her apartment along with a string to pull in case she falls in her bedroom and bathroom. Patient reported that she does receive disability $201 per month. CSW explained SNF process under medicaid. Patient refused SNF and stated that she wants to go home today. RN case manager aware of above. Please reconsult if future social work needs arise. CSW signing off.   PT changed recommendation from SNF to home health today. Per patient her daughter will pick her up from [facility] today.   Employment status:  Disabled (Comment on whether or not currently receiving Disability) Insurance information:  Medicaid In [place] PT Recommendations:  Home with [place] / Referral to community  resources:  Other (Comment Required)(Patient refused SNF. )  Patient/Family's Response to care:  Patient refused SNF.   Patient/Family's Understanding of and Emotional Response to Diagnosis, Current Treatment, and Prognosis:  Patient was pleasant and thanked CSW for assistance.   Emotional Assessment Appearance:  Appears stated age Attitude/Demeanor/Rapport:    Affect (typically observed):  Accepting, Adaptable, Pleasant Orientation:  Oriented to Self, Oriented to Place, Oriented to  Time, Oriented to Situation Alcohol / Substance use:  Not Applicable Psych involvement (Current and /or in the community):  No (Comment)  Discharge Needs  Concerns to be addressed:  No discharge needs identified Readmission within the last 30 days:  No Current discharge risk:  None Barriers to Discharge:  No Barriers Identified   Adelbert Gaspard, Veronia Beets, LCSW 06/17/2017, 10:15 PM

## 2017-06-17 NOTE — Evaluation (Signed)
Occupational Therapy Evaluation Patient Details Name: Kathryn Ramirez MRN: [id number] DOB: 10-23-1950 Today's Date: 06/17/2017    History of Present Illness Patient is a 66 y/o female that is s/p R TKR on 06/15/2017.   Clinical Impression   Pt is 66 year old female s/p R TKR.  Pt was independent in all ADLs prior to surgery and is eager to return to PLOF.  Pt currently requires minimal assist for LB dressing while in seated position due to pain and limited AROM of R knee.  Pt verbalized understanding of education in AE/DME for bathing/dressing/toileting, compression stocking mgt, and polar care mgt to maximize safety and functional independence. Reports she has a neighbor who can assist PRN with these things. Verbal cues required for hand placement/precautions during functional mobility tasks. Pt would benefit from additional instruction in dressing techniques with or without assistive devices for dressing and bathing skills as well as recommendations for home modifications to increase safety in the bathroom and prevent falls. Will assess for OT [facility] needs as pt progresses in therapy. Currently recommend STR prior to transition home.    Follow Up Recommendations  SNF    Equipment Recommendations  3 in 1 bedside commode    Recommendations for Other Services       Precautions / Restrictions Precautions Precautions: Fall Required Braces or Orthoses: Knee Immobilizer - Right Knee Immobilizer - Right: On except when in CPM Restrictions Weight Bearing Restrictions: Yes RLE Weight Bearing: Partial weight bearing RLE Partial Weight Bearing Percentage or Pounds: 50% Other Position/Activity Restrictions: Knee immobilizer on at all times except when in CPM      Mobility Bed Mobility               General bed mobility comments: deferred, up in recliner  Transfers Overall transfer level: Needs assistance Equipment used: Rolling walker (2 wheeled) Transfers: Sit to/from Stand Sit to  Stand: Min guard;Min assist         General transfer comment: VC for hand placement to maximize safety/precautions    Balance Overall balance assessment: Needs assistance Sitting-balance support: Bilateral upper extremity supported Sitting balance-Leahy Scale: Good     Standing balance support: Bilateral upper extremity supported Standing balance-Leahy Scale: Fair Standing balance comment: fair+                           ADL either performed or assessed with clinical judgement   ADL Overall ADL's : Needs assistance/impaired Eating/Feeding: Sitting;Set up   Grooming: Sitting;Set up   Upper Body Bathing: Sitting;Set up;Supervision/ safety   Lower Body Bathing: Sitting/lateral leans;Minimal assistance Lower Body Bathing Details (indicate cue type and reason): Pt verbalized understanding of education in AE/DME for bathing/dressing/toileting to maximize safety and functional independence. Upper Body Dressing : Sitting;Set up   Lower Body Dressing: Sit to/from stand;Minimal assistance   Toilet Transfer: RW;Minimal assistance;Min guard;Comfort height toilet;Ambulation             General ADL Comments: Pt verbalized understanding of education in AE/DME for bathing/dressing/toileting, compression stocking mgt, and polar care mgt to maximize safety and functional independence. Reports she has a neighbor who can assist PRN with these things.     Vision Baseline Vision/History: Wears glasses Wears Glasses: Reading only Patient Visual Report: No change from baseline       Perception     Praxis      Pertinent Vitals/Pain Pain Assessment: 0-10 Pain Score: 6  Pain Location: R knee Pain Descriptors /  Indicators: Aching;Operative site guarding;Sore Pain Intervention(s): Limited activity within patient's tolerance;Monitored during session;Repositioned;Ice applied;Premedicated before session     Hand Dominance     Extremity/Trunk Assessment Upper Extremity  Assessment Upper Extremity Assessment: Overall WFL for tasks assessed   Lower Extremity Assessment Lower Extremity Assessment: Defer to PT evaluation;RLE deficits/detail   Cervical / Trunk Assessment Cervical / Trunk Assessment: Normal   Communication Communication Communication: No difficulties   Cognition Arousal/Alertness: Awake/alert Behavior During Therapy: WFL for tasks assessed/performed Overall Cognitive Status: Within Functional Limits for tasks assessed                                     General Comments  TENS unit and knee immobilizer in place throughout session    Exercises     Shoulder Instructions      Home Living Family/patient expects to be discharged to:: Skilled nursing facility Living Arrangements: Alone Available Help at Discharge: Available PRN/intermittently Type of Home: House Home Access: Level entry     Home Layout: One level               Home Equipment: Cane - single point;Other (comment);Walker - 4 wheels;Grab bars - tub/shower;Grab bars - toilet(pull cords beside bed and in bathroom)          Prior Functioning/Environment Level of Independence: Independent        Comments: Patient relies on Medicaid transportation, she has bars in the bathroom at home and intermittently uses a SPC prior to admission. Was independent with ADL. Endorses 1 fall in past 12 months.        OT Problem List: Decreased strength;Decreased knowledge of use of DME or AE;Decreased range of motion;Decreased knowledge of precautions;Pain;Impaired balance (sitting and/or standing)      OT Treatment/Interventions: Self-care/ADL training;Therapeutic exercise;Therapeutic activities;DME and/or AE instruction;Patient/family education    OT Goals(Current goals can be found in the care plan section) Acute Rehab OT Goals Patient Stated Goal: return to PLOF OT Goal Formulation: With patient Time For Goal Achievement: 07/01/17 Potential to Achieve  Goals: Good ADL Goals Pt Will Perform Lower Body Dressing: with modified independence;with adaptive equipment Pt Will Transfer to Toilet: with supervision;ambulating(RW for ambulation, comfort height toilet)  OT Frequency: Min 1X/week   Barriers to D/C: Decreased caregiver support          Co-evaluation              AM-PAC PT "6 Clicks" Daily Activity     Outcome Measure Help from another person eating meals?: None Help from another person taking care of personal grooming?: A Little Help from another person toileting, which includes using toliet, bedpan, or urinal?: A Little Help from another person bathing (including washing, rinsing, drying)?: A Little Help from another person to put on and taking off regular upper body clothing?: A Little Help from another person to put on and taking off regular lower body clothing?: A Little 6 Click Score: 19   End of Session Equipment Utilized During Treatment: Gait belt;Rolling walker CPM Right Knee CPM Right Knee: Off  Activity Tolerance: Patient tolerated treatment well Patient left: in chair;with call bell/phone within reach;with chair alarm set;with SCD's reapplied;Other (comment)(polar care in place)  OT Visit Diagnosis: Other abnormalities of gait and mobility (R26.89)                Time: 3810-1751 OT Time Calculation (min): 24 min Charges:  OT General Charges $  OT Visit: 1 Visit OT Evaluation $OT Eval Low Complexity: 1 Low OT Treatments $Self Care/Home Management : 8-22 mins G-Codes: OT G-codes **NOT FOR INPATIENT CLASS** Functional Assessment Tool Used: AM-PAC 6 Clicks Daily Activity;Clinical judgement Functional Limitation: Self care Self Care Current Status (P6195): At least 20 percent but less than 40 percent impaired, limited or restricted Self Care Goal Status (K9326): At least 1 percent but less than 20 percent impaired, limited or restricted   Jeni Salles, MPH, MS, OTR/L ascom [phone] 06/17/17, 10:15  AM

## 2017-06-23 ENCOUNTER — Other Ambulatory Visit: Payer: Self-pay | Admitting: Family Medicine

## 2017-06-23 DIAGNOSIS — K219 Gastro-esophageal reflux disease without esophagitis: Secondary | ICD-10-CM

## 2017-06-29 ENCOUNTER — Other Ambulatory Visit: Payer: Self-pay | Admitting: Family Medicine

## 2017-06-29 DIAGNOSIS — K219 Gastro-esophageal reflux disease without esophagitis: Secondary | ICD-10-CM

## 2017-07-22 ENCOUNTER — Other Ambulatory Visit: Payer: Self-pay | Admitting: Family Medicine

## 2017-07-22 DIAGNOSIS — E1169 Type 2 diabetes mellitus with other specified complication: Secondary | ICD-10-CM

## 2017-07-22 DIAGNOSIS — E782 Mixed hyperlipidemia: Secondary | ICD-10-CM

## 2017-07-22 DIAGNOSIS — E669 Obesity, unspecified: Principal | ICD-10-CM

## 2017-08-23 ENCOUNTER — Other Ambulatory Visit: Payer: Self-pay | Admitting: Family Medicine

## 2017-08-23 DIAGNOSIS — E782 Mixed hyperlipidemia: Secondary | ICD-10-CM

## 2017-08-25 ENCOUNTER — Encounter: Payer: Self-pay | Admitting: Internal Medicine

## 2017-08-25 NOTE — Telephone Encounter (Signed)
This encounter was created in error - please disregard.

## 2017-10-03 ENCOUNTER — Ambulatory Visit: Payer: Medicaid Other | Admitting: Internal Medicine

## 2017-10-03 ENCOUNTER — Other Ambulatory Visit: Payer: Self-pay

## 2017-10-03 DIAGNOSIS — E782 Mixed hyperlipidemia: Secondary | ICD-10-CM

## 2017-10-03 DIAGNOSIS — N3281 Overactive bladder: Secondary | ICD-10-CM

## 2017-10-03 DIAGNOSIS — E1169 Type 2 diabetes mellitus with other specified complication: Secondary | ICD-10-CM

## 2017-10-03 DIAGNOSIS — E669 Obesity, unspecified: Principal | ICD-10-CM

## 2017-10-03 MED ORDER — ROSUVASTATIN CALCIUM 10 MG PO TABS: 10.0000 mg | ORAL_TABLET | Freq: Every day | ORAL | 0 refills | Status: DC

## 2017-10-03 MED ORDER — SOLIFENACIN SUCCINATE 10 MG PO TABS: 10.0000 mg | ORAL_TABLET | Freq: Every day | ORAL | 1 refills | Status: DC

## 2017-10-03 MED ORDER — METFORMIN HCL ER 500 MG PO TB24: 500.0000 mg | ORAL_TABLET | Freq: Every day | ORAL | 0 refills | Status: DC

## 2017-10-03 NOTE — Telephone Encounter (Signed)
Last Cr and SGPT reviewed; prescriptions approved  Please ask her to get on the schedule for a visit in April, 6 months or just after her last A1c

## 2017-10-04 NOTE — Telephone Encounter (Signed)
Pt has notified me that she has a new PCP, I have already called and canceled rx

## 2017-10-07 ENCOUNTER — Ambulatory Visit: Payer: Medicaid Other | Admitting: Internal Medicine

## 2017-10-16 NOTE — Progress Notes (Signed)
* [facility]     Assessment and Plan:  The patient is a 67 year old female with a history of chronic bronchitis, persistent asthma, vocal cord polyps, GERD, hoarseness of voice.  Asthma. --Continue stiolto and rescue inhaler.  --Avoid known triggers.   Cough. --Appears adequately controlled at this time.   Vocal cord polyps and hoarseness of voice. --These can be contributed by qvar, therefore should stop this, and continue with stiolto and nexium.  --Continue to follow up with ENT.   GERD. -With significant symptoms including nausea, is now status post colonoscopy and EGD by gastroenterology. -GERD symptoms are controlled by Nexium, to continue.  Lung nodules.  --Per history she was followed at [facility] and told that she had lung nodules. Most recent CT chest 11/2015 showed no evidence of nodules. No need for further workup.    Date: 10/16/2017  MRN# [id number] Kathryn Ramirez [birth year]   Kathryn Ramirez is a 67 y.o. old female seen in follow up for chief complaint of  Chief Complaint  Patient presents with  . Asthma    sob with exertion, patient states breathing ok.      HPI:   Patient is a pleasant 67 year old female past medical history of bipolar, tardive dyskinesia, rheumatoid arthritis, hypertension, multiple pulmonary nodules, asthma, acid reflux disease, obesity. She has a history of vocal cord polyps, followed by [facility]. She is a former smoker quit in August 2016, previously smoked one pack per day for 30 years. At last visit he was having persistent hoarseness, therefore Qvar was stopped, she was asked to continue Stiolto, as well as Nexium for GERD.  At last visit she also has significant confusion about her inhalers, she was using Qvar multiple times a day, including as a rescue inhaler. She notices no difference in her hoarseness, she has remained on stiolto, 2 puffs every morning, and albuterol as need for cough a few times per week.  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    She visits her daughter twice per week who has a dog and a cat. She no longer has symptoms of reflux and she is taking nexium.  **bronchoscopy 01/13/16 due to chronic cough - negative BAL **PFT on 12/11/15; Slightly reduced FEV1, normal FVC, ratio, RV, slightly increased RV/TLC, c/w mild air trapping. Normal DLCO, normal flow volume loops.  **Hx of Right Vocal cord polyp removal at [facility] [place], [place], 2013. Now with Right VC scarring. Followed previously by [facility] ENT.   Medication:   Outpatient Encounter Medications as of 10/17/2017  Medication Sig  . acetaminophen (TYLENOL) 500 MG tablet Take 500 mg by mouth daily as needed for mild pain or moderate pain.  [name] albuterol (PROAIR HFA) 108 (90 Base) MCG/ACT inhaler Inhale 2 puffs every 4 (four) hours as needed into the lungs for wheezing or shortness of breath.  [name] alendronate (FOSAMAX) 70 MG tablet Take 1 tablet (70 mg total) by mouth every 7 (seven) days. Take with a full glass of water on an empty stomach. (Patient taking differently: Take 70 mg by mouth every 7 (seven) days. Take with a full glass of water on an empty stomach. Either on Saturday or Sunday)  . ARIPiprazole (ABILIFY) 10 MG tablet Take 10 mg by mouth daily.  [name] aspirin EC 325 MG tablet Take 1 tablet (325 mg total) by mouth 2 (two)  times daily.  [name] buPROPion (WELLBUTRIN XL) 150 MG 24 hr tablet Take 150 mg by mouth daily.  . calcipotriene (DOVONOX) 0.005 % ointment Apply topically 2 (two) times daily. (Patient taking differently: Apply 1 application topically 2 (two) times daily as needed (psoriasis). )  . diazepam (VALIUM) 5 MG tablet Take 2.5 mg by mouth 3 (three) times daily.  . diclofenac sodium (VOLTAREN) 1 % GEL Apply 2 g topically 2 (two) times daily as needed (pain).  [name] esomeprazole (NEXIUM) 40 MG capsule TAKE 1  CAPSULE (40 MG TOTAL) DAILY BY MOUTH.  [name] esomeprazole (NEXIUM) 40 MG capsule TAKE 1 CAPSULE (40 MG TOTAL) DAILY BY MOUTH.  . gabapentin (NEURONTIN) 300 MG capsule Take 300 mg by mouth 2 (two) times daily.  [name] HYDROcodone-acetaminophen (NORCO) 7.5-325 MG tablet Take 1 tablet by mouth every 4 (four) hours as needed for moderate pain.  [name] HYDROcodone-acetaminophen (NORCO/VICODIN) 5-325 MG tablet Take 1-2 tablets by mouth every 4 (four) hours as needed for up to 365 doses for moderate pain.  . metFORMIN (GLUCOPHAGE-XR) 500 MG 24 hr tablet Take 1 tablet (500 mg total) by mouth daily with breakfast.  . methocarbamol (ROBAXIN) 500 MG tablet Take 500 mg by mouth 2 (two) times daily.  . ondansetron (ZOFRAN) 4 MG tablet Take 1 tablet (4 mg total) by mouth daily as needed for nausea or vomiting.  . rosuvastatin (CRESTOR) 10 MG tablet Take 1 tablet (10 mg total) by mouth at bedtime.  . senna (SENOKOT) 8.6 MG TABS tablet Take 1 tablet (8.6 mg total) by mouth 2 (two) times daily.  . solifenacin (VESICARE) 10 MG tablet Take 1 tablet (10 mg total) by mouth daily.  [name] STIOLTO RESPIMAT 2.5-2.5 MCG/ACT AERS INHALE 2 PUFFS INTO THE LUNGS DAILY.  . traZODone (DESYREL) 50 MG tablet Take 50 mg 2 (two) times daily by mouth.    No facility-administered encounter medications on file as of 10/17/2017.      Allergies:  Codeine; Haldol [haloperidol]; Haloperidol lactate; and Trileptal [oxcarbazepine]  Review of Systems: Gen:  Denies  fever, sweats. HEENT: Denies blurred vision. Cvc:  No dizziness, chest pain or heaviness Resp:   Denies cough or sputum porduction. Gi: Denies swallowing difficulty, stomach pain. constipation, bowel incontinence Gu:  Denies bladder incontinence, burning urine Ext:   No Joint pain, stiffness. Skin: No skin rash, easy bruising. Endoc:  No polyuria, polydipsia. Psych: No depression, insomnia. Other:  All other systems were reviewed and found to be negative other than what is mentioned in  the HPI.   Physical Examination:   VS: BP 108/78 (BP Location: Left Arm, Cuff Size: Normal)   Pulse 95   Resp 16   Ht 5\' 3"  (1.6 m)   Wt 187 lb (84.8 kg)   SpO2 93%   BMI 33.13 kg/m   General Appearance: No distress  Neuro:without focal findings,  speech normal,  HEENT: PERRLA, EOM intact. Pulmonary: normal breath sounds, No wheezing.   CardiovascularNormal S1,S2.  No m/r/g.   Abdomen: Benign, Soft, non-tender. Renal:  No costovertebral tenderness  GU:  Not performed at this time. Endoc: No evident thyromegaly, no signs of acromegaly. Skin:   warm, no rash. Extremities: normal, no cyanosis, clubbing.   LABORATORY PANEL:   CBC No results for input(s): WBC, HGB, HCT, PLT in the last 168 hours. ------------------------------------------------------------------------------------------------------------------  Chemistries  No results for input(s): NA, K, CL, CO2, GLUCOSE, BUN, CREATININE, CALCIUM, MG, AST, ALT, ALKPHOS, BILITOT in the last 168 hours.  Invalid input(s): GFRCGP ------------------------------------------------------------------------------------------------------------------  Cardiac Enzymes No results for input(s): TROPONINI in the last 168 hours. ------------------------------------------------------------  RADIOLOGY:   No results found for this or any previous visit. Results for orders placed during the hospital encounter of 08/15/16  DG Chest 2 View   Narrative CLINICAL DATA:  Cough and shortness of breath, 3 days duration. History of lung nodules.  EXAM: CHEST  2 VIEW  COMPARISON:  CT 12/10/2015.  Radiography 09/01/2015.  FINDINGS: Heart size is normal. There is aortic atherosclerosis. Mild pleural and parenchymal scarring in the lower lungs. No sign of active infiltrate, mass, effusion or collapse. Ordinary degenerative changes affect the spine. Previous cervical ACDF.  IMPRESSION: No active disease. Pleural and parenchymal scarring in the  lower chest.   Electronically Signed   By: Nelson Chimes M.D.   On: 08/15/2016 15:20    ------------------------------------------------------------------------------------------------------------------  Thank  you for allowing [facility] to assist in the care of your patient. Our recommendations are noted above.  Please contact us if we can be of further service.   Marda Stalker, MD.  [facility] Office Number: [phone]  Patricia Pesa, M.D.  Merton Border, M.D  10/16/2017

## 2017-10-17 ENCOUNTER — Encounter: Payer: Self-pay | Admitting: Internal Medicine

## 2017-10-17 ENCOUNTER — Ambulatory Visit (INDEPENDENT_AMBULATORY_CARE_PROVIDER_SITE_OTHER): Payer: Medicare Other | Admitting: Internal Medicine

## 2017-10-17 VITALS — BP 108/78 | HR 95 | Resp 16 | Ht 63.0 in | Wt 187.0 lb

## 2017-10-17 DIAGNOSIS — R053 Chronic cough: Secondary | ICD-10-CM

## 2017-10-17 DIAGNOSIS — J4530 Mild persistent asthma, uncomplicated: Secondary | ICD-10-CM | POA: Diagnosis not present

## 2017-10-17 DIAGNOSIS — R05 Cough: Secondary | ICD-10-CM

## 2017-10-17 NOTE — Patient Instructions (Signed)
Avoid trigger for asthma.  Continue current inhalers.  Increase activity level.

## 2017-12-27 ENCOUNTER — Other Ambulatory Visit: Payer: Self-pay | Admitting: Internal Medicine

## 2017-12-28 ENCOUNTER — Emergency Department: Payer: Medicare Other

## 2017-12-28 ENCOUNTER — Other Ambulatory Visit: Payer: Self-pay

## 2017-12-28 ENCOUNTER — Emergency Department
Admission: EM | Admit: 2017-12-28 | Discharge: 2017-12-28 | Disposition: A | Discharge status: Home / Self Care | Payer: Medicare Other | Attending: Student in an Organized Health Care Education/Training Program | Admitting: Student in an Organized Health Care Education/Training Program

## 2017-12-28 DIAGNOSIS — M069 Rheumatoid arthritis, unspecified: Secondary | ICD-10-CM | POA: Diagnosis not present

## 2017-12-28 DIAGNOSIS — Z7984 Long term (current) use of oral hypoglycemic drugs: Secondary | ICD-10-CM | POA: Diagnosis not present

## 2017-12-28 DIAGNOSIS — R2241 Localized swelling, mass and lump, right lower limb: Secondary | ICD-10-CM | POA: Diagnosis not present

## 2017-12-28 DIAGNOSIS — Z79899 Other long term (current) drug therapy: Secondary | ICD-10-CM | POA: Diagnosis not present

## 2017-12-28 DIAGNOSIS — Z96651 Presence of right artificial knee joint: Secondary | ICD-10-CM | POA: Diagnosis not present

## 2017-12-28 DIAGNOSIS — E119 Type 2 diabetes mellitus without complications: Secondary | ICD-10-CM | POA: Diagnosis not present

## 2017-12-28 DIAGNOSIS — F17200 Nicotine dependence, unspecified, uncomplicated: Secondary | ICD-10-CM | POA: Insufficient documentation

## 2017-12-28 DIAGNOSIS — M659 Synovitis and tenosynovitis, unspecified: Secondary | ICD-10-CM

## 2017-12-28 DIAGNOSIS — M25561 Pain in right knee: Secondary | ICD-10-CM | POA: Diagnosis present

## 2017-12-28 LAB — GLUCOSE, CAPILLARY: GLUCOSE-CAPILLARY: 84 mg/dL (ref 65–99)

## 2017-12-28 MED ORDER — MELOXICAM 15 MG PO TABS: 15.0000 mg | ORAL_TABLET | Freq: Every day | ORAL | 0 refills | Status: DC

## 2017-12-28 MED ORDER — PREDNISONE 10 MG PO TABS: ORAL_TABLET | ORAL | 0 refills | Status: DC

## 2017-12-28 MED ORDER — TRAMADOL HCL 50 MG PO TABS: 50.0000 mg | ORAL_TABLET | Freq: Four times a day (QID) | ORAL | 0 refills | Status: DC | PRN

## 2017-12-28 NOTE — ED Notes (Signed)
Travel last week to [place] on plane, but she walked a lot.

## 2017-12-28 NOTE — ED Provider Notes (Signed)
[facility] Emergency Department Provider Note  ____________________________________________   First MD Initiated Contact with Patient 12/28/17 1133     (approximate)  I have reviewed the triage vital signs and the nursing notes.   HISTORY  Chief Complaint Knee Pain   HPI Kathryn Ramirez is a 67 y.o. female is here with complaint of right knee pain.  Patient denies any recent injury.  Patient is worried about a blood clot as she is flying to [place] and back prior to her increased leg pain.  Patient states that pain is increased with walking.  Patient reports a total knee replacement in November by Dr. Sabra Heck.  She denies any fever, chills, nausea or vomiting.  She denies any injury to her knee.  She denies any previous history of DVT.  She rates her pain as 6 out of 10.   Past Medical History:  Diagnosis Date  . Acid reflux disease   . Asthma   . Bipolar 1 disorder ([place])   . Diabetes mellitus without complication ([place])   . Hypertension   . Liver hemangioma   . Lung nodule   . Psoriasis   . Rheumatoid arthritis ([place])   . Tardive dyskinesia   . Wears dentures    full upper and lower    Patient Active Problem List   Diagnosis Date Noted  . 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 Erroneous encounter - disregard 03/29/2017  . Heartburn   . Gastritis without bleeding   . Hx of colonic polyps   . Arthritis 09/02/2016  . Hep C w/o coma, chronic ([place]) 09/02/2016  . Hyperlipidemia, unspecified 09/02/2016  . Hypertension 09/02/2016  . Tardive dyskinesia 09/02/2016  . Urinary incontinence 09/02/2016  . History of hypothyroidism 08/19/2016  . Neck swelling 08/19/2016  . Acute asthma exacerbation 08/19/2016  . GERD (gastroesophageal reflux disease) 08/09/2016  . Dry mouth 06/01/2016  . Elevated antinuclear antibody (ANA) level 06/01/2016  . Generalized osteoarthritis of hand 06/01/2016  . Degenerative joint disease of right knee 06/01/2016  .  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 Chest pain 04/06/2016  . Drug-induced peripheral neuropathy ([place]) 03/25/2016  . Sensory ataxia 03/25/2016  . Wrist pain, chronic, left 03/25/2016  . Impairment of balance 02/05/2016  . Acute right-sided low back pain without sciatica 02/05/2016  . Normal blood pressure 01/23/2016  . Pulmonary infiltrates   . Chronic cough   . Pulmonary scarring 01/06/2016  . OAB (overactive bladder) 12/18/2015  . Asthma 09/18/2015  . Multiple pulmonary nodules 09/15/2015  . Baker's cyst of knee, right 07/02/2015  . Bipolar disorder with moderate depression ([place]) 12/19/2014  . Chronic pain 12/19/2014  . Carpal tunnel syndrome of left wrist 10/01/2014  . Chronic RUQ pain 09/19/2014  . Esophageal dysphagia 09/19/2014  . Hemangioma of liver 09/19/2014  . Facet arthropathy, cervical 08/06/2014  . Osteoarthritis of spine with radiculopathy, cervical region 07/12/2014  . Cervical myelopathy with cervical radiculopathy 03/24/2014  . Hyponatremia 02/21/2014  . Neck pain 09/18/2013  . 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 CATARACT EXTRACTION W/ INTRAOCULAR LENS  IMPLANT, BILATERAL    . CERVICAL SPINE SURGERY  2017   [facility]  . COLONOSCOPY WITH PROPOFOL N/A 09/14/2016   Procedure: COLONOSCOPY WITH PROPOFOL;  Surgeon: Lucilla Lame, MD;  Location: [facility] ENDOSCOPY;  Service: Endoscopy;  Laterality: N/A;  . ESOPHAGOGASTRODUODENOSCOPY (EGD) WITH PROPOFOL N/A 09/14/2016   Procedure:  ESOPHAGOGASTRODUODENOSCOPY (EGD) WITH PROPOFOL;  Surgeon: Lucilla Lame, MD;  Location: [facility] ENDOSCOPY;  Service: Endoscopy;  Laterality: N/A;  . FLEXIBLE BRONCHOSCOPY N/A 01/13/2016   Procedure: FLEXIBLE BRONCHOSCOPY;  Surgeon: Vilinda Boehringer, MD;  Location: [facility] ORS;  Service: Cardiopulmonary;  Laterality: N/A;  . NEUROPLASTY MAJOR NERVE    . POLYPECTOMY    . THROAT SURGERY    . TOTAL KNEE  ARTHROPLASTY Right 06/15/2017   Procedure: TOTAL KNEE ARTHROPLASTY;  Surgeon: Earnestine Leys, MD;  Location: [facility] ORS;  Service: Orthopedics;  Laterality: Right;    Prior to Admission medications   Medication Sig Start Date End Date Taking? Authorizing Provider  albuterol (PROAIR HFA) 108 (90 Base) MCG/ACT inhaler Inhale 2 puffs every 4 (four) hours as needed into the lungs for wheezing or shortness of breath. 06/02/17   Laverle Hobby, MD  alendronate (FOSAMAX) 70 MG tablet Take 1 tablet (70 mg total) by mouth every 7 (seven) days. Take with a full glass of water on an empty stomach. Patient taking differently: Take 70 mg by mouth every 7 (seven) days. Take with a full glass of water on an empty stomach. Either on Saturday or Sunday 01/20/17   Roselee Nova, MD  ARIPiprazole (ABILIFY) 10 MG tablet Take 10 mg by mouth daily.    [provider]  buPROPion (WELLBUTRIN XL) 150 MG 24 hr tablet Take 150 mg by mouth daily.    [provider]  calcipotriene (DOVONOX) 0.005 % ointment Apply topically 2 (two) times daily. Patient taking differently: Apply 1 application topically 2 (two) times daily as needed (psoriasis).  05/02/17   Roselee Nova, MD  diazepam (VALIUM) 5 MG tablet Take 2.5 mg by mouth 3 (three) times daily.    [provider]  diclofenac sodium (VOLTAREN) 1 % GEL Apply 2 g topically 2 (two) times daily as needed (pain).    [provider]  esomeprazole (NEXIUM) 40 MG capsule TAKE 1 CAPSULE (40 MG TOTAL) DAILY BY MOUTH. 06/29/17   Roselee Nova, MD  gabapentin (NEURONTIN) 300 MG capsule Take 300 mg by mouth 2 (two) times daily.    [provider]  metFORMIN (GLUCOPHAGE-XR) 500 MG 24 hr tablet Take 1 tablet (500 mg total) by mouth daily with breakfast. 10/03/17 01/01/18  Lada, Satira Anis, MD  methocarbamol (ROBAXIN) 500 MG tablet Take 500 mg by mouth 2 (two) times daily.    [provider]  predniSONE (DELTASONE) 10 MG tablet  Take 4 tablets once a day for 5 days 12/28/17   Letitia Neri L, PA-C  rosuvastatin (CRESTOR) 10 MG tablet Take 1 tablet (10 mg total) by mouth at bedtime. 10/03/17   Arnetha Courser, MD  senna (SENOKOT) 8.6 MG TABS tablet Take 1 tablet (8.6 mg total) by mouth 2 (two) times daily. 06/17/17   Lovell Sheehan, MD  solifenacin (VESICARE) 10 MG tablet Take 1 tablet (10 mg total) by mouth daily. 10/03/17   Lada, Satira Anis, MD  STIOLTO RESPIMAT 2.5-2.5 MCG/ACT AERS INHALE 2 PUFFS INTO THE LUNGS DAILY. 12/27/17   Laverle Hobby, MD  traMADol (ULTRAM) 50 MG tablet Take 1 tablet (50 mg total) by mouth every 6 (six) hours as needed. 12/28/17   Johnn Hai, PA-C  traZODone (DESYREL) 50 MG tablet Take 50 mg 2 (two) times daily by mouth.     [provider]    Allergies Codeine; Haldol [haloperidol]; Haloperidol lactate; and Trileptal [oxcarbazepine]  Family History  Problem Relation Age of  Onset  . Cancer Mother        Esophageal Ca  . Asthma Brother   . Breast cancer Neg Hx     Social History Social History   Tobacco Use  . Smoking status: Former Smoker    Types: E-cigarettes    Last attempt to quit: 09/19/2015    Years since quitting: 2.2  . Smokeless tobacco: Never Used  Substance Use Topics  . Alcohol use: No    Alcohol/week: 0.0 oz    Comment: 03/31/2012 sobierty   . Drug use: No    Review of Systems Constitutional: No fever/chills Cardiovascular: Denies chest pain. Respiratory: Denies shortness of breath. Gastrointestinal:   No nausea, no vomiting.   Musculoskeletal: Positive right knee pain.  Positive right leg pain with ambulation. Skin: Negative for rash. Neurological: Negative for headaches, focal weakness or numbness. ____________________________________________   PHYSICAL EXAM:  VITAL SIGNS: ED Triage Vitals  Enc Vitals Group     BP 12/28/17 1126 (!) 177/97     Pulse Rate 12/28/17 1126 86     Resp 12/28/17 1126 16     Temp 12/28/17 1126 98.2 F  (36.8 C)     Temp Source 12/28/17 1126 Oral     SpO2 12/28/17 1126 96 %     Weight 12/28/17 1127 181 lb (82.1 kg)     Height 12/28/17 1127 5\' 3"  (1.6 m)     Head Circumference --      Peak Flow --      Pain Score 12/28/17 1127 6     Pain Loc --      Pain Edu? --      Excl. in [place]? --    Constitutional: Alert and oriented. Well appearing and in no acute distress. Eyes: Conjunctivae are normal.  Head: Atraumatic. Neck: No stridor.   Cardiovascular: Normal rate, regular rhythm. Grossly normal heart sounds.  Good peripheral circulation. Respiratory: Normal respiratory effort.  No retractions. Lungs CTAB. Musculoskeletal:  Neurologic:  Normal speech and language. No gross focal neurologic deficits are appreciated.  Skin:  Skin is warm, dry and intact. No rash noted. Psychiatric: Mood and affect are normal. Speech and behavior are normal.  ____________________________________________   LABS (all labs ordered are listed, but only abnormal results are displayed)  Labs Reviewed  GLUCOSE, CAPILLARY  CBG MONITORING, ED    RADIOLOGY  ED MD interpretation:   Right knee x-ray is negative for fracture.  Questionable effusion.  Official radiology report(s): US Venous Img Lower Unilateral Right  Result Date: 12/28/2017 CLINICAL DATA:  Right lower extremity pain and swelling, recent travel EXAM: RIGHT LOWER EXTREMITY VENOUS DOPPLER ULTRASOUND TECHNIQUE: Gray-scale sonography with graded compression, as well as color Doppler and duplex ultrasound were performed to evaluate the lower extremity deep venous systems from the level of the common femoral vein and including the common femoral, femoral, profunda femoral, popliteal and calf veins including the posterior tibial, peroneal and gastrocnemius veins when visible. The superficial great saphenous vein was also interrogated. Spectral Doppler was utilized to evaluate flow at rest and with distal augmentation maneuvers in the common femoral,  femoral and popliteal veins. COMPARISON:  None. FINDINGS: Contralateral Common Femoral Vein: Respiratory phasicity is normal and symmetric with the symptomatic side. No evidence of thrombus. Normal compressibility. Common Femoral Vein: No evidence of thrombus. Normal compressibility, respiratory phasicity and response to augmentation. Saphenofemoral Junction: No evidence of thrombus. Normal compressibility and flow on color Doppler imaging. Profunda Femoral Vein: No evidence of thrombus. Normal compressibility  and flow on color Doppler imaging. Femoral Vein: No evidence of thrombus. Normal compressibility, respiratory phasicity and response to augmentation. Popliteal Vein: No evidence of thrombus. Normal compressibility, respiratory phasicity and response to augmentation. Calf Veins: No evidence of thrombus. Normal compressibility and flow on color Doppler imaging. Superficial Great Saphenous Vein: No evidence of thrombus. Normal compressibility. Venous Reflux:  None. Other Findings:  None. IMPRESSION: No evidence of deep venous thrombosis. Electronically Signed   By: Jerilynn Mages.  Shick M.D.   On: 12/28/2017 14:20   Dg Knee Complete 4 Views Right  Result Date: 12/28/2017 CLINICAL DATA:  Right knee pain. Right total knee arthroplasty in December 2018. EXAM: RIGHT KNEE - COMPLETE 4+ VIEW COMPARISON:  Radiographs 06/15/2017. FINDINGS: Status post right total knee arthroplasty. The hardware is intact without loosening. No evidence of acute fracture or dislocation. Fullness of the anterior soft tissues on the lateral view, suggesting the presence of a moderate-sized joint effusion and possible synovitis in Hoffa's fat. IMPRESSION: 1. No acute osseous findings or hardware loosening. 2. Anterior soft tissue thickening consistent with a joint effusion and possible synovitis in Hoffa's fat. Electronically Signed   By: Richardean Sale M.D.   On: 12/28/2017 12:08   ____________________________________________   PROCEDURES  Procedure(s) performed: None  Procedures  Critical Care performed: No  ____________________________________________   INITIAL IMPRESSION / ASSESSMENT AND PLAN / ED COURSE  As part of my medical decision making, I reviewed the following data within the electronic MEDICAL RECORD NUMBER Notes from prior ED visits and [place] Controlled Substance Database  Patient was reassured that there is no DVT seen on ultrasound.  We did discuss the synovitis that showed up on her x-ray.  Patient is type II diabetic and states her blood sugars are well controlled.  She is aware that being on prednisone for the next several days will call an increase in her glucose levels.  She is aware to be careful about her diet during this time.  He was also given a prescription for tramadol 50 mg 1 every 6 hours as needed for pain.  She is to follow-up with Dr. Sabra Heck or her PCP if any continued problems with her knee.  At this time patient declined knee immobilizer.  Ace wrap is to be used only when walking.  She is aware that she needs to remove this when sleeping.  ____________________________________________   FINAL CLINICAL IMPRESSION(S) / ED DIAGNOSES  Final diagnoses:  Synovitis of right knee     ED Discharge Orders        Ordered    meloxicam (MOBIC) 15 MG tablet  Daily,   Status:  Discontinued     12/28/17 1446    predniSONE (DELTASONE) 10 MG tablet     12/28/17 1453    traMADol (ULTRAM) 50 MG tablet  Every 6 hours PRN     12/28/17 1453       Note:  This document was prepared using Dragon voice recognition software and may include unintentional dictation errors.    Johnn Hai, PA-C 12/28/17 1538    Merlyn Lot, MD 12/29/17 0700

## 2017-12-28 NOTE — ED Notes (Signed)
Pt reports had a right knee replacement on 06/15/2017. Pt reports has had some pan so she was taking tramadol for that has since stopped. Pt reports now has a new pain that started a week ago to her right leg from her thigh all the way to her ankle. Pt reports pain is intermittent when sitting but constant when walking. Swelling noted to right knee. Pt states that this is normal. Slight redness noted to both knees, pt right knee warm to the touch.

## 2017-12-28 NOTE — Discharge Instructions (Addendum)
Follow-up with your primary care provider or Dr. Sabra Heck.  His contact information is listed on your discharge papers as well.  Also begin taking prednisone 4 tablets once a day for the next 5 days.  During this time watch your diet as this medication could cause your blood sugar to elevate.  Also tramadol 50 mg 1 every 6 hours as needed for moderate pain.  This medication could cause drowsiness and increase your risk for falling.  Wear Ace wrap for support only when walking.  You do not need to wear it while sleeping.  If not improving you will need to see Dr. Sabra Heck for your knee pain.

## 2017-12-28 NOTE — ED Notes (Signed)
First Nurse Note:  Patient states she had a knee replacement and now thinks she may have a "blood clot".

## 2017-12-28 NOTE — ED Triage Notes (Signed)
Pt arrives alert, oriented, in wheelchair. States November had knee replacement on R knee. C/o pain from thigh to ankle. Scar appears well healed. States that she hasn't called surgeon, states "I don't want to talk to him". States "I think I have a blood clot." no redness noted. Warmth equal on both legs. No edema noted. Took tylenol this AM.

## 2018-01-11 ENCOUNTER — Other Ambulatory Visit: Payer: Self-pay | Admitting: Internal Medicine

## 2018-01-11 MED ORDER — TIOTROPIUM BROMIDE-OLODATEROL 2.5-2.5 MCG/ACT IN AERS: 2.0000 | INHALATION_SPRAY | Freq: Every day | RESPIRATORY_TRACT | 2 refills | Status: DC

## 2018-01-27 ENCOUNTER — Ambulatory Visit (INDEPENDENT_AMBULATORY_CARE_PROVIDER_SITE_OTHER): Payer: Medicare Other | Admitting: Vascular Surgery

## 2018-01-27 ENCOUNTER — Encounter (INDEPENDENT_AMBULATORY_CARE_PROVIDER_SITE_OTHER): Payer: Self-pay | Admitting: Vascular Surgery

## 2018-01-27 VITALS — BP 145/85 | HR 85 | Resp 16 | Ht 63.75 in | Wt 189.8 lb

## 2018-01-27 DIAGNOSIS — M79604 Pain in right leg: Secondary | ICD-10-CM | POA: Diagnosis not present

## 2018-01-27 DIAGNOSIS — I10 Essential (primary) hypertension: Secondary | ICD-10-CM | POA: Diagnosis not present

## 2018-01-27 DIAGNOSIS — M79609 Pain in unspecified limb: Secondary | ICD-10-CM | POA: Insufficient documentation

## 2018-01-27 DIAGNOSIS — G2401 Drug induced subacute dyskinesia: Secondary | ICD-10-CM

## 2018-01-27 DIAGNOSIS — E785 Hyperlipidemia, unspecified: Secondary | ICD-10-CM

## 2018-01-27 DIAGNOSIS — M1990 Unspecified osteoarthritis, unspecified site: Secondary | ICD-10-CM

## 2018-01-27 NOTE — Patient Instructions (Signed)

## 2018-01-27 NOTE — Assessment & Plan Note (Signed)
lipid control important in reducing the progression of atherosclerotic disease. Continue statin therapy  

## 2018-01-27 NOTE — Assessment & Plan Note (Signed)
blood pressure control important in reducing the progression of atherosclerotic disease. On appropriate oral medications.  

## 2018-01-27 NOTE — Progress Notes (Signed)
Patient ID: COTY STUDENT, female   DOB: 03/04/1951, 67 y.o.   MRN: [id number]  Chief Complaint  Patient presents with  . New Patient (Initial Visit)    ref Alvie Heidelberg for varicose veins    HPI Kathryn Ramirez is a 67 y.o. female.  I am asked to see the patient by Dr. Alvie Heidelberg for evaluation of venous insufficiency.  Patient reports right leg pain and occasional swelling.  She had a knee replacement sometime last year.  Initially, she did well but then she said she began having more issues with pain and difficulty walking in the lower leg.  She was originally treated for possible orthopedic issues but then her orthopedic surgeon told her she had vein disease and not an orthopedic problem.  She does have some varicose veins in both legs more prominent on the right than the left.  She does complain of pain in the lower part of the legs as well as heaviness with activity and swelling.  Prednisone helped her pain.  She has been taking aspirin twice a day as well which also helps some.  No left leg symptoms.  No clear inciting event or causative factor that started the symptoms.  The patient has no previous history of deep venous thrombosis or superficial thrombophlebitis to their knowledge.     Past Medical History:  Diagnosis Date  . Acid reflux disease   . Asthma   . Bipolar 1 disorder ([place])   . Diabetes mellitus without complication ([place])   . Hypertension   . Liver hemangioma   . Lung nodule   . Psoriasis   . Rheumatoid arthritis ([place])   . Tardive dyskinesia   . Wears dentures    full upper and lower    Past Surgical History:  Procedure Laterality Date  . CATARACT EXTRACTION W/ INTRAOCULAR LENS  IMPLANT, BILATERAL    . CERVICAL SPINE SURGERY  2017   [facility] [place]  . COLONOSCOPY WITH PROPOFOL N/A 09/14/2016   Procedure: COLONOSCOPY WITH PROPOFOL;  Surgeon: Lucilla Lame, MD;  Location: [facility] END[facility]COPY;  Service: Endoscopy;  Laterality: N/A;  . ESOPHAGOGASTRODUODENOSCOPY (EGD)  WITH PROPOFOL N/A 09/14/2016   Procedure: ESOPHAGOGASTRODUODENOSCOPY (EGD) WITH PROPOFOL;  Surgeon: Lucilla Lame, MD;  Location: [facility] ENDOS[facility];  Service: Endoscopy;  Laterality: N/A;  . FLEXIBLE BRONCHOSCOPY N/A 01/13/2016   Procedure: FLEXIBLE BRONCHOSCOPY;  Surgeon: Vilinda Boehringer, MD;  Location: [facility] ORS;  Service: Cardiopulmonary;  Laterality: N/A;  . NEUROPLASTY MAJOR NERVE    . POLYPECTOMY    . THROAT SURGERY    . TOTAL KNEE ARTHROPLASTY Right 06/15/2017   Procedure: TOTAL KNEE ARTHROPLASTY;  Surgeon: Earnestine Leys, MD;  Location: [facility] ORS;  Service: Orthopedics;  Laterality: Right;    Family History  Problem Relation Age of Onset  . Cancer Mother        Esophageal Ca  . Asthma Brother   . Breast cancer Neg Hx   No bleeding or clotting disorders  Social History Social History   Tobacco Use  . Smoking status: Former Smoker    Types: E-cigarettes    Last attempt to quit: 09/19/2015    Years since quitting: 2.3  . Smokeless tobacco: Never Used  Substance Use Topics  . Alcohol use: No    Alcohol/week: 0.0 oz    Comment: 03/31/2012 sobierty   . Drug use: No    Allergies  Allergen Reactions  . Codeine Nausea Only  . Haldol [Haloperidol] Other (See Comments)    Reaction: Pt "  felt like her neck was going to snap." Sever neck stiffness. Reaction: Pt "felt like her neck was going to snap."  . Haloperidol Lactate Other (See Comments)    Sever neck stiffness.  . Trileptal [Oxcarbazepine] Swelling    Current Outpatient Medications  Medication Sig Dispense Refill  . albuterol (PROAIR HFA) 108 (90 Base) MCG/ACT inhaler Inhale 2 puffs every 4 (four) hours as needed into the lungs for wheezing or shortness of breath. 8.5 g 2  . alendronate (FOSAMAX) 70 MG tablet Take 1 tablet (70 mg total) by mouth every 7 (seven) days. Take with a full glass of water on an empty stomach. (Patient taking differently: Take 70 mg by mouth every 7 (seven) days. Take with a full glass of water on an  empty stomach. Either on Saturday or Sunday) 4 tablet 11  . ARIPiprazole (ABILIFY) 10 MG tablet Take 10 mg by mouth daily.    [name] buPROPion (WELLBUTRIN XL) 150 MG 24 hr tablet Take 150 mg by mouth daily.    . calcipotriene (DOVONOX) 0.005 % ointment Apply topically 2 (two) times daily. (Patient taking differently: Apply 1 application topically 2 (two) times daily as needed (psoriasis). ) 60 g 0  . diazepam (VALIUM) 5 MG tablet Take 2.5 mg by mouth 3 (three) times daily.    . diclofenac sodium (VOLTAREN) 1 % GEL Apply 2 g topically 2 (two) times daily as needed (pain).    [name] esomeprazole (NEXIUM) 40 MG capsule TAKE 1 CAPSULE (40 MG TOTAL) DAILY BY MOUTH. 30 capsule 0  . gabapentin (NEURONTIN) 300 MG capsule Take 300 mg by mouth 2 (two) times daily.    . methocarbamol (ROBAXIN) 500 MG tablet Take 500 mg by mouth 2 (two) times daily.    . rosuvastatin (CRESTOR) 10 MG tablet Take 1 tablet (10 mg total) by mouth at bedtime. 90 tablet 0  . senna (SENOKOT) 8.6 MG TABS tablet Take 1 tablet (8.6 mg total) by mouth 2 (two) times daily. 30 each 0  . solifenacin (VESICARE) 10 MG tablet Take 1 tablet (10 mg total) by mouth daily. 90 tablet 1  . Tiotropium Bromide-Olodaterol (STIOLTO RESPIMAT) 2.5-2.5 MCG/ACT AERS Inhale 2 puffs into the lungs daily. 12 g 2  . traZODone (DESYREL) 50 MG tablet Take 50 mg 2 (two) times daily by mouth.     . metFORMIN (GLUCOPHAGE-XR) 500 MG 24 hr tablet Take 1 tablet (500 mg total) by mouth daily with breakfast. 90 tablet 0  . predniSONE (DELTASONE) 10 MG tablet Take 4 tablets once a day for 5 days (Patient not taking: Reported on 01/27/2018) 20 tablet 0  . traMADol (ULTRAM) 50 MG tablet Take 1 tablet (50 mg total) by mouth every 6 (six) hours as needed. (Patient not taking: Reported on 01/27/2018) 10 tablet 0   No current facility-administered medications for this visit.       REVIEW OF SYSTEMS (Negative unless checked)  Constitutional: [] Weight loss  [] Fever  [] Chills Cardiac:  [] Chest pain   [] Chest pressure   [] Palpitations   [] Shortness of breath when laying flat   [] Shortness of breath at rest   [] Shortness of breath with exertion. Vascular:  [] Pain in legs with walking   [] Pain in legs at rest   [] Pain in legs when laying flat   [] Claudication   [] Pain in feet when walking  [] Pain in feet at rest  [] Pain in feet when laying flat   [] History of DVT   [] Phlebitis   [x] Swelling in legs   [  x]Varicose veins   [] Non-healing ulcers Pulmonary:   [] Uses home oxygen   [] Productive cough   [] Hemoptysis   [] Wheeze  [] COPD   [] Asthma Neurologic:  [] Dizziness  [] Blackouts   [] Seizures   [] History of stroke   [] History of TIA  [] Aphasia   [] Temporary blindness   [] Dysphagia   [] Weakness or numbness in arms   [] Weakness or numbness in legs Musculoskeletal:  [x] Arthritis   [] Joint swelling   [x] Joint pain   [] Low back pain Hematologic:  [] Easy bruising  [] Easy bleeding   [] Hypercoagulable state   [] Anemic  [] Hepatitis Gastrointestinal:  [] Blood in stool   [] Vomiting blood  [x] Gastroesophageal reflux/heartburn   [] Abdominal pain Genitourinary:  [] Chronic kidney disease   [] Difficult urination  [] Frequent urination  [] Burning with urination   [] Hematuria Skin:  [] Rashes   [] Ulcers   [] Wounds Psychological:  [x] History of anxiety   []  History of major depression.    Physical Exam BP (!) 145/85 (BP Location: Right Arm)   Pulse 85   Resp 16   Ht 5' 3.75" (1.619 m)   Wt 189 lb 12.8 oz (86.1 kg)   BMI 32.84 kg/m  Gen:  WD/WN, NAD Head: [place]/AT, No temporalis wasting.  Ear/Nose/Throat: Hearing grossly intact, dentures present Eyes: Sclera non-icteric. Conjunctiva clear Neck: Supple. Trachea midline Pulmonary:  Good air movement, no use of accessory muscles, respirations not labored.  Cardiac: RRR, No JVD Vascular: Varicosities diffuse and measuring up to 1-2 mm in the right lower extremity        Varicosities scattered and measuring up to 1-2 mm in the left lower extremity Vessel  Right Left  Radial Palpable Palpable                          PT Palpable Palpable  DP Palpable Palpable   Gastrointestinal: soft, non-tender/non-distended.  Musculoskeletal: M/S 5/5 throughout.   Trace RLE edema.  No LLE edema Neurologic: Sensation grossly intact in extremities.  Symmetrical.  Speech is slow and flat. Pill rolling present Psychiatric: Judgment and insight seem ok, very flat affect Dermatologic: No rashes or ulcers noted.  No cellulitis or open wounds.    Radiology US Venous Img Lower Unilateral Right  Result Date: 12/28/2017 CLINICAL DATA:  Right lower extremity pain and swelling, recent travel EXAM: RIGHT LOWER EXTREMITY VENOUS DOPPLER ULTRASOUND TECHNIQUE: Gray-scale sonography with graded compression, as well as color Doppler and duplex ultrasound were performed to evaluate the lower extremity deep venous systems from the level of the common femoral vein and including the common femoral, femoral, profunda femoral, popliteal and calf veins including the posterior tibial, peroneal and gastrocnemius veins when visible. The superficial great saphenous vein was also interrogated. Spectral Doppler was utilized to evaluate flow at rest and with distal augmentation maneuvers in the common femoral, femoral and popliteal veins. COMPARISON:  None. FINDINGS: Contralateral Common Femoral Vein: Respiratory phasicity is normal and symmetric with the symptomatic side. No evidence of thrombus. Normal compressibility. Common Femoral Vein: No evidence of thrombus. Normal compressibility, respiratory phasicity and response to augmentation. Saphenofemoral Junction: No evidence of thrombus. Normal compressibility and flow on color Doppler imaging. Profunda Femoral Vein: No evidence of thrombus. Normal compressibility and flow on color Doppler imaging. Femoral Vein: No evidence of thrombus. Normal compressibility, respiratory phasicity and response to augmentation. Popliteal Vein: No evidence of  thrombus. Normal compressibility, respiratory phasicity and response to augmentation. Calf Veins: No evidence of thrombus. Normal compressibility and flow on color Doppler imaging. Superficial  Great Saphenous Vein: No evidence of thrombus. Normal compressibility. Venous Reflux:  None. Other Findings:  None. IMPRESSION: No evidence of deep venous thrombosis. Electronically Signed   By: Jerilynn Mages.  Shick M.D.   On: 12/28/2017 14:20    Labs Recent Results (from the past 2160 hour(s))  Glucose, capillary     Status: None   Collection Time: 12/28/17  2:43 PM  Result Value Ref Range   Glucose-Capillary 84 65 - 99 mg/dL    Assessment/Plan:  Hypertension blood pressure control important in reducing the progression of atherosclerotic disease. On appropriate oral medications.   Hyperlipidemia, unspecified lipid control important in reducing the progression of atherosclerotic disease. Continue statin therapy   Tardive dyskinesia Sees neurology.  Has some pill rolling and a flat affect  Arthritis May be responsible for her LE symptoms.   Pain in limb The patient describes lower extremity symptoms that are not entirely clear in their etiology.  Certainly, venous insufficiency is in the differential.  She has already been prescribed compression stockings by her primary care physician and has started wearing these recently.  I have recommended she have a 76-month trial of conservative therapy with compression stockings, leg elevation, increasing activity, and anti-inflammatories as needed for pain.  I discussed the natural history and pathophysiology of venous insufficiency.  I will see the patient back in 3 months with a venous reflux study to discuss the results and determine further treatment options.       Leotis Pain 01/27/2018, 11:46 AM   This note was created with Dragon medical transcription system.  Any errors from dictation are unintentional.

## 2018-01-27 NOTE — Assessment & Plan Note (Signed)
May be responsible for her LE symptoms.

## 2018-01-27 NOTE — Assessment & Plan Note (Signed)
Sees neurology.  Has some pill rolling and a flat affect

## 2018-01-27 NOTE — Assessment & Plan Note (Signed)
The patient describes lower extremity symptoms that are not entirely clear in their etiology.  Certainly, venous insufficiency is in the differential.  She has already been prescribed compression stockings by her primary care physician and has started wearing these recently.  I have recommended she have a 26-month trial of conservative therapy with compression stockings, leg elevation, increasing activity, and anti-inflammatories as needed for pain.  I discussed the natural history and pathophysiology of venous insufficiency.  I will see the patient back in 3 months with a venous reflux study to discuss the results and determine further treatment options.

## 2018-02-08 ENCOUNTER — Telehealth (INDEPENDENT_AMBULATORY_CARE_PROVIDER_SITE_OTHER): Payer: Self-pay

## 2018-02-08 NOTE — Telephone Encounter (Signed)
I called the new authorization into the patient's pharmacy on file. I called her and let her know that it had been called in, and she was pleased.

## 2018-02-08 NOTE — Telephone Encounter (Signed)
Patient called and stated that she would like some Tramadol to help control the pain in her legs until she comes back in to see you in three months?

## 2018-02-16 ENCOUNTER — Other Ambulatory Visit: Payer: Self-pay | Admitting: Internal Medicine

## 2018-03-13 ENCOUNTER — Other Ambulatory Visit (INDEPENDENT_AMBULATORY_CARE_PROVIDER_SITE_OTHER): Payer: Self-pay | Admitting: Vascular Surgery

## 2018-03-28 ENCOUNTER — Encounter

## 2018-03-28 ENCOUNTER — Encounter (INDEPENDENT_AMBULATORY_CARE_PROVIDER_SITE_OTHER): Payer: Self-pay | Admitting: Vascular Surgery

## 2018-03-28 ENCOUNTER — Ambulatory Visit (INDEPENDENT_AMBULATORY_CARE_PROVIDER_SITE_OTHER): Payer: Medicare Other

## 2018-03-28 ENCOUNTER — Ambulatory Visit (INDEPENDENT_AMBULATORY_CARE_PROVIDER_SITE_OTHER): Payer: Medicare Other | Admitting: Vascular Surgery

## 2018-03-28 VITALS — BP 122/81 | HR 88 | Resp 16 | Ht 63.75 in | Wt 192.0 lb

## 2018-03-28 DIAGNOSIS — M1990 Unspecified osteoarthritis, unspecified site: Secondary | ICD-10-CM

## 2018-03-28 DIAGNOSIS — I10 Essential (primary) hypertension: Secondary | ICD-10-CM | POA: Diagnosis not present

## 2018-03-28 DIAGNOSIS — M79604 Pain in right leg: Secondary | ICD-10-CM

## 2018-03-28 NOTE — Assessment & Plan Note (Signed)
Her venous study today was normal with no evidence of DVT, superficial thrombophlebitis, or venous reflux identified. It does not appears if her lower extremity symptoms are vascular in nature.  She will discuss this with her primary care physician.  I will see her back as needed.

## 2018-03-28 NOTE — Progress Notes (Signed)
MRN : [id number]  Kathryn Ramirez is a 67 y.o. (09/09/1950) female who presents with chief complaint of  Chief Complaint  Patient presents with  . Follow-up    78month reflux  .  History of Present Illness: Patient returns today in follow up of leg pain.  No major changes since her last visit a couple of months ago.  No new symptoms.  Her venous study today was normal with no evidence of DVT, superficial thrombophlebitis, or venous reflux identified.  Current Outpatient Medications  Medication Sig Dispense Refill  . alendronate (FOSAMAX) 70 MG tablet Take 1 tablet (70 mg total) by mouth every 7 (seven) days. Take with a full glass of water on an empty stomach. (Patient taking differently: Take 70 mg by mouth every 7 (seven) days. Take with a full glass of water on an empty stomach. Either on Saturday or Sunday) 4 tablet 11  . ARIPiprazole (ABILIFY) 10 MG tablet Take 10 mg by mouth daily.    [name] buPROPion (WELLBUTRIN XL) 150 MG 24 hr tablet Take 150 mg by mouth daily.    . calcipotriene (DOVONOX) 0.005 % ointment Apply topically 2 (two) times daily. (Patient taking differently: Apply 1 application topically 2 (two) times daily as needed (psoriasis). ) 60 g 0  . diazepam (VALIUM) 5 MG tablet Take 2.5 mg by mouth 3 (three) times daily.    . diclofenac sodium (VOLTAREN) 1 % GEL Apply 2 g topically 2 (two) times daily as needed (pain).    [name] esomeprazole (NEXIUM) 40 MG capsule TAKE 1 CAPSULE (40 MG TOTAL) DAILY BY MOUTH. 30 capsule 0  . gabapentin (NEURONTIN) 300 MG capsule Take 300 mg by mouth 2 (two) times daily.    . methocarbamol (ROBAXIN) 500 MG tablet Take 500 mg by mouth 2 (two) times daily.    . rosuvastatin (CRESTOR) 10 MG tablet Take 1 tablet (10 mg total) by mouth at bedtime. 90 tablet 0  . senna (SENOKOT) 8.6 MG TABS tablet Take 1 tablet (8.6 mg total) by mouth 2 (two) times daily. 30 each 0  . solifenacin (VESICARE) 10 MG tablet Take 1 tablet (10 mg total) by mouth daily. 90 tablet  1  . Tiotropium Bromide-Olodaterol (STIOLTO RESPIMAT) 2.5-2.5 MCG/ACT AERS Inhale 2 puffs into the lungs daily. 12 g 2  . traZODone (DESYREL) 50 MG tablet Take 50 mg 2 (two) times daily by mouth.     . VENTOLIN HFA 108 (90 Base) MCG/ACT inhaler INHALE 2 PUFFS EVERY FOUR HOURS AS NEEDED INTO THE LUNGS FOR WHEEZING OR SHORTNESS OF BREATH 18 g 2  . metFORMIN (GLUCOPHAGE-XR) 500 MG 24 hr tablet Take 1 tablet (500 mg total) by mouth daily with breakfast. 90 tablet 0  . predniSONE (DELTASONE) 10 MG tablet Take 4 tablets once a day for 5 days (Patient not taking: Reported on 01/27/2018) 20 tablet 0  . traMADol (ULTRAM) 50 MG tablet TAKE 1 TABLET BY MOUTH EVERY 8 HOURS AS NEEDED FOR PAIN (Patient not taking: Reported on 03/28/2018) 40 tablet 0   No current facility-administered medications for this visit.    Past Medical History:  Diagnosis Date  . Acid reflux disease   . Asthma   . Bipolar 1 disorder ([place])   . Diabetes mellitus without complication ([place])   . Hypertension   . Liver hemangioma   . Lung nodule   . Psoriasis   . Rheumatoid arthritis ([place])   . Tardive dyskinesia   . Wears dentures  full upper and lower         Past Surgical History:  Procedure Laterality Date  . CATARACT EXTRACTION W/ INTRAOCULAR LENS  IMPLANT, BILATERAL    . CERVICAL SPINE SURGERY  2017   [facility] [place]  . COLONOSCOPY WITH PROPOFOL N/A 09/14/2016   Procedure: COLONOSCOPY WITH PROPOFOL;  Surgeon: Lucilla Lame, MD;  Location: [facility];  Service: Endoscopy;  Laterality: N/A;  . ESOPHAGOGASTRODUODENOSCOPY (EGD) WITH PROPOFOL N/A 09/14/2016   Procedure: ESOPHAGOGASTRODUODENOSCOPY (EGD) WITH PROPOFOL;  Surgeon: Lucilla Lame, MD;  Location: [facility];  Service: Endoscopy;  Laterality: N/A;  . FLEXIBLE BRONCHOSCOPY N/A 01/13/2016   Procedure: FLEXIBLE BRONCHOSCOPY;  Surgeon: Vilinda Boehringer, MD;  Location: [facility];  Service: Cardiopulmonary;  Laterality: N/A;  . NEUROPLASTY MAJOR NERVE     . POLYPECTOMY    . THROAT SURGERY    . TOTAL KNEE ARTHROPLASTY Right 06/15/2017   Procedure: TOTAL KNEE ARTHROPLASTY;  Surgeon: Earnestine Leys, MD;  Location: [facility];  Service: Orthopedics;  Laterality: Right;         Family History  Problem Relation Age of Onset  . Cancer Mother        Esophageal Ca  . Asthma Brother   . Breast cancer Neg Hx   No bleeding or clotting disorders  Social History Social History        Tobacco Use  . Smoking status: Former Smoker    Types: E-cigarettes    Last attempt to quit: 09/19/2015    Years since quitting: 2.3  . Smokeless tobacco: Never Used  Substance Use Topics  . Alcohol use: No    Alcohol/week: 0.0 oz    Comment: 03/31/2012 sobierty   . Drug use: No         Allergies  Allergen Reactions  . Codeine Nausea Only  . Haldol [Haloperidol] Other (See Comments)    Reaction: Pt "felt like her neck was going to snap." Sever neck stiffness. Reaction: Pt "felt like her neck was going to snap."  . Haloperidol Lactate Other (See Comments)    Sever neck stiffness.  . Trileptal [Oxcarbazepine] Swelling      REVIEW OF SYSTEMS (Negative unless checked)  Constitutional: [] Weight loss  [] Fever  [] Chills Cardiac: [] Chest pain   [] Chest pressure   [] Palpitations   [] Shortness of breath when laying flat   [] Shortness of breath at rest   [] Shortness of breath with exertion. Vascular:  [] Pain in legs with walking   [] Pain in legs at rest   [] Pain in legs when laying flat   [] Claudication   [] Pain in feet when walking  [] Pain in feet at rest  [] Pain in feet when laying flat   [] History of DVT   [] Phlebitis   [x] Swelling in legs   [x] Varicose veins   [] Non-healing ulcers Pulmonary:   [] Uses home oxygen   [] Productive cough   [] Hemoptysis   [] Wheeze  [] COPD   [] Asthma Neurologic:  [] Dizziness  [] Blackouts   [] Seizures   [] History of stroke   [] History of TIA  [] Aphasia   [] Temporary blindness   [] Dysphagia    [] Weakness or numbness in arms   [] Weakness or numbness in legs Musculoskeletal:  [x] Arthritis   [] Joint swelling   [x] Joint pain   [] Low back pain Hematologic:  [] Easy bruising  [] Easy bleeding   [] Hypercoagulable state   [] Anemic  [] Hepatitis Gastrointestinal:  [] Blood in stool   [] Vomiting blood  [x] Gastroesophageal reflux/heartburn   [] Abdominal pain Genitourinary:  [] Chronic kidney disease   [] Difficult urination  []   Frequent urination  [] Burning with urination   [] Hematuria Skin:  [] Rashes   [] Ulcers   [] Wounds Psychological:  [x] History of anxiety   []  History of major depression.     Physical Examination  BP 122/81 (BP Location: Left Arm)   Pulse 88   Resp 16   Ht 5' 3.75" (1.619 m)   Wt 192 lb (87.1 kg)   BMI 33.22 kg/m  Gen:  WD/WN, NAD Head: [place]/AT, No temporalis wasting. Ear/Nose/Throat: Hearing grossly intact, nares w/o erythema or drainage Eyes: Conjunctiva clear. Sclera non-icteric Neck: Supple.  Trachea midline Pulmonary:  Good air movement, no use of accessory muscles.  Cardiac: RRR, no JVD Vascular:  Vessel Right Left  Radial Palpable Palpable                          PT Palpable Palpable  DP Palpable Palpable    Musculoskeletal: M/S 5/5 throughout.  No deformity or atrophy. Trace RLE edema. Neurologic: Sensation grossly intact in extremities.  Symmetrical.   Psychiatric: Judgment and insight are ok, flat affect Dermatologic: No rashes or ulcers noted.  No cellulitis or open wounds.       Labs Recent Results (from the past 2160 hour(s))  Glucose, capillary     Status: None   Collection Time: 12/28/17  2:43 PM  Result Value Ref Range   Glucose-Capillary 84 65 - 99 mg/dL    Radiology No results found.  Assessment/Plan Hypertension blood pressure control important in reducing the progression of atherosclerotic disease. On appropriate oral medications.   Hyperlipidemia, unspecified lipid control important in reducing the progression of  atherosclerotic disease. Continue statin therapy   Tardive dyskinesia Sees neurology.  Has some pill rolling and a flat affect  Arthritis May be responsible for her LE symptoms.   No problem-specific Assessment & Plan notes found for this encounter.    Leotis Pain, MD  03/28/2018 10:23 AM    This note was created with Dragon medical transcription system.  Any errors from dictation are purely unintentional

## 2018-04-18 ENCOUNTER — Ambulatory Visit (INDEPENDENT_AMBULATORY_CARE_PROVIDER_SITE_OTHER): Payer: Medicare Other | Admitting: Internal Medicine

## 2018-04-18 VITALS — BP 122/72 | HR 74 | Resp 16 | Ht 63.6 in | Wt 191.0 lb

## 2018-04-18 DIAGNOSIS — J4530 Mild persistent asthma, uncomplicated: Secondary | ICD-10-CM | POA: Diagnosis not present

## 2018-04-18 MED ORDER — TIOTROPIUM BROMIDE-OLODATEROL 2.5-2.5 MCG/ACT IN AERS: 2.0000 | INHALATION_SPRAY | Freq: Every day | RESPIRATORY_TRACT | 2 refills | Status: DC

## 2018-04-18 NOTE — Patient Instructions (Addendum)
Continue to use Stiolto 2 puffs once daily. Can try cutting back to 1 puff once daily-- if you notice your breathing gets worse, go back to 2 puffs once daily.

## 2018-04-18 NOTE — Progress Notes (Signed)
* [facility]     Assessment and Plan:  The patient is a 67 year old female with a history of chronic bronchitis, persistent asthma, vocal cord polyps, GERD, hoarseness of voice.  Asthma. --Currently well controlled with no exacerbations. --Continue to avoid known triggers, asked to try to cut down Stiolto to 1 puff once daily, and continue that way if tolerated.   "Polyps and hoarseness of voice. --These can be contributed by qvar, therefore should stop this, and continue with stiolto and nexium.  --Continue to follow up with ENT.   GERD. -GERD symptoms appear to be controlled by Nexium. - Continue Nexium.  Lung nodules. --Per history she was followed at [facility] and told that she had lung nodules. Most recent CT chest 11/2015 showed no evidence of nodules. No need for further workup.    Date: 04/18/2018  MRN# [id number] Kathryn Ramirez 18-Apr-1951   Kathryn Ramirez is a 67 y.o. old female seen in follow up for chief complaint of  Chief Complaint  Patient presents with  . Asthma    pt states asthma is controlled. She accidentally threw her Stioloto inhaler away therefore will be out for the month.     HPI:  Patient is a pleasant 67 year old female past medical history of bipolar, tardive dyskinesia, rheumatoid arthritis, hypertension, multiple pulmonary nodules, asthma, acid reflux disease, obesity. She has a history of vocal cord polyps, followed by [facility]. She is a former smoker quit in August 2016, previously smoked one pack per day for 30 years. At last visit she was having some persistent hoarseness of voice, which persisted after stopping Qvar.    She was asked to follow-up with ENT.  She was asked to continue Stiolto, Nexium for GERD. She accidentally threw out her Stiolto. She feels that her breathing has been doing well, no flare-up of acute bronchitis. Sometimes she has dyspnea when she walks 15 minutes or more.   She visits her daughter twice per  week who has a dog and a cat. She no longer has symptoms of reflux and she is taking nexium.  **bronchoscopy 01/13/16 due to chronic cough - negative BAL **PFT on 12/11/15; Slightly reduced FEV1, normal FVC, ratio, RV, slightly increased RV/TLC, c/w mild air trapping. Normal DLCO, normal flow volume loops.  **Hx of Right Vocal cord polyp removal at [facility] [place], [place], 2013. Now with Right VC scarring. Followed previously by [facility].   Medication:   Outpatient Encounter Medications as of 04/18/2018  Medication Sig  . alendronate (FOSAMAX) 70 MG tablet Take 1 tablet (70 mg total) by mouth every 7 (seven) days. Take with a full glass of water on an empty stomach. (Patient taking differently: Take 70 mg by mouth every 7 (seven) days. Take with a full glass of water on an empty stomach. Either on Saturday or Sunday)  . ARIPiprazole (ABILIFY) 10 MG tablet Take 10 mg by mouth daily.  [name] buPROPion (WELLBUTRIN XL) 150 MG 24 hr tablet Take 150 mg by mouth daily.  . calcipotriene (DOVONOX) 0.005 % ointment Apply topically 2 (two) times daily. (Patient taking differently: Apply 1 application topically 2 (two) times daily as needed (psoriasis). )  . diazepam (VALIUM) 5 MG tablet Take 2.5 mg by mouth 3 (three) times daily.  . diclofenac sodium (VOLTAREN) 1 % GEL Apply 2 g topically 2 (two) times daily as needed (pain).  [name] esomeprazole (NEXIUM) 40 MG capsule TAKE 1 CAPSULE (40 MG TOTAL) DAILY BY MOUTH.  . gabapentin (  NEURONTIN) 300 MG capsule Take 300 mg by mouth 2 (two) times daily.  . metFORMIN (GLUCOPHAGE-XR) 500 MG 24 hr tablet Take 1 tablet (500 mg total) by mouth daily with breakfast.  . methocarbamol (ROBAXIN) 500 MG tablet Take 500 mg by mouth 2 (two) times daily.  . predniSONE (DELTASONE) 10 MG tablet Take 4 tablets once a day for 5 days (Patient not taking: Reported on 01/27/2018)  . rosuvastatin (CRESTOR) 10 MG tablet Take 1 tablet (10 mg total) by mouth at bedtime.  . senna (SENOKOT) 8.6 MG  TABS tablet Take 1 tablet (8.6 mg total) by mouth 2 (two) times daily.  . solifenacin (VESICARE) 10 MG tablet Take 1 tablet (10 mg total) by mouth daily.  . Tiotropium Bromide-Olodaterol (STIOLTO RESPIMAT) 2.5-2.5 MCG/ACT AERS Inhale 2 puffs into the lungs daily.  . traMADol (ULTRAM) 50 MG tablet TAKE 1 TABLET BY MOUTH EVERY 8 HOURS AS NEEDED FOR PAIN (Patient not taking: Reported on 03/28/2018)  . traZODone (DESYREL) 50 MG tablet Take 50 mg 2 (two) times daily by mouth.   . VENTOLIN HFA 108 (90 Base) MCG/ACT inhaler INHALE 2 PUFFS EVERY FOUR HOURS AS NEEDED INTO THE LUNGS FOR WHEEZING OR SHORTNESS OF BREATH   No facility-administered encounter medications on file as of 04/18/2018.     Review of Systems:  Constitutional: Feels well. Cardiovascular: Denies chest pain, exertional chest pain.  Pulmonary: Denies hemoptysis, pleuritic chest pain.   The remainder of systems were reviewed and were found to be negative other than what is documented in the HPI.    Physical Examination:   VS: BP 122/72 (BP Location: Left Arm, Cuff Size: Normal)   Pulse 74   Resp 16   Ht 5' 3.6" (1.615 m)   Wt 191 lb (86.6 kg)   SpO2 95%   BMI 33.20 kg/m   General Appearance: No distress  Neuro:without focal findings, mental status, speech normal, alert and oriented HEENT: PERRLA, EOM intact Pulmonary: No wheezing, No rales  CardiovascularNormal S1,S2.  No m/r/g.  Abdomen: Benign, Soft, non-tender, No masses Renal:  No costovertebral tenderness  GU:  No performed at this time. Endoc: No evident thyromegaly, no signs of acromegaly or Cushing features Skin:   warm, no rashes, no ecchymosis  Extremities: normal, no cyanosis, clubbing.    Allergies:  Codeine; Haldol [haloperidol]; Haloperidol lactate; and Trileptal [oxcarbazepine]   LABORATORY PANEL:   CBC No results for input(s): WBC, HGB, HCT, PLT in the last 168  hours. ------------------------------------------------------------------------------------------------------------------  Chemistries  No results for input(s): NA, K, CL, CO2, GLUCOSE, BUN, CREATININE, CALCIUM, MG, AST, ALT, ALKPHOS, BILITOT in the last 168 hours.  Invalid input(s): GFRCGP ------------------------------------------------------------------------------------------------------------------  Cardiac Enzymes No results for input(s): TROPONINI in the last 168 hours. ------------------------------------------------------------  RADIOLOGY:   No results found for this or any previous visit. Results for orders placed during the hospital encounter of 08/15/16  DG Chest 2 View   Narrative CLINICAL DATA:  Cough and shortness of breath, 3 days duration. History of lung nodules.  EXAM: CHEST  2 VIEW  COMPARISON:  CT 12/10/2015.  Radiography 09/01/2015.  FINDINGS: Heart size is normal. There is aortic atherosclerosis. Mild pleural and parenchymal scarring in the lower lungs. No sign of active infiltrate, mass, effusion or collapse. Ordinary degenerative changes affect the spine. Previous cervical ACDF.  IMPRESSION: No active disease. Pleural and parenchymal scarring in the lower chest.   Electronically Signed   By: Nelson Chimes M.D.   On: 08/15/2016 15:20    ------------------------------------------------------------------------------------------------------------------  Thank  you for allowing [facility] to assist in the care of your patient. Our recommendations are noted above.  Please contact us if we can be of further service.  Marda Stalker, M.D., F.C.C.P.  Board Certified in Internal Medicine, Pulmonary Medicine, [place], and Sleep Medicine.  [facility] Office Number: [phone]   04/18/2018

## 2018-07-28 ENCOUNTER — Encounter (INDEPENDENT_AMBULATORY_CARE_PROVIDER_SITE_OTHER): Payer: Medicare Other

## 2018-07-28 ENCOUNTER — Ambulatory Visit (INDEPENDENT_AMBULATORY_CARE_PROVIDER_SITE_OTHER): Payer: Medicare Other | Admitting: Vascular Surgery

## 2018-09-15 ENCOUNTER — Telehealth: Payer: Self-pay | Admitting: Internal Medicine

## 2018-09-15 NOTE — Telephone Encounter (Signed)
As explained to patient in detailed call on yesterday.  Per covermymeds her Kathryn Ramirez is still showing inactive. PA request was sent to [facility] Medicaid to see if they will approve. Nothing further needed at this time.

## 2018-09-19 ENCOUNTER — Other Ambulatory Visit: Payer: Self-pay

## 2018-09-19 MED ORDER — ALBUTEROL SULFATE HFA 108 (90 BASE) MCG/ACT IN AERS: 2.0000 | INHALATION_SPRAY | RESPIRATORY_TRACT | 1 refills | Status: DC | PRN

## 2018-09-19 MED ORDER — TIOTROPIUM BROMIDE-OLODATEROL 2.5-2.5 MCG/ACT IN AERS: 2.0000 | INHALATION_SPRAY | Freq: Every day | RESPIRATORY_TRACT | 1 refills | Status: DC

## 2018-09-21 ENCOUNTER — Encounter: Payer: Self-pay | Admitting: Internal Medicine

## 2018-09-21 ENCOUNTER — Other Ambulatory Visit: Payer: Self-pay | Admitting: Internal Medicine

## 2018-09-21 NOTE — Telephone Encounter (Signed)
Error

## 2018-09-22 ENCOUNTER — Telehealth: Payer: Self-pay | Admitting: Internal Medicine

## 2018-09-22 MED ORDER — TIOTROPIUM BROMIDE-OLODATEROL 2.5-2.5 MCG/ACT IN AERS: 2.0000 | INHALATION_SPRAY | Freq: Every day | RESPIRATORY_TRACT | 1 refills | Status: DC

## 2018-09-22 NOTE — Telephone Encounter (Signed)
Returned call to [employer]. Explained to pharmacist rx refill submitted. We have been working on a PA for this patient since last week. It is showing that her insurance is inactive. She faxed over her [facility] card with same ID #. Per covermymeds this plan is inactive. Pt made aware that she needs to call plan to find out what is going on. After running a claim pharmacist states its too early to fill and she must be filling elsewhere. Nothing further needed.

## 2018-10-08 IMAGING — DX DG KNEE 1-2V PORT*R*
2 series · 2 of 2 positions shown · non-contrast
Comparison: 07/03/2015

CLINICAL DATA: Postop

EXAM:
PORTABLE RIGHT KNEE - 1-2 VIEW

[knee ap]
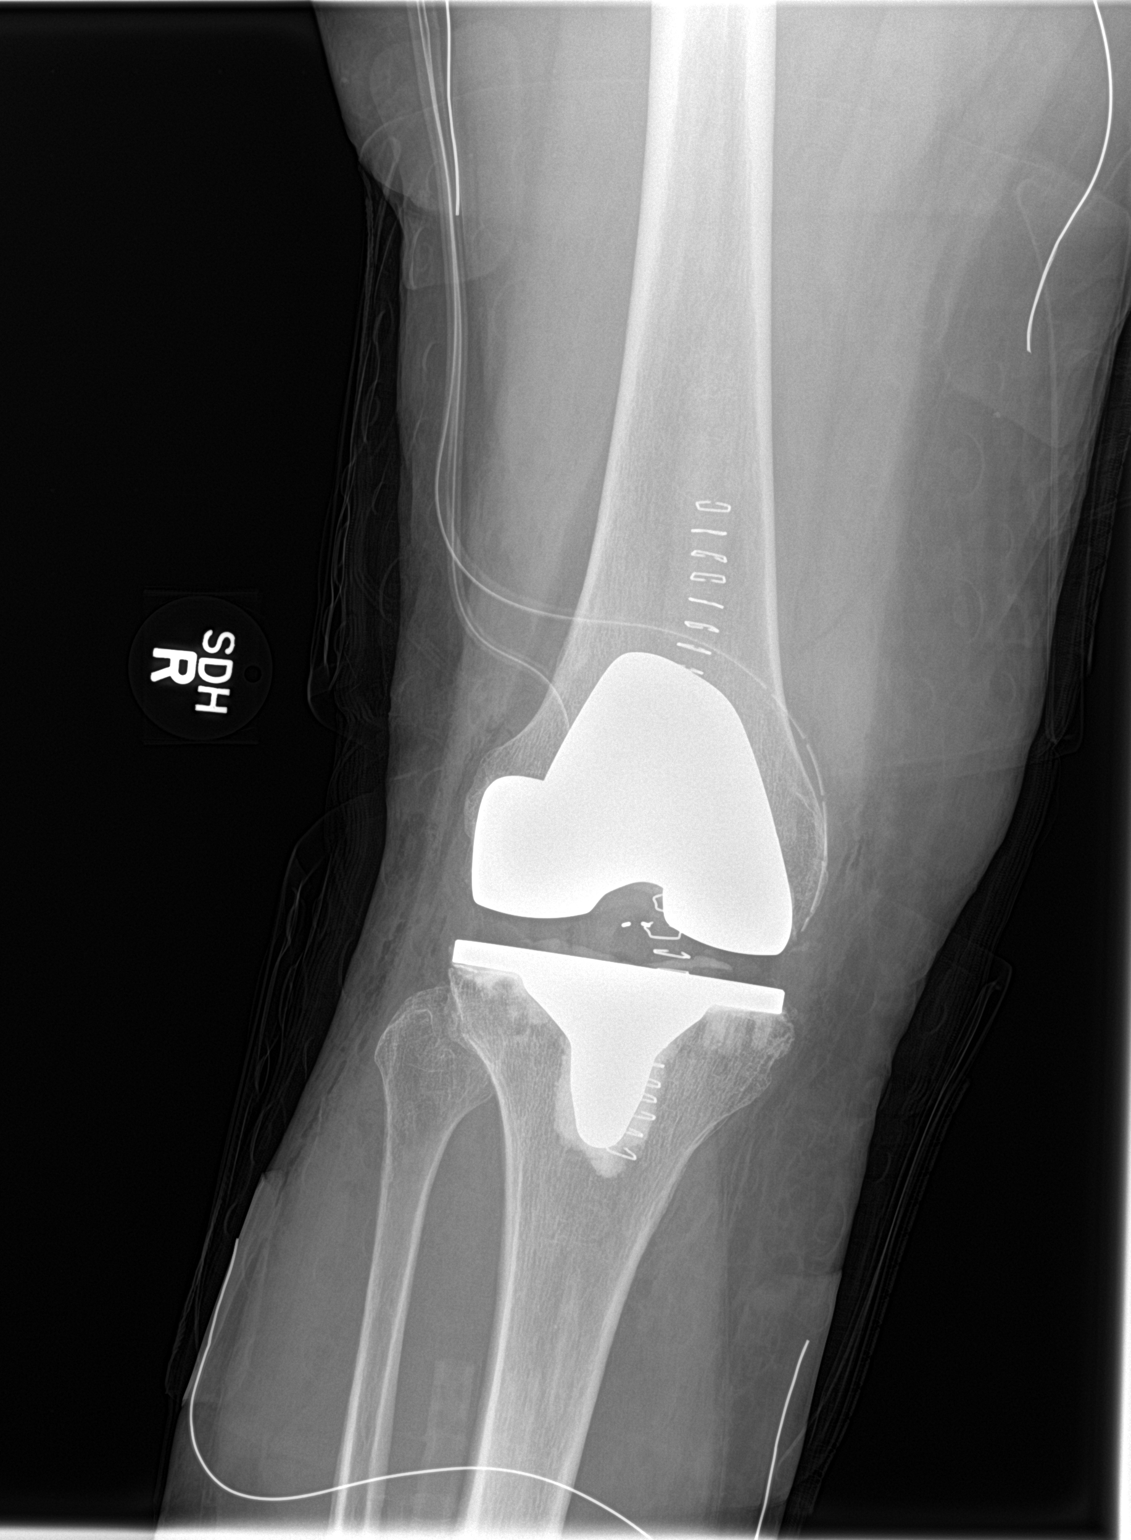

[knee lat]
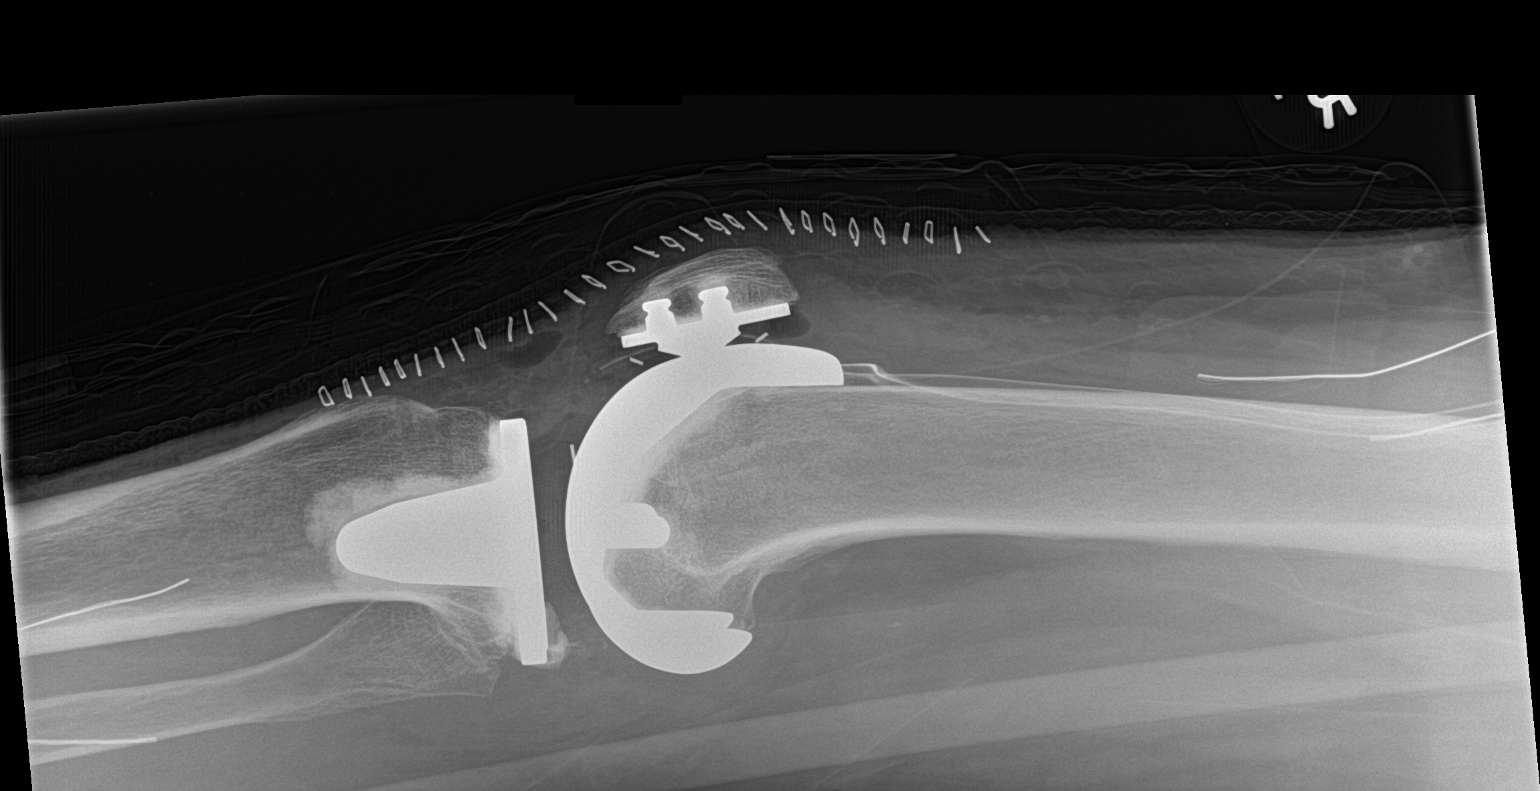

[2 of 2 positions shown; findings below may reference images not displayed]

FINDINGS: Changes of right knee replacement. Soft tissue drains in place. No
hardware or bony complicating feature.
IMPRESSION: Right knee replacement.  No visible complicating feature.

## 2018-11-07 ENCOUNTER — Telehealth: Payer: Self-pay | Admitting: Internal Medicine

## 2018-11-07 ENCOUNTER — Telehealth: Payer: Self-pay

## 2018-11-07 NOTE — Telephone Encounter (Signed)
Please see duplicate phone encounter dated at 11/07/18.

## 2018-11-07 NOTE — Telephone Encounter (Signed)
Spoke to patient, gave her Dr. Enid Derry response. Patient is aware. She will try again to contact [facility] to let them know. Nothing further needed at this time.

## 2018-11-07 NOTE — Telephone Encounter (Signed)
It would be unusual for asthma to get worse while on that much prednisone, so yes it could be anxiety. Lets just keep an eye on it to make sure it does not get worse.

## 2018-11-07 NOTE — Telephone Encounter (Signed)
Spoke to patient, she stated she just started prednisone "on a big dose" in preparation for some orthopedic surgery coming up. She states she is using her stiolto and ventolin, but still having some wheezing. She started prednisone taper April 10th with 60 mg x 4 days, 40 mg x 4 days, 30 mg x 4 days and 20 x 4 days. She is wondering if she is just getting anxious since she is also on some psychiatric medications.   I advised patient she should call [facility] and let them know the side effects she is having since they are the ones who put her on the prednisone in preparation for surgery. I will route to Dr. Juanell Fairly for further advise. Patient aware.

## 2018-12-01 ENCOUNTER — Other Ambulatory Visit: Payer: Self-pay | Admitting: Orthopaedic Surgery

## 2018-12-01 DIAGNOSIS — M542 Cervicalgia: Secondary | ICD-10-CM

## 2018-12-08 ENCOUNTER — Ambulatory Visit: Payer: Medicare HMO

## 2018-12-20 ENCOUNTER — Ambulatory Visit
Admission: RE | Admit: 2018-12-20 | Discharge: 2018-12-20 | Disposition: A | Discharge status: Home / Self Care | Payer: Medicare HMO | Source: Ambulatory Visit | Attending: Orthopaedic Surgery | Admitting: Orthopaedic Surgery

## 2018-12-20 ENCOUNTER — Other Ambulatory Visit: Payer: Self-pay

## 2018-12-20 DIAGNOSIS — M542 Cervicalgia: Secondary | ICD-10-CM | POA: Insufficient documentation

## 2019-01-11 ENCOUNTER — Other Ambulatory Visit: Payer: Self-pay | Admitting: Orthopedic Surgery

## 2019-01-11 DIAGNOSIS — Z96651 Presence of right artificial knee joint: Secondary | ICD-10-CM

## 2019-01-18 ENCOUNTER — Other Ambulatory Visit: Payer: Self-pay

## 2019-01-18 ENCOUNTER — Encounter
Admission: RE | Admit: 2019-01-18 | Discharge: 2019-01-18 | Disposition: A | Discharge status: Home / Self Care | Payer: Medicare HMO | Source: Ambulatory Visit | Attending: Orthopedic Surgery | Admitting: Orthopedic Surgery

## 2019-01-18 DIAGNOSIS — Z96651 Presence of right artificial knee joint: Secondary | ICD-10-CM | POA: Insufficient documentation

## 2019-01-18 MED ORDER — TECHNETIUM TC 99M MEDRONATE IV KIT
20.0000 | PACK | Freq: Once | INTRAVENOUS | Status: AC | PRN
  Administered 2019-01-18: 22.23 via INTRAVENOUS

## 2019-01-25 ENCOUNTER — Other Ambulatory Visit
Admission: RE | Admit: 2019-01-25 | Discharge: 2019-01-25 | Disposition: A | Discharge status: Home / Self Care | Payer: Medicare HMO | Source: Ambulatory Visit | Attending: Orthopedic Surgery | Admitting: Orthopedic Surgery

## 2019-01-25 DIAGNOSIS — M25561 Pain in right knee: Secondary | ICD-10-CM | POA: Insufficient documentation

## 2019-01-25 LAB — SYNOVIAL CELL COUNT + DIFF, W/ CRYSTALS
Crystals, Fluid: NONE SEEN
Eosinophils-Synovial: 0 %
Lymphocytes-Synovial Fld: 16 %
Monocyte-Macrophage-Synovial Fluid: 46 %
Neutrophil, Synovial: 38 %
Other Cells-SYN: 0
WBC, Synovial: 99 /mm3 (ref 0–200)

## 2019-01-28 LAB — BODY FLUID CULTURE: Culture: NO GROWTH

## 2019-04-04 ENCOUNTER — Ambulatory Visit (INDEPENDENT_AMBULATORY_CARE_PROVIDER_SITE_OTHER): Payer: Medicare HMO

## 2019-04-04 ENCOUNTER — Other Ambulatory Visit: Payer: Self-pay

## 2019-04-04 ENCOUNTER — Encounter: Payer: Self-pay | Admitting: Emergency Medicine

## 2019-04-04 ENCOUNTER — Ambulatory Visit
Admission: EM | Admit: 2019-04-04 | Discharge: 2019-04-04 | Disposition: A | Discharge status: Home / Self Care | Payer: Medicare HMO | Attending: Family Medicine | Admitting: Family Medicine

## 2019-04-04 DIAGNOSIS — S9031XA Contusion of right foot, initial encounter: Secondary | ICD-10-CM | POA: Diagnosis not present

## 2019-04-04 DIAGNOSIS — W228XXA Striking against or struck by other objects, initial encounter: Secondary | ICD-10-CM

## 2019-04-04 DIAGNOSIS — M79671 Pain in right foot: Secondary | ICD-10-CM

## 2019-04-04 NOTE — Discharge Instructions (Signed)
Ice. Use post op shoe.  Gradually increase activity as tolerated.  Follow up with your primary care physician this week as needed. Return to Urgent care for new or worsening concerns.

## 2019-04-04 NOTE — ED Provider Notes (Signed)
[facility] ____________________________________________  Time seen: Approximately 3:13 PM  I have reviewed the triage vital signs and the nursing notes.   HISTORY  Chief Complaint Foot Pain   HPI Kathryn Ramirez is a 68 y.o. female presenting for evaluation of right foot pain after injury that occurred 3 days ago.  Patient reports she accidentally hit her foot against a end of a chair causing pain.  Reports pain has continued prompting her to come in for evaluation.  States pain is mostly with direct palpation or walking.  Able to continue to weight-bear.  Denies alleviating factors.  Denies pain radiation, paresthesias or other right leg pain.  No recent cough, sickness.  Reports otherwise doing well.  Gale Journey, MD: PCP   Past Medical History:  Diagnosis Date  . Acid reflux disease   . Asthma   . Bipolar 1 disorder ([place])   . Diabetes mellitus without complication ([place])   . Hypertension   . Liver hemangioma   . Lung nodule   . Psoriasis   . Rheumatoid arthritis ([place])   . Tardive dyskinesia   . Wears dentures    full upper and lower    Patient Active Problem List   Diagnosis Date Noted  . Pain in limb 01/27/2018  . 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 Erroneous encounter - disregard 03/29/2017  . Heartburn   . Gastritis without bleeding   . Hx of colonic polyps   . Arthritis 09/02/2016  . Hep C w/o coma, chronic ([place]) 09/02/2016  . Hyperlipidemia, unspecified 09/02/2016  . Hypertension 09/02/2016  . Tardive dyskinesia 09/02/2016  . Urinary incontinence 09/02/2016  . History of hypothyroidism 08/19/2016  . Neck swelling 08/19/2016  . Acute asthma exacerbation 08/19/2016  . GERD (gastroesophageal reflux disease) 08/09/2016  . Dry mouth 06/01/2016  . Elevated antinuclear antibody (ANA) level 06/01/2016  . Generalized osteoarthritis of hand 06/01/2016  . Degenerative joint disease of right knee 06/01/2016  . Vocal cord polyp  05/17/2016  . Chest pain 04/06/2016  . 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 Sensory ataxia 03/25/2016  . Wrist pain, chronic, left 03/25/2016  . Impairment of balance 02/05/2016  . Acute right-sided low back pain without sciatica 02/05/2016  . Normal blood pressure 01/23/2016  . Pulmonary infiltrates   . Chronic cough   . Pulmonary scarring 01/06/2016  . OAB (overactive bladder) 12/18/2015  . Asthma 09/18/2015  . Multiple pulmonary nodules 09/15/2015  . Baker's cyst of knee, right 07/02/2015  . Bipolar disorder with moderate depression ([place]) 12/19/2014  . Chronic pain 12/19/2014  . Carpal tunnel syndrome of left wrist 10/01/2014  . Chronic RUQ pain 09/19/2014  . Esophageal dysphagia 09/19/2014  . Hemangioma of liver 09/19/2014  . Facet arthropathy, cervical 08/06/2014  . Osteoarthritis of spine with radiculopathy, cervical region 07/12/2014  . Cervical myelopathy with cervical radiculopathy 03/24/2014  . Hyponatremia 02/21/2014  . Neck pain 09/18/2013  . 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 CATARACT EXTRACTION W/ INTRAOCULAR LENS  IMPLANT, BILATERAL    . CERVICAL SPINE SURGERY  2017   [facility] [place]  . COLONOSCOPY WITH PROPOFOL N/A 09/14/2016   Procedure: COLONOSCOPY WITH PROPOFOL;  Surgeon: Lucilla Lame, MD;  Location: [facility] END[facility];  Service: Endoscopy;  Laterality: N/A;  . ESOPHAGOGASTRODUODENOSCOPY (EGD) WITH PROPOFOL N/A 09/14/2016   Procedure: ESOPHAGOGASTRODUODENOSCOPY (EGD) WITH PROPOFOL;  Surgeon: Lucilla Lame, MD;  Location: [facility] ENDOSCOPY;  Service: Endoscopy;  Laterality: N/A;  . FLEXIBLE BRONCHOSCOPY N/A 01/13/2016   Procedure: FLEXIBLE BRONCHOSCOPY;  Surgeon: Vilinda Boehringer, MD;  Location: [facility] ORS;  Service: Cardiopulmonary;  Laterality: N/A;  . NEUROPLASTY MAJOR NERVE    . POLYPECTOMY    . THROAT SURGERY    . TOTAL KNEE ARTHROPLASTY Right  06/15/2017   Procedure: TOTAL KNEE ARTHROPLASTY;  Surgeon: Earnestine Leys, MD;  Location: [facility] ORS;  Service: Orthopedics;  Laterality: Right;     No current facility-administered medications for this encounter.   Current Outpatient Medications:  .  albuterol (VENTOLIN HFA) 108 (90 Base) MCG/ACT inhaler, Inhale 2 puffs into the lungs every 4 (four) hours as needed for wheezing or shortness of breath., Disp: 3 Inhaler, Rfl: 1 .  alendronate (FOSAMAX) 70 MG tablet, Take 1 tablet (70 mg total) by mouth every 7 (seven) days. Take with a full glass of water on an empty stomach. (Patient taking differently: Take 70 mg by mouth every 7 (seven) days. Take with a full glass of water on an empty stomach. Either on Saturday or Sunday), Disp: 4 tablet, Rfl: 11 .  ARIPiprazole (ABILIFY) 10 MG tablet, Take 10 mg by mouth daily., Disp: , Rfl:  .  buPROPion (WELLBUTRIN XL) 150 MG 24 hr tablet, Take 150 mg by mouth daily., Disp: , Rfl:  .  calcipotriene (DOVONOX) 0.005 % ointment, Apply topically 2 (two) times daily. (Patient taking differently: Apply 1 application topically 2 (two) times daily as needed (psoriasis). ), Disp: 60 g, Rfl: 0 .  diazepam (VALIUM) 5 MG tablet, Take 2.5 mg by mouth 3 (three) times daily., Disp: , Rfl:  .  diclofenac sodium (VOLTAREN) 1 % GEL, Apply 2 g topically 2 (two) times daily as needed (pain)., Disp: , Rfl:  .  esomeprazole (NEXIUM) 40 MG capsule, TAKE 1 CAPSULE (40 MG TOTAL) DAILY BY MOUTH., Disp: 30 capsule, Rfl: 0 .  gabapentin (NEURONTIN) 300 MG capsule, Take 300 mg by mouth 2 (two) times daily., Disp: , Rfl:  .  oxybutynin (DITROPAN) 5 MG tablet, Take 5 mg by mouth 3 (three) times daily., Disp: , Rfl:  .  rosuvastatin (CRESTOR) 10 MG tablet, Take 1 tablet (10 mg total) by mouth at bedtime., Disp: 90 tablet, Rfl: 0 .  senna (SENOKOT) 8.6 MG TABS tablet, Take 1 tablet (8.6 mg total) by mouth 2 (two) times daily., Disp: 30 each, Rfl: 0 .  solifenacin (VESICARE) 10 MG tablet,  Take 1 tablet (10 mg total) by mouth daily., Disp: 90 tablet, Rfl: 1 .  Tiotropium Bromide-Olodaterol (STIOLTO RESPIMAT) 2.5-2.5 MCG/ACT AERS, Inhale 2 puffs into the lungs daily., Disp: 3 Inhaler, Rfl: 1 .  traZODone (DESYREL) 50 MG tablet, Take 50 mg 2 (two) times daily by mouth. , Disp: , Rfl:  .  metFORMIN (GLUCOPHAGE-XR) 500 MG 24 hr tablet, Take 1 tablet (500 mg total) by mouth daily with breakfast., Disp: 90 tablet, Rfl: 0 .  methocarbamol (ROBAXIN) 500 MG tablet, Take 500 mg by mouth 2 (two) times daily., Disp: , Rfl:   Allergies Codeine, Haldol [haloperidol], Haloperidol lactate, and Trileptal [oxcarbazepine]  Family History  Problem Relation Age of Onset  . Cancer Mother        Esophageal Ca  . Asthma Brother   . Breast cancer Neg Hx     Social History Social History   Tobacco Use  . Smoking status: Former Smoker    Types: E-cigarettes    Quit date: 09/19/2015    Years since quitting: 3.5  . Smokeless  tobacco: Never Used  Substance Use Topics  . Alcohol use: No    Alcohol/week: 0.0 standard drinks    Comment: 03/31/2012 sobierty   . Drug use: No    Review of Systems Constitutional: No fever ENT: No sore throat. Cardiovascular: Denies chest pain. Respiratory: Denies shortness of breath. Musculoskeletal: Positive right foot pain.  Skin: Negative for rash.  ____________________________________________   PHYSICAL EXAM:  VITAL SIGNS: ED Triage Vitals  Enc Vitals Group     BP 04/04/19 1459 (!) 150/85     Pulse Rate 04/04/19 1459 78     Resp 04/04/19 1459 18     Temp 04/04/19 1459 98 F (36.7 C)     Temp Source 04/04/19 1459 Oral     SpO2 04/04/19 1459 97 %     Weight 04/04/19 1455 179 lb (81.2 kg)     Height 04/04/19 1455 5\' 3"  (1.6 m)     Head Circumference --      Peak Flow --      Pain Score 04/04/19 1455 7     Pain Loc --      Pain Edu? --      Excl. in [place]? --     Constitutional: Alert and oriented. Well appearing and in no acute distress.  Eyes: Conjunctivae are normal. ENT      Head: Normocephalic and atraumatic. Cardiovascular: Normal rate, regular rhythm. Grossly normal heart sounds.  Good peripheral circulation. Respiratory: Normal respiratory effort without tachypnea nor retractions. Breath sounds are clear and equal bilaterally. No wheezes, rales, rhonchi. Musculoskeletal:Bilateral pedal pulses equal and easily palpated.Steady gait.  Except: Dorsal to lateral mid foot to distal fourth and fifth metatarsal mild to moderate tenderness to direct palpation with mild localized ecchymosis and edema, skin intact, normal to sensation and capillary refill, right foot otherwise nontender, pain with dorsiflexion plantarflexion.  Right lower extremity no edema and otherwise nontender. Neurologic:  Normal speech and language. Speech is normal.  Skin:  Skin is warm, dry Psychiatric: Mood and affect are normal. Speech and behavior are normal. Patient exhibits appropriate insight and judgment   ___________________________________________   LABS (all labs ordered are listed, but only abnormal results are displayed)  Labs Reviewed - No data to display ____________________________________________  RADIOLOGY  Dg Foot Complete Right  Result Date: 04/04/2019 CLINICAL DATA:  Injury, dorsal to lateral pain EXAM: RIGHT FOOT COMPLETE - 3+ VIEW COMPARISON:  None. FINDINGS: No fracture or dislocation of the right foot. Mild first metatarsophalangeal arthrosis. Joint spaces are otherwise well preserved. Small plantar calcaneal spur. Soft tissues are unremarkable. IMPRESSION: No fracture or dislocation of the right foot. Electronically Signed   By: Eddie Candle M.D.   On: 04/04/2019 15:30   ____________________________________________   PROCEDURES Procedures     INITIAL IMPRESSION / ASSESSMENT AND PLAN / ED COURSE  Pertinent labs & imaging results that were available during my care of the patient were reviewed by me and considered in my  medical decision making (see chart for details).  Well-appearing patient.  No acute distress.  Right foot injury.  Right foot x-ray as above, negative.  Contusion injury.  Postop shoe for support.  Ice and supportive care.  Discussed follow up with Primary care physician this week as needed. Discussed follow up and return parameters including no resolution or any worsening concerns. Patient verbalized understanding and agreed to plan.   ____________________________________________   FINAL CLINICAL IMPRESSION(S) / ED DIAGNOSES  Final diagnoses:  Contusion of right foot, initial encounter  ED Discharge Orders    None       Note: This dictation was prepared with Dragon dictation along with smaller phrase technology. Any transcriptional errors that result from this process are unintentional.         Marylene Land, NP 04/04/19 1541

## 2019-04-04 NOTE — ED Triage Notes (Signed)
Patient c/o kicking her chair 3 days ago with her right foot. She is c/o pain on the top of her right foot.

## 2019-04-16 ENCOUNTER — Ambulatory Visit: Payer: Medicare HMO | Admitting: Internal Medicine

## 2019-04-19 ENCOUNTER — Encounter: Payer: Self-pay | Admitting: Pulmonary Disease

## 2019-04-19 ENCOUNTER — Other Ambulatory Visit: Payer: Self-pay

## 2019-04-19 ENCOUNTER — Ambulatory Visit (INDEPENDENT_AMBULATORY_CARE_PROVIDER_SITE_OTHER): Payer: Medicare HMO | Admitting: Pulmonary Disease

## 2019-04-19 VITALS — BP 124/84 | HR 71 | Temp 97.9°F | Ht 63.0 in | Wt 187.0 lb

## 2019-04-19 DIAGNOSIS — R05 Cough: Secondary | ICD-10-CM | POA: Diagnosis not present

## 2019-04-19 DIAGNOSIS — K219 Gastro-esophageal reflux disease without esophagitis: Secondary | ICD-10-CM | POA: Diagnosis not present

## 2019-04-19 DIAGNOSIS — J4530 Mild persistent asthma, uncomplicated: Secondary | ICD-10-CM

## 2019-04-19 DIAGNOSIS — J383 Other diseases of vocal cords: Secondary | ICD-10-CM | POA: Diagnosis not present

## 2019-04-19 DIAGNOSIS — R053 Chronic cough: Secondary | ICD-10-CM

## 2019-04-19 MED ORDER — ARNUITY ELLIPTA 100 MCG/ACT IN AEPB: 1.0000 | INHALATION_SPRAY | Freq: Every day | RESPIRATORY_TRACT | 1 refills | Status: DC

## 2019-04-19 NOTE — Patient Instructions (Signed)
1.  Use Stiolto daily the dose is 2 puffs once daily.  2.  We have started you on Arnuity Ellipta 100, 1 puff daily this should be used after Stiolto, make sure you rinse your mouth well after use.  3.  Use Zyrtec over-the-counter 1 tablet at bedtime  4.  We will see you back in follow-up in 4 to 6 weeks time.  5.  We will schedule you for breathing test.

## 2019-05-17 ENCOUNTER — Ambulatory Visit (INDEPENDENT_AMBULATORY_CARE_PROVIDER_SITE_OTHER): Payer: Medicare HMO | Admitting: Pulmonary Disease

## 2019-05-17 ENCOUNTER — Other Ambulatory Visit: Payer: Self-pay

## 2019-05-17 ENCOUNTER — Encounter: Payer: Self-pay | Admitting: Pulmonary Disease

## 2019-05-17 VITALS — BP 128/72 | HR 69 | Temp 97.6°F | Ht 63.0 in | Wt 187.0 lb

## 2019-05-17 DIAGNOSIS — K219 Gastro-esophageal reflux disease without esophagitis: Secondary | ICD-10-CM | POA: Diagnosis not present

## 2019-05-17 DIAGNOSIS — J383 Other diseases of vocal cords: Secondary | ICD-10-CM

## 2019-05-17 DIAGNOSIS — J4530 Mild persistent asthma, uncomplicated: Secondary | ICD-10-CM | POA: Diagnosis not present

## 2019-05-17 DIAGNOSIS — R053 Chronic cough: Secondary | ICD-10-CM

## 2019-05-17 DIAGNOSIS — R05 Cough: Secondary | ICD-10-CM

## 2019-05-17 MED ORDER — PANTOPRAZOLE SODIUM 40 MG PO TBEC: 40.0000 mg | DELAYED_RELEASE_TABLET | Freq: Every evening | ORAL | 3 refills | Status: DC

## 2019-05-17 NOTE — Progress Notes (Signed)
Subjective:    Patient ID: Kathryn Ramirez, female    DOB: 05-22-1951, 68 y.o.   MRN: [id number]  HPI Patient is a 68 year old former smoker (1 PPD, quit 2017) who followed here previously with Dr. Ashby Dawes.  She has a history of asthma.  She had bronchoscopy in 2017 for atelectasis of the right middle lobe results that were benign at that time, no infectious process.  Bronchoscopy was also done because of chronic cough.  He had pulmonary function testing performed on May 2017 that showed no significant obstruction but some mild air trapping, airways resistance was noted to be moderately high.  Diffusion capacity was normal.  She states that she has had to increase her use of Ventolin to 4 times a day.  This is because of cough.  She has a somewhat challenging historian.  She uses Nexium over-the-counter for gastroesophageal reflux issues.  Cough is usually worse in the evenings and after the evening meal.  This is highly suspicious for reflux.  He states however she feels the Nexium is controlling the symptoms.  She uses Stiolto with some degree of control.  She is not on inhaled corticosteroid due to issues of chronic hoarseness and prior vocal cord nodules.  Note is made however that even without inhaled corticosteroid she still has the issues with hoarseness.  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  Note is made that she is also on Fosamax.  Review of Systems  Constitutional: Negative.   HENT: Negative.   Eyes: Negative.   Respiratory: Positive for cough.   Cardiovascular: Negative.   Gastrointestinal: Negative.   Endocrine: Negative.   Allergic/Immunologic: Positive for environmental allergies.  Neurological: Negative.   Psychiatric/Behavioral: The patient is nervous/anxious.        Bipolar disorder  All other systems reviewed and are negative.       Objective:   Physical Exam Constitutional:      Appearance: Normal appearance. She is obese. She is not ill-appearing.  HENT:     Head: Normocephalic and atraumatic.     Right Ear: External ear normal.     Left Ear: External ear normal.     Nose:     Comments: Nose/mouth/throat not examined due to masking requirements for COVID 19. Eyes:     General: No scleral icterus.    Conjunctiva/sclera: Conjunctivae normal.     Pupils: Pupils are equal, round, and reactive to light.  Neck:     Musculoskeletal: Neck supple. No muscular tenderness.     Vascular: No JVD.     Trachea: Trachea and phonation normal.  Cardiovascular:     Rate and Rhythm: Normal rate and regular rhythm.     Pulses: Normal pulses.     Heart sounds: Normal heart sounds.  Pulmonary:     Effort: Pulmonary effort is normal.     Breath sounds: Normal breath sounds.  Abdominal:     General: Abdomen is protuberant. There is no distension.  Musculoskeletal: Normal range of motion.     Right lower leg: No edema.     Left lower leg: No edema.  Lymphadenopathy:     Cervical: No cervical adenopathy.  Skin:    General: Skin is warm and dry.  Neurological:     General: No focal deficit present.     Mental Status: She is alert and oriented to person, place, and  time.  Psychiatric:        Mood and Affect: Affect is blunt. Affect is not flat.        Behavior: Behavior is cooperative.       Assessment & Plan:   1.  Mild persistent asthma: Continue Stiolto however she will need inhaled corticosteroid.  Will give her a trial of Arnuity Ellipta 100 mcg, 1 puff daily.  She was taught how to use the inhaler.  She also was taught about careful rinsing afterwards.  She is to notify us if this causes her more hoarseness.  We will order pulmonary function testing to reassess her lung function.  Follow-up will be in 4 to 6 weeks time she is to contact us prior to that time should any new difficulties arise.  2.  Chronic cough: This  is highly suggestive of gastroesophageal reflux with LPR component.  For now she will continue Nexium.  She was taught proper antireflux measures.  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  3.  Gastroesophageal reflux: Continue Nexium for now.  May need to switch to a different PPI if she persist with the symptoms.  4.  Vocal cord dysfunction: The patient has had issues after polypectomy on her right vocal cord.  She may have partial paresis of this vocal cord which in turn will likely cause issues with chronic silent aspiration and will unfortunately have an impact on her control of her cough.  This issue adds complexity to her management.   This chart was dictated using voice recognition software/Dragon.  Despite best efforts to proofread, errors can occur which can change the meaning.  Any change was purely unintentional.

## 2019-05-17 NOTE — Patient Instructions (Signed)
1.  Continue Stiolto and Arnuity.  Rinse mouth well after Arnuity.  You can use a little baking soda in the rinsing water.  2.  Discontinue omeprazole, we have switch you to pantoprazole (Protonix) 40 mg before the evening meal.  3.  We will see you in follow-up after your breathing test.  Call sooner should any new difficulties arise.

## 2019-05-17 NOTE — Progress Notes (Signed)
Subjective:    Patient ID: Kathryn Ramirez, female    DOB: 1951/07/21, 68 y.o.   MRN: [id number]  HPI 68 year old former smoker (quit 2017) followed here previously by Dr. Ashby Dawes.  She has mild persistent asthma and chronic cough.  I first evaluated her on 24 September please see that note for details.  Patient also has issues with chronic hoarseness after vocal cord polypectomy at the [facility] in 2013.  She has had chronic scarring of the vocal cord and suspect also partial paresis judging by her hoarseness.  The patient has been on Stiolto and we added Arnuity Ellipta as she was not on inhaled corticosteroid.  He is tolerating these 2 medications well.  She still thinks that occasionally she needs albuterol and I told her that this is totally reasonable he does note that previously she was using it up to 4 times a day (albuterol) but now she uses it rarely.  She does have issues with the cough being worse in the evenings particularly after meal.  She has been on Nexium 20 mg twice daily (OTC), she also notices chest discomfort after her evening meal.  She has not had any fever, chills or sweats.  No orthopnea or paroxysmal nocturnal dyspnea no lower extremity edema.  She voices no other complaint.  She will need neck surgery that will be performed at [facility] possibly in January.  She has PFTs pending that were ordered during her last visit.  Since her last visit overall she feels her cough may be a little better.   Review of Systems  Constitutional: Negative.   HENT: Negative.   Eyes: Negative.   Respiratory: Positive for cough.   Cardiovascular: Negative.   Gastrointestinal: Negative.   Endocrine: Negative.   Allergic/Immunologic: Positive for environmental allergies.  Neurological: Negative.   Psychiatric/Behavioral: The patient is nervous/anxious.        Bipolar disorder  All other systems reviewed and are negative.    Objective:   Physical Exam Constitutional:    Appearance: Normal appearance. She is obese. She is not ill-appearing.  HENT:     Head: Normocephalic and atraumatic.  She is hoarse, hollow quality to the voice.    Right Ear: External ear normal.     Left Ear: External ear normal.     Nose:     Comments: Nose/mouth/throat not examined due to masking requirements for COVID 19. Eyes:     General: No scleral icterus.    Conjunctiva/sclera: Conjunctivae normal.     Pupils: Pupils are equal, round, and reactive to light.  Neck:     Musculoskeletal: Neck supple. No muscular tenderness.     Vascular: No JVD.     Trachea: Trachea and phonation normal.  Cardiovascular:     Rate and Rhythm: Normal rate and regular rhythm.     Pulses: Normal pulses.     Heart sounds: Normal heart sounds.  Pulmonary:     Effort: Pulmonary effort is normal.     Breath sounds: Normal breath sounds.  Abdominal:     General: Abdomen is protuberant. There is no distension.  Musculoskeletal: Normal range of motion.     Right lower leg: No edema.     Left lower leg: No edema.  Lymphadenopathy:     Cervical: No cervical adenopathy.  Skin:    General: Skin is warm and dry.  Neurological:     General: No focal deficit present.     Mental Status: She is alert and  oriented to person, place, and time.  Psychiatric:        Mood and Affect: Affect is blunt. Affect is not flat.        Behavior: Behavior is cooperative.       Assessment & Plan:  1.  Mild persistent asthma: Continue Stiolto and Arnuity as they are.    We reiterated proper rinsing after Arnuity.  Also advised that she may use a little baking soda in the water to rinse her mouth with.    Pulmonary function testing is pending.  Follow-up will be in 3 months time she is to contact us prior to that time should any new difficulties arise.  2.  Chronic cough: This is highly suggestive of gastroesophageal reflux with LPR component.  Switched Nexium to Protonix 40 mg before the evening meal.  This symptom is  likely  aggravated by her chronic unilateral vocal cord scarring/paresis after vocal cord polypectomy.  3.  Gastroesophageal reflux:  As above switch to Protonix.   4.  Vocal cord dysfunction: The patient has had issues after polypectomy on her right vocal cord.  She may have partial paresis of this vocal cord which in turn will likely cause issues with chronic silent aspiration and will unfortunately have an impact on her control of her cough.  This issue adds complexity to her management.  She may need referral to the voice disorders center at [facility].  This chart was dictated using voice recognition software/Dragon.  Despite best efforts to proofread, errors can occur which can change the meaning.  Any change was purely unintentional.   This chart was dictated using voice recognition software/Dragon.  Despite best efforts to proofread, errors can occur which can change the meaning.  Any change was purely unintentional.

## 2019-07-23 ENCOUNTER — Other Ambulatory Visit: Payer: Self-pay | Admitting: Internal Medicine

## 2019-08-06 ENCOUNTER — Ambulatory Visit: Payer: Medicare HMO | Admitting: Pulmonary Disease

## 2019-08-06 ENCOUNTER — Other Ambulatory Visit: Payer: Medicare HMO

## 2019-08-06 ENCOUNTER — Other Ambulatory Visit: Payer: Self-pay

## 2019-08-06 ENCOUNTER — Encounter: Payer: Self-pay | Admitting: Pulmonary Disease

## 2019-08-06 VITALS — BP 124/78 | HR 84 | Temp 97.7°F | Ht 63.0 in | Wt 195.0 lb

## 2019-08-06 DIAGNOSIS — R05 Cough: Secondary | ICD-10-CM

## 2019-08-06 DIAGNOSIS — R053 Chronic cough: Secondary | ICD-10-CM

## 2019-08-06 NOTE — Patient Instructions (Addendum)
1.  Drink plenty of fluids  2.  Warm herbal teas or ginger tea may help soothe the throat and your cough.  3.  You may take Mucinex DM (regular strength) 1 to 2 tablets twice a day as needed for cough.  We have provided you with some samples.  4.  We will see you back in 6 to 8 weeks time call sooner should any new difficulties arise.

## 2019-08-06 NOTE — Progress Notes (Signed)
 Assessment & Plan:  There are no diagnoses linked to this encounter.  Patient Instructions  1.  Drink plenty of fluids  2.  Warm herbal teas or ginger tea may help soothe the throat and your cough.  3.  You may take Mucinex DM (regular strength) 1 to 2 tablets twice a day as needed for cough.  We have provided you with some samples.  4.  We will see you back in 6 to 8 weeks time call sooner should any new difficulties arise.  Please note: late entry documentation due to logistical difficulties during COVID-19 pandemic. This note is filed for information purposes only, and is not intended to be used for billing, nor does it represent the full scope/nature of the visit in question. Please see any associated scanned media linked to date of encounter for additional pertinent information.  Subjective:    HPI: Kathryn Ramirez is a 69 y.o. female presenting to the pulmonology clinic on 08/06/2019 with report of: Follow-up (ED visit 07/21/2019 dx with bronchitis- given prednsione and doxy. breathing improved with treatment. c/o sob with exertion and non prod cough. )     Outpatient Encounter Medications as of 08/06/2019  Medication Sig Note   buPROPion  (WELLBUTRIN  XL) 150 MG 24 hr tablet Take 150 mg by mouth daily.    oxybutynin  (DITROPAN ) 5 MG tablet Take 5 mg by mouth every morning.     [DISCONTINUED] albuterol  (VENTOLIN  HFA) 108 (90 Base) MCG/ACT inhaler INHALE 2 PUFFS EVERY 4 HOURS AS NEEDED FOR WHEEZING OR SHORTNESS OF BREATH    [DISCONTINUED] alendronate  (FOSAMAX ) 70 MG tablet Take 1 tablet (70 mg total) by mouth every 7 (seven) days. Take with a full glass of water on an empty stomach. (Patient taking differently: Take 70 mg by mouth every 7 (seven) days. Take with a full glass of water on an empty stomach. Either on Saturday or Sunday) 02/18/2020: Sundays    [DISCONTINUED] ARIPiprazole  (ABILIFY ) 5 MG tablet Take 10 mg by mouth daily.    [DISCONTINUED] calcipotriene  (DOVONOX) 0.005  % ointment Apply topically 2 (two) times daily. (Patient taking differently: Apply 1 application topically 2 (two) times daily as needed (psoriasis). )    [DISCONTINUED] diazepam  (VALIUM ) 2 MG tablet Take 1 mg by mouth at bedtime.     [DISCONTINUED] diclofenac sodium (VOLTAREN) 1 % GEL Apply 2 g topically 2 (two) times daily as needed (pain).    [DISCONTINUED] Fluticasone  Furoate (ARNUITY ELLIPTA ) 100 MCG/ACT AEPB Inhale 1 puff into the lungs daily.    [DISCONTINUED] gabapentin  (NEURONTIN ) 600 MG tablet Take 300 mg by mouth 3 (three) times daily.  (Patient not taking: Reported on 08/11/2020)    [DISCONTINUED] hydrOXYzine (ATARAX/VISTARIL) 25 MG tablet Take by mouth.    [DISCONTINUED] methocarbamol  (ROBAXIN ) 500 MG tablet Take 500 mg by mouth 2 (two) times daily.    [DISCONTINUED] pantoprazole  (PROTONIX ) 40 MG tablet Take 1 tablet (40 mg total) by mouth every evening.    [DISCONTINUED] rosuvastatin  (CRESTOR ) 10 MG tablet Take 1 tablet (10 mg total) by mouth at bedtime.    [DISCONTINUED] senna (SENOKOT) 8.6 MG TABS tablet Take 1 tablet (8.6 mg total) by mouth 2 (two) times daily. (Patient taking differently: Take 1 tablet by mouth daily as needed for mild constipation. )    [DISCONTINUED] solifenacin  (VESICARE ) 10 MG tablet Take 1 tablet (10 mg total) by mouth daily.    [DISCONTINUED] Tiotropium Bromide -Olodaterol (STIOLTO RESPIMAT ) 2.5-2.5 MCG/ACT AERS Inhale 2 puffs into the lungs daily.    [  DISCONTINUED] traZODone  (DESYREL ) 150 MG tablet Take 200 mg by mouth at bedtime.  (Patient not taking: Reported on 08/11/2020)    [DISCONTINUED] metFORMIN  (GLUCOPHAGE -XR) 500 MG 24 hr tablet Take 1 tablet (500 mg total) by mouth daily with breakfast.    No facility-administered encounter medications on file as of 08/06/2019.      Objective:   Vitals:   08/06/19 1601  BP: 124/78  Pulse: 84  Temp: 97.7 F (36.5 C)  Height: 5' 3 (1.6 m)  Weight: 195 lb (88.5 kg)  SpO2: 97%  TempSrc: Temporal  BMI  (Calculated): 34.55     Physical exam documentation is limited by delayed entry of information.

## 2019-08-07 ENCOUNTER — Ambulatory Visit: Payer: Medicare HMO

## 2019-08-15 ENCOUNTER — Other Ambulatory Visit: Payer: Self-pay | Admitting: Internal Medicine

## 2019-08-28 ENCOUNTER — Other Ambulatory Visit: Payer: Self-pay | Admitting: Pulmonary Disease

## 2019-09-04 NOTE — Telephone Encounter (Signed)
Rx for Ventolin has been sent to preferred pharmacy.  Pt is aware and voiced her understanding. Nothing further is needed.

## 2019-09-04 NOTE — Telephone Encounter (Signed)
Pt calling stating that [place] has not been able to reach Dr. Patsey Berthold regarding getting her albuterol refilled and she is almost out of the medication. Pt can be reached at [phone].

## 2019-09-21 ENCOUNTER — Ambulatory Visit: Payer: Medicare HMO | Admitting: Pulmonary Disease

## 2019-09-21 ENCOUNTER — Other Ambulatory Visit: Payer: Self-pay

## 2019-09-21 ENCOUNTER — Encounter: Payer: Self-pay | Admitting: Pulmonary Disease

## 2019-09-21 VITALS — BP 100/68 | HR 77 | Temp 97.7°F | Ht 64.0 in | Wt 194.6 lb

## 2019-09-21 DIAGNOSIS — J449 Chronic obstructive pulmonary disease, unspecified: Secondary | ICD-10-CM

## 2019-09-21 NOTE — Patient Instructions (Signed)
We will enroll you in the Lung Cancer Screening program   Continue Stiolto 2 inhalations daily   Continue albuterol as needed   We will see you in 4  months time call sooner should any new difficulties arise.

## 2019-09-21 NOTE — Progress Notes (Signed)
 Assessment & Plan:  1. Asthma-COPD overlap syndrome (HCC) (Primary)   Patient Instructions  We will enroll you in the Lung Cancer Screening program   Continue Stiolto 2 inhalations daily   Continue albuterol  as needed   We will see you in 4  months time call sooner should any new difficulties arise.  Please note: late entry documentation due to logistical difficulties during COVID-19 pandemic. This note is filed for information purposes only, and is not intended to be used for billing, nor does it represent the full scope/nature of the visit in question. Please see any associated scanned media linked to date of encounter for additional pertinent information.  Subjective:    HPI: Kathryn Ramirez is a 69 y.o. female presenting to the pulmonology clinic on 09/21/2019 with report of: Follow-up (Pt states she thinks she has been doing ok. SOB on exertion. Occ cough with white sputum. Pt denies any wheezing, fever, chills, or sweats.)     Outpatient Encounter Medications as of 09/21/2019  Medication Sig Note   buPROPion  (WELLBUTRIN  XL) 150 MG 24 hr tablet Take 150 mg by mouth daily.    oxybutynin  (DITROPAN ) 5 MG tablet Take 5 mg by mouth every morning.     [DISCONTINUED] albuterol  (VENTOLIN  HFA) 108 (90 Base) MCG/ACT inhaler INHALE 2 PUFFS EVERY 4 HOURS AS NEEDED FOR WHEEZING FOR SHORTNESS OF BREATH    [DISCONTINUED] alendronate  (FOSAMAX ) 70 MG tablet Take 1 tablet (70 mg total) by mouth every 7 (seven) days. Take with a full glass of water on an empty stomach. (Patient taking differently: Take 70 mg by mouth every 7 (seven) days. Take with a full glass of water on an empty stomach. Either on Saturday or Sunday) 02/18/2020: Sundays    [DISCONTINUED] ARIPiprazole  (ABILIFY ) 5 MG tablet Take 10 mg by mouth daily.    [DISCONTINUED] calcipotriene  (DOVONOX) 0.005 % ointment Apply topically 2 (two) times daily. (Patient taking differently: Apply 1 application topically 2 (two) times daily  as needed (psoriasis). )    [DISCONTINUED] diazepam  (VALIUM ) 2 MG tablet Take 1 mg by mouth at bedtime.     [DISCONTINUED] diclofenac sodium (VOLTAREN) 1 % GEL Apply 2 g topically 2 (two) times daily as needed (pain).    [DISCONTINUED] gabapentin  (NEURONTIN ) 600 MG tablet Take 300 mg by mouth 3 (three) times daily.  (Patient not taking: Reported on 08/11/2020)    [DISCONTINUED] hydrOXYzine (ATARAX/VISTARIL) 25 MG tablet Take by mouth.    [DISCONTINUED] pantoprazole  (PROTONIX ) 40 MG tablet Take 1 tablet (40 mg total) by mouth every evening.    [DISCONTINUED] rosuvastatin  (CRESTOR ) 10 MG tablet Take 1 tablet (10 mg total) by mouth at bedtime.    [DISCONTINUED] senna (SENOKOT) 8.6 MG TABS tablet Take 1 tablet (8.6 mg total) by mouth 2 (two) times daily. (Patient taking differently: Take 1 tablet by mouth daily as needed for mild constipation. )    [DISCONTINUED] solifenacin  (VESICARE ) 10 MG tablet Take 1 tablet (10 mg total) by mouth daily.    [DISCONTINUED] STIOLTO RESPIMAT  2.5-2.5 MCG/ACT AERS INHALE 2 PUFFS INTO LUNGS EVERY DAY (Patient taking differently: Inhale 2 puffs into the lungs daily. )    [DISCONTINUED] traZODone  (DESYREL ) 150 MG tablet Take 200 mg by mouth at bedtime.  (Patient not taking: Reported on 08/11/2020)    SYNJARDY  XR 25-1000 MG TB24 Take 1 tablet by mouth daily.     [DISCONTINUED] metFORMIN  (GLUCOPHAGE -XR) 500 MG 24 hr tablet Take 1 tablet (500 mg total) by mouth daily with breakfast.  No facility-administered encounter medications on file as of 09/21/2019.      Objective:   Vitals:   09/21/19 1124  BP: 100/68  Pulse: 77  Temp: 97.7 F (36.5 C)  Height: 5' 4 (1.626 m)  Weight: 194 lb 9.6 oz (88.3 kg)  SpO2: 97% Comment: on ra  TempSrc: Temporal  BMI (Calculated): 33.39     Physical exam documentation is limited by delayed entry of information.

## 2019-09-22 ENCOUNTER — Telehealth: Payer: Self-pay | Admitting: *Deleted

## 2019-09-22 DIAGNOSIS — Z87891 Personal history of nicotine dependence: Secondary | ICD-10-CM

## 2019-09-22 NOTE — Telephone Encounter (Signed)
Received referral for initial lung cancer screening scan. Contacted patient and obtained smoking history,(former, quit 09/19/15, 30 pack year) as well as answering questions related to screening process. Patient denies signs of lung cancer such as weight loss or hemoptysis. Patient denies comorbidity that would prevent curative treatment if lung cancer were found. Patient is scheduled for shared decision making visit on 09/27/19 and Ct 09/28/19 at 11am.

## 2019-09-22 NOTE — Telephone Encounter (Signed)
Patient reports Is getting 2nd covid vaccine on 10/01/19. Will reschedule lung screening scan after that.

## 2019-09-27 ENCOUNTER — Ambulatory Visit: Payer: Medicare HMO | Admitting: Oncology

## 2019-10-31 ENCOUNTER — Other Ambulatory Visit: Payer: Self-pay

## 2019-10-31 ENCOUNTER — Inpatient Hospital Stay: Payer: Medicare HMO | Attending: Oncology | Admitting: Oncology

## 2019-10-31 ENCOUNTER — Ambulatory Visit
Admission: RE | Admit: 2019-10-31 | Discharge: 2019-10-31 | Disposition: A | Discharge status: Home / Self Care | Payer: Medicare HMO | Source: Ambulatory Visit | Attending: Oncology | Admitting: Oncology

## 2019-10-31 DIAGNOSIS — Z87891 Personal history of nicotine dependence: Secondary | ICD-10-CM

## 2019-10-31 NOTE — Progress Notes (Signed)
Virtual Visit via Video Note  I connected with Kathryn Ramirez on 10/31/19 at 10:45 AM EDT by a video enabled telemedicine application and verified that I am speaking with the correct person using two identifiers.  Location: Patient: [facility] Provider: Office   I discussed the limitations of evaluation and management by telemedicine and the availability of in person appointments. The patient expressed understanding and agreed to proceed.  I discussed the assessment and treatment plan with the patient. The patient was provided an opportunity to ask questions and all were answered. The patient agreed with the plan and demonstrated an understanding of the instructions.   The patient was advised to call back or seek an in-person evaluation if the symptoms worsen or if the condition fails to improve as anticipated.   In accordance with CMS guidelines, patient has met eligibility criteria including age, absence of signs or symptoms of lung cancer.  Social History   Tobacco Use  . Smoking status: Former Smoker    Packs/day: 1.00    Years: 30.00    Pack years: 30.00    Types: E-cigarettes    Quit date: 09/19/2015    Years since quitting: 4.1  . Smokeless tobacco: Never Used  Substance Use Topics  . Alcohol use: No    Alcohol/week: 0.0 standard drinks    Comment: 03/31/2012 sobierty   . Drug use: No      A shared decision-making session was conducted prior to the performance of CT scan. This includes one or more decision aids, includes benefits and harms of screening, follow-up diagnostic testing, over-diagnosis, false positive rate, and total radiation exposure.   Counseling on the importance of adherence to annual lung cancer LDCT screening, impact of co-morbidities, and ability or willingness to undergo diagnosis and treatment is imperative for compliance of the program.   Counseling on the importance of continued smoking cessation for former smokers; the importance of smoking cessation for  current smokers, and information about tobacco cessation interventions have been given to patient including [place] and [employer] quit [place] programs.   Written order for lung cancer screening with LDCT has been given to the patient and any and all questions have been answered to the best of my abilities.    Yearly follow up will be coordinated by Burgess Estelle, Thoracic Navigator.  I provided 15 minutes of face-to-face video visit time during this encounter, and > 50% was spent counseling as documented under my assessment & plan.   Jacquelin Hawking, NP

## 2019-11-01 ENCOUNTER — Telehealth: Payer: Self-pay | Admitting: *Deleted

## 2019-11-01 NOTE — Telephone Encounter (Signed)
Noted, thank you

## 2019-11-01 NOTE — Telephone Encounter (Signed)
Notified patient of LDCT lung cancer screening program results with recommendation for 12 month follow up imaging. Also notified of incidental findings noted below and is encouraged to discuss further with PCP who will receive a copy of this note and/or the CT report. Patient verbalizes understanding.   IMPRESSION: 1. Lung-RADS 1, negative. Continue annual screening with low-dose chest CT without contrast in 12 months. 2. Cholelithiasis. 3. Aortic atherosclerosis (ICD10-I70.0). Coronary artery calcification. 4.  Emphysema (ICD10-J43.9).

## 2019-11-15 ENCOUNTER — Other Ambulatory Visit: Payer: Self-pay | Admitting: Pulmonary Disease

## 2019-11-16 ENCOUNTER — Telehealth: Payer: Self-pay | Admitting: Pulmonary Disease

## 2019-11-16 NOTE — Telephone Encounter (Signed)
I do not see where [place] office called pt- pt is a BT pt, enrolled in lung screening program through [facility].  Pt does have a televisit on 4/28 with Beth to review CT chest, but it looks like the most recent CT chest was for lung screening program?  lmtcb for pt.

## 2019-11-21 ENCOUNTER — Other Ambulatory Visit: Payer: Self-pay

## 2019-11-21 ENCOUNTER — Ambulatory Visit (INDEPENDENT_AMBULATORY_CARE_PROVIDER_SITE_OTHER): Payer: Medicare HMO | Admitting: Primary Care

## 2019-11-21 DIAGNOSIS — Z712 Person consulting for explanation of examination or test findings: Secondary | ICD-10-CM | POA: Diagnosis not present

## 2019-11-21 DIAGNOSIS — Z87891 Personal history of nicotine dependence: Secondary | ICD-10-CM

## 2019-11-21 DIAGNOSIS — J439 Emphysema, unspecified: Secondary | ICD-10-CM | POA: Diagnosis not present

## 2019-11-21 NOTE — Progress Notes (Signed)
Virtual Visit via Telephone Note  I connected with Kathryn Ramirez on 11/21/19 at  9:30 AM EDT by telephone and verified that I am speaking with the correct person using two identifiers.  Location: Patient: Home Provider: Home   I discussed the limitations, risks, security and privacy concerns of performing an evaluation and management service by telephone and the availability of in person appointments. I also discussed with the patient that there may be a patient responsible charge related to this service. The patient expressed understanding and agreed to proceed.   History of Present Illness: 69 year old female, former smoker quit in 2017 (30 pack year hx). Patient of Dr. Patsey Berthold. PMH asthma/COPD, asthma, GERD, vocal cord polyp, hypertension,bipolar depression . Maintained on Stiolto respimat.   11/21/2019 Patient contacted today for televisit to go over CT results. LDCT on 10/31/19 showed mild centrilobular and paraseptal emphysema. Mild bronchiectasis in RML. Scarring in lingular and LLL. No worrisome pulmonary nodules. Impression Lung- RADS 1, negative. She had some questions about emphysema. She is on Stiolto Respimat two puffs once daily. Most recent PFTs in 2017 normal, needs repeat  Observations/Objective:  - Able to speak in full sentences. No overt shortness of breath or wheezing  Assessment and Plan:  Pulmonary emphysema/ Asthma overlap: - Stable; maintained on Stiolto respimat two puffs once daily, PRN ventolin hfa q6 hours for sob or wheezing  - PFTs in 2017 showed FEV1 1.96 (82); Ratio 88. NO significant BD response. Increased DLCO indicting air trapping  - Due for repeat PFTs   Former tobacco smoker: - Lung-RADS 1, negative. No concerning pulmonary nodules.  - Due for annual screening in 12 months (April 2022)  Follow Up Instructions:   -3-4 months with Dr. Patsey Berthold and PFTs  I discussed the assessment and treatment plan with the patient. The patient was provided an  opportunity to ask questions and all were answered. The patient agreed with the plan and demonstrated an understanding of the instructions.   The patient was advised to call back or seek an in-person evaluation if the symptoms worsen or if the condition fails to improve as anticipated.  I provided 18 minutes of non-face-to-face time during this encounter.   Martyn Ehrich, NP

## 2019-12-27 NOTE — Progress Notes (Signed)
Agree with the details of the procedure as noted by Geraldo Pitter, NP.  Renold Don, MD [facility]

## 2020-01-08 ENCOUNTER — Telehealth: Payer: Self-pay | Admitting: Pulmonary Disease

## 2020-01-08 NOTE — Telephone Encounter (Signed)
Called and spoke to pt, who had questions regarding her inhalers.  Pt stated that she received a Rx from [place] mail order for Arnuity, however per our records pt should be taking stiolto only.  Upon research, Arnuity was last refilled by our office on 04/19/2019. Pt is aware that she should only be taking Stiolto.  Pt stated that she has contacted [facility] mail order and will be mailing Rx for Arnuity back. Nothing further is needed at this time.

## 2020-02-06 ENCOUNTER — Other Ambulatory Visit: Payer: Self-pay | Admitting: Pulmonary Disease

## 2020-02-14 ENCOUNTER — Telehealth: Payer: Self-pay | Admitting: Pulmonary Disease

## 2020-02-14 NOTE — Telephone Encounter (Signed)
Pt is aware of below message/recommendations and voiced her understanding.  Nothing further is needed.

## 2020-02-14 NOTE — Telephone Encounter (Signed)
In some people with COPD, not all, tramadol can make wheezing worse.  The only way to know is to see how she reacts to the medication.  Not all people with COPD developed problems with it.  There are other medications in the propranolol class that are more selective I would encourage her to discuss these concerns with her mental health provider.

## 2020-02-14 NOTE — Telephone Encounter (Signed)
Called and spoke to pt.  Pt stated that she was prescribed Propranolol by her mental health provider. Pt stated that she googled this medication, and it says to not take it if you have COPD.  Pt would like Dr. Danella Maiers advise.   LG, please advise. thanks

## 2020-02-18 ENCOUNTER — Other Ambulatory Visit: Payer: Self-pay | Admitting: Surgery

## 2020-02-19 ENCOUNTER — Encounter
Admission: RE | Admit: 2020-02-19 | Discharge: 2020-02-19 | Disposition: A | Discharge status: Home / Self Care | Payer: Medicare HMO | Source: Ambulatory Visit | Attending: Surgery | Admitting: Surgery

## 2020-02-19 ENCOUNTER — Other Ambulatory Visit: Payer: Self-pay

## 2020-02-19 ENCOUNTER — Other Ambulatory Visit: Admission: RE | Admit: 2020-02-19 | Payer: Medicare HMO | Source: Ambulatory Visit

## 2020-02-19 DIAGNOSIS — E785 Hyperlipidemia, unspecified: Secondary | ICD-10-CM | POA: Insufficient documentation

## 2020-02-19 DIAGNOSIS — Z01818 Encounter for other preprocedural examination: Secondary | ICD-10-CM | POA: Insufficient documentation

## 2020-02-19 DIAGNOSIS — I10 Essential (primary) hypertension: Secondary | ICD-10-CM | POA: Diagnosis not present

## 2020-02-19 DIAGNOSIS — Z20822 Contact with and (suspected) exposure to covid-19: Secondary | ICD-10-CM | POA: Diagnosis not present

## 2020-02-19 DIAGNOSIS — E118 Type 2 diabetes mellitus with unspecified complications: Secondary | ICD-10-CM | POA: Insufficient documentation

## 2020-02-19 HISTORY — DX: Chronic obstructive pulmonary disease, unspecified: J44.9

## 2020-02-19 HISTORY — DX: Anemia, unspecified: D64.9

## 2020-02-19 LAB — COMPREHENSIVE METABOLIC PANEL
ALT: 14 U/L (ref 0–44)
AST: 18 U/L (ref 15–41)
Albumin: 4.1 g/dL (ref 3.5–5.0)
Alkaline Phosphatase: 50 U/L (ref 38–126)
Anion gap: 7 (ref 5–15)
BUN: 15 mg/dL (ref 8–23)
CO2: 25 mmol/L (ref 22–32)
Calcium: 8.8 mg/dL — ABNORMAL LOW (ref 8.9–10.3)
Chloride: 102 mmol/L (ref 98–111)
Creatinine, Ser: 0.8 mg/dL (ref 0.44–1.00)
GFR calc Af Amer: >60 mL/min (ref >60)
GFR calc non Af Amer: >60 mL/min (ref >60)
Glucose, Bld: 128 mg/dL — ABNORMAL HIGH (ref 70–99)
Potassium: 3.7 mmol/L (ref 3.5–5.1)
Sodium: 134 mmol/L — ABNORMAL LOW (ref 135–145)
Total Bilirubin: 0.5 mg/dL (ref 0.3–1.2)
Total Protein: 7.5 g/dL (ref 6.5–8.1)

## 2020-02-19 LAB — CBC
HCT: 37.9 % (ref 36.0–46.0)
Hemoglobin: 11.7 g/dL — ABNORMAL LOW (ref 12.0–15.0)
MCH: 23.9 pg — ABNORMAL LOW (ref 26.0–34.0)
MCHC: 30.9 g/dL (ref 30.0–36.0)
MCV: 77.5 fL — ABNORMAL LOW (ref 80.0–100.0)
Platelets: 248 10*3/uL (ref 150–400)
RBC: 4.89 MIL/uL (ref 3.87–5.11)
RDW: 18.7 % — ABNORMAL HIGH (ref 11.5–15.5)
WBC: 6.4 10*3/uL (ref 4.0–10.5)
nRBC: 0 % (ref 0.0–0.2)

## 2020-02-19 LAB — PROTIME-INR
INR: 1.1 (ref 0.8–1.2)
Prothrombin Time: 13.5 seconds (ref 11.4–15.2)

## 2020-02-19 LAB — SARS CORONAVIRUS 2 (TAT 6-24 HRS): SARS Coronavirus 2: NEGATIVE

## 2020-02-19 NOTE — Patient Instructions (Signed)
Your procedure is scheduled on: 02/21/20 Report to [facility]. To find out your arrival time please call [phone] between 1PM - 3PM on 02/20/20.  Remember: Instructions that are not followed completely may result in serious medical risk, up to and including death, or upon the discretion of your surgeon and anesthesiologist your surgery may need to be rescheduled.     _X__ 1. Do not eat food after midnight the night before your procedure.                 No gum chewing or hard candies. You may drink clear liquids up to 2 hours                 before you are scheduled to arrive for your surgery- DO not drink clear                 liquids within 2 hours of the start of your surgery.                 Clear Liquids include:  water, apple juice without pulp, clear carbohydrate                 drink such as Clearfast or Gatorade, Black Coffee or Tea (Do not add                 anything to coffee or tea). Diabetics water only  __X__2.  On the morning of surgery brush your teeth with toothpaste and water, you                 may rinse your mouth with mouthwash if you wish.  Do not swallow any              toothpaste of mouthwash.     _X__ 3.  No Alcohol for 24 hours before or after surgery.   _X__ 4.  Do Not Smoke or use e-cigarettes For 24 Hours Prior to Your Surgery.                 Do not use any chewable tobacco products for at least 6 hours prior to                 surgery.  ____  5.  Bring all medications with you on the day of surgery if instructed.   __X__  6.  Notify your doctor if there is any change in your medical condition      (cold, fever, infections).     Do not wear jewelry, make-up, hairpins, clips or nail polish. Do not wear lotions, powders, or perfumes.  Do not shave 48 hours prior to surgery. Men may shave face and neck. Do not bring valuables to the hospital.    [facility] is not responsible for any belongings or  valuables.  Contacts, dentures/partials or body piercings may not be worn into surgery. Bring a case for your contacts, glasses or hearing aids, a denture cup will be supplied. Leave your suitcase in the car. After surgery it may be brought to your room. For patients admitted to the hospital, discharge time is determined by your treatment team.   Patients discharged the day of surgery will not be allowed to drive home.   Please read over the following fact sheets that you were given:   MRSA Information  __X__ Take these medicines the morning of surgery with A SIP OF WATER:  1. ARIPiprazole (ABILIFY) 5 MG tablet  2. buPROPion (WELLBUTRIN XL) 150 MG 24 hr tablet  3. gabapentin (NEURONTIN) 600 MG tablet  4. oxybutynin (DITROPAN) 5 MG tablet  5. pantoprazole (PROTONIX) 40 MG tablet  6.  ____ Fleet Enema (as directed)   __X__ Use CHG Soap/SAGE wipes as directed  ____ Use inhalers on the day of surgery  ____ Stop metformin/Janumet/Farxiga 2 days prior to surgery    ____ Take 1/2 of usual insulin dose the night before surgery. No insulin the morning          of surgery.   ____ Stop Blood Thinners Coumadin/Plavix/Xarelto/Pleta/Pradaxa/Eliquis/Effient/Aspirin  on   Or contact your Surgeon, Cardiologist or Medical Doctor regarding  ability to stop your blood thinners  __X__ Stop Anti-inflammatories 7 days before surgery such as Advil, Ibuprofen, Motrin,  BC or Goodies Powder, Naprosyn, Naproxen, Aleve, Aspirin    __X__ Stop all herbal supplements, fish oil or vitamin E until after surgery.    ____ Bring C-Pap to the hospital.    [place] 2 HOURS BEFORE ARRIVING

## 2020-02-21 ENCOUNTER — Other Ambulatory Visit: Payer: Self-pay

## 2020-02-21 ENCOUNTER — Encounter: Payer: Self-pay | Admitting: Surgery

## 2020-02-21 ENCOUNTER — Ambulatory Visit
Admission: RE | Admit: 2020-02-21 | Discharge: 2020-02-21 | Disposition: A | Discharge status: Home / Self Care | Payer: Medicare HMO | Attending: Surgery | Admitting: Surgery

## 2020-02-21 ENCOUNTER — Ambulatory Visit: Payer: Medicare HMO | Admitting: Anesthesiology

## 2020-02-21 ENCOUNTER — Encounter
Admission: RE | Disposition: A | Discharge status: Home / Self Care | Payer: Self-pay | Source: Home / Self Care | Attending: Surgery

## 2020-02-21 DIAGNOSIS — Z7984 Long term (current) use of oral hypoglycemic drugs: Secondary | ICD-10-CM | POA: Insufficient documentation

## 2020-02-21 DIAGNOSIS — K219 Gastro-esophageal reflux disease without esophagitis: Secondary | ICD-10-CM | POA: Diagnosis not present

## 2020-02-21 DIAGNOSIS — F319 Bipolar disorder, unspecified: Secondary | ICD-10-CM | POA: Insufficient documentation

## 2020-02-21 DIAGNOSIS — Z7983 Long term (current) use of bisphosphonates: Secondary | ICD-10-CM | POA: Diagnosis not present

## 2020-02-21 DIAGNOSIS — G5602 Carpal tunnel syndrome, left upper limb: Secondary | ICD-10-CM | POA: Diagnosis present

## 2020-02-21 DIAGNOSIS — Z79899 Other long term (current) drug therapy: Secondary | ICD-10-CM | POA: Insufficient documentation

## 2020-02-21 DIAGNOSIS — Z96659 Presence of unspecified artificial knee joint: Secondary | ICD-10-CM | POA: Diagnosis not present

## 2020-02-21 HISTORY — PX: CARPAL TUNNEL RELEASE: SHX101

## 2020-02-21 LAB — GLUCOSE, CAPILLARY
Glucose-Capillary: 75 mg/dL (ref 70–99)
Glucose-Capillary: 213 mg/dL — ABNORMAL HIGH (ref 70–99)
Glucose-Capillary: 101 mg/dL — ABNORMAL HIGH (ref 70–99)

## 2020-02-21 SURGERY — RELEASE, CARPAL TUNNEL, ENDOSCOPIC
Anesthesia: General | Site: Wrist | Laterality: Left

## 2020-02-21 MED ORDER — CEFAZOLIN SODIUM-DEXTROSE 2-4 GM/100ML-% IV SOLN
2.0000 g | INTRAVENOUS | Status: AC
  Administered 2020-02-21: 2 g via INTRAVENOUS

## 2020-02-21 MED ORDER — FENTANYL CITRATE (PF) 100 MCG/2ML IJ SOLN
INTRAMUSCULAR | Status: DC | PRN
  Administered 2020-02-21 (×4): 25 ug via INTRAVENOUS

## 2020-02-21 MED ORDER — ONDANSETRON HCL 4 MG PO TABS: 4.0000 mg | ORAL_TABLET | Freq: Four times a day (QID) | ORAL | Status: DC | PRN

## 2020-02-21 MED ORDER — METOCLOPRAMIDE HCL 10 MG PO TABS: 5.0000 mg | ORAL_TABLET | Freq: Three times a day (TID) | ORAL | Status: DC | PRN

## 2020-02-21 MED ORDER — ONDANSETRON 4 MG PO TBDP
4.0000 mg | ORAL_TABLET | Freq: Three times a day (TID) | ORAL | 1 refills | Status: DC | PRN
Start: 2020-02-21 — End: 2020-11-05

## 2020-02-21 MED ORDER — MIDAZOLAM HCL 2 MG/2ML IJ SOLN
INTRAMUSCULAR | Status: AC
  Filled 2020-02-21: qty 2

## 2020-02-21 MED ORDER — ONDANSETRON HCL 4 MG/2ML IJ SOLN
INTRAMUSCULAR | Status: AC
  Filled 2020-02-21: qty 2

## 2020-02-21 MED ORDER — CEFAZOLIN SODIUM-DEXTROSE 2-4 GM/100ML-% IV SOLN
INTRAVENOUS | Status: AC
  Filled 2020-02-21: qty 100

## 2020-02-21 MED ORDER — SODIUM CHLORIDE FLUSH 0.9 % IV SOLN
INTRAVENOUS | Status: AC
  Filled 2020-02-21: qty 10

## 2020-02-21 MED ORDER — DEXTROSE-NACL 5-0.45 % IV SOLN: INTRAVENOUS | Status: DC

## 2020-02-21 MED ORDER — FENTANYL CITRATE (PF) 100 MCG/2ML IJ SOLN
25.0000 ug | INTRAMUSCULAR | Status: DC | PRN
  Administered 2020-02-21: 25 ug via INTRAVENOUS

## 2020-02-21 MED ORDER — PROPOFOL 10 MG/ML IV BOLUS
INTRAVENOUS | Status: AC
  Filled 2020-02-21: qty 20

## 2020-02-21 MED ORDER — CHLORHEXIDINE GLUCONATE 0.12 % MT SOLN
OROMUCOSAL | Status: AC
  Administered 2020-02-21: 15 mL via OROMUCOSAL
  Filled 2020-02-21: qty 15

## 2020-02-21 MED ORDER — FENTANYL CITRATE (PF) 100 MCG/2ML IJ SOLN
INTRAMUSCULAR | Status: AC
  Administered 2020-02-21: 25 ug via INTRAVENOUS
  Filled 2020-02-21: qty 2

## 2020-02-21 MED ORDER — PROMETHAZINE HCL 25 MG/ML IJ SOLN
INTRAMUSCULAR | Status: AC
  Filled 2020-02-21: qty 1

## 2020-02-21 MED ORDER — LIDOCAINE HCL (CARDIAC) PF 100 MG/5ML IV SOSY
PREFILLED_SYRINGE | INTRAVENOUS | Status: DC | PRN
  Administered 2020-02-21: 20 mg via INTRAVENOUS

## 2020-02-21 MED ORDER — LIDOCAINE HCL (PF) 2 % IJ SOLN
INTRAMUSCULAR | Status: AC
  Filled 2020-02-21: qty 5

## 2020-02-21 MED ORDER — ACETAMINOPHEN 325 MG PO TABS: 325.0000 mg | ORAL_TABLET | ORAL | Status: DC | PRN

## 2020-02-21 MED ORDER — PROPOFOL 10 MG/ML IV BOLUS
INTRAVENOUS | Status: DC | PRN
  Administered 2020-02-21: 10 mg via INTRAVENOUS
  Administered 2020-02-21: 20 mg via INTRAVENOUS
  Administered 2020-02-21: 170 mg via INTRAVENOUS

## 2020-02-21 MED ORDER — ORAL CARE MOUTH RINSE: 15.0000 mL | Freq: Once | OROMUCOSAL | Status: AC

## 2020-02-21 MED ORDER — BUPIVACAINE HCL (PF) 0.5 % IJ SOLN
INTRAMUSCULAR | Status: DC | PRN
  Administered 2020-02-21: 10 mL

## 2020-02-21 MED ORDER — CHLORHEXIDINE GLUCONATE 0.12 % MT SOLN: 15.0000 mL | Freq: Once | OROMUCOSAL | Status: AC

## 2020-02-21 MED ORDER — ACETAMINOPHEN 160 MG/5ML PO SOLN
325.0000 mg | ORAL | Status: DC | PRN
  Filled 2020-02-21: qty 20.3

## 2020-02-21 MED ORDER — ONDANSETRON HCL 4 MG/2ML IJ SOLN: 4.0000 mg | Freq: Four times a day (QID) | INTRAMUSCULAR | Status: DC | PRN

## 2020-02-21 MED ORDER — POTASSIUM CHLORIDE IN NACL 20-0.9 MEQ/L-% IV SOLN: INTRAVENOUS | Status: DC

## 2020-02-21 MED ORDER — PROMETHAZINE HCL 25 MG/ML IJ SOLN
6.2500 mg | INTRAMUSCULAR | Status: DC | PRN
  Administered 2020-02-21: 6.25 mg via INTRAVENOUS

## 2020-02-21 MED ORDER — BUPIVACAINE HCL (PF) 0.5 % IJ SOLN
INTRAMUSCULAR | Status: AC
  Filled 2020-02-21: qty 30

## 2020-02-21 MED ORDER — HYDROCODONE-ACETAMINOPHEN 5-325 MG PO TABS: 1.0000 | ORAL_TABLET | ORAL | Status: DC | PRN

## 2020-02-21 MED ORDER — MIDAZOLAM HCL 2 MG/2ML IJ SOLN
INTRAMUSCULAR | Status: DC | PRN
  Administered 2020-02-21: 2 mg via INTRAVENOUS

## 2020-02-21 MED ORDER — METOCLOPRAMIDE HCL 5 MG/ML IJ SOLN: 5.0000 mg | Freq: Three times a day (TID) | INTRAMUSCULAR | Status: DC | PRN

## 2020-02-21 MED ORDER — SODIUM CHLORIDE 0.9 % IV SOLN: INTRAVENOUS | Status: DC

## 2020-02-21 MED ORDER — FENTANYL CITRATE (PF) 100 MCG/2ML IJ SOLN
INTRAMUSCULAR | Status: AC
  Filled 2020-02-21: qty 2

## 2020-02-21 MED ORDER — HYDROCODONE-ACETAMINOPHEN 5-325 MG PO TABS: 1.0000 | ORAL_TABLET | Freq: Four times a day (QID) | ORAL | 0 refills | Status: DC | PRN

## 2020-02-21 MED ORDER — EPHEDRINE SULFATE 50 MG/ML IJ SOLN
INTRAMUSCULAR | Status: DC | PRN
  Administered 2020-02-21: 10 mg via INTRAVENOUS

## 2020-02-21 SURGICAL SUPPLY — 32 items
APL PRP STRL LF DISP 70% ISPRP (MISCELLANEOUS) ×1
BNDG COHESIVE 4X5 TAN STRL (GAUZE/BANDAGES/DRESSINGS) ×3 IMPLANT
BNDG ELASTIC 2X5.8 VLCR STR LF (GAUZE/BANDAGES/DRESSINGS) ×3 IMPLANT
BNDG ESMARK 4X12 TAN STRL LF (GAUZE/BANDAGES/DRESSINGS) ×3 IMPLANT
CANISTER SUCT 1200ML W/VALVE (MISCELLANEOUS) ×3 IMPLANT
CHLORAPREP W/TINT 26 (MISCELLANEOUS) ×3 IMPLANT
CORD BIP STRL DISP 12FT (MISCELLANEOUS) ×3 IMPLANT
COVER WAND RF STERILE (DRAPES) ×3 IMPLANT
CUFF TOURN SGL QUICK 18X4 (TOURNIQUET CUFF) ×3 IMPLANT
DRAPE SURG 17X11 SM STRL (DRAPES) ×3 IMPLANT
FORCEPS JEWEL BIP 4-3/4 STR (INSTRUMENTS) ×3 IMPLANT
GAUZE SPONGE 4X4 12PLY STRL (GAUZE/BANDAGES/DRESSINGS) ×3 IMPLANT
GAUZE XEROFORM 1X8 LF (GAUZE/BANDAGES/DRESSINGS) ×3 IMPLANT
GLOVE BIO SURGEON STRL SZ8 (GLOVE) ×3 IMPLANT
GLOVE INDICATOR 8.0 STRL GRN (GLOVE) ×3 IMPLANT
GOWN STRL REUS W/ TWL LRG LVL3 (GOWN DISPOSABLE) ×1 IMPLANT
GOWN STRL REUS W/ TWL XL LVL3 (GOWN DISPOSABLE) ×1 IMPLANT
GOWN STRL REUS W/TWL LRG LVL3 (GOWN DISPOSABLE) ×3
GOWN STRL REUS W/TWL XL LVL3 (GOWN DISPOSABLE) ×3
KIT CARPAL TUNNEL (MISCELLANEOUS) ×3
KIT ESCP INSRT D SLOT CANN KN (MISCELLANEOUS) ×1 IMPLANT
KIT TURNOVER KIT A (KITS) ×3 IMPLANT
NS IRRIG 500ML POUR BTL (IV SOLUTION) ×3 IMPLANT
PACK EXTREMITY (MISCELLANEOUS) ×3 IMPLANT
SPLINT WRIST LG LT TX990309 (SOFTGOODS) IMPLANT
SPLINT WRIST LG RT TX900304 (SOFTGOODS) IMPLANT
SPLINT WRIST M LT TX990308 (SOFTGOODS) IMPLANT
SPLINT WRIST M RT TX990303 (SOFTGOODS) IMPLANT
SPLINT WRIST XL LT TX990310 (SOFTGOODS) IMPLANT
SPLINT WRIST XL RT TX990305 (SOFTGOODS) IMPLANT
STOCKINETTE IMPERVIOUS 9X36 MD (GAUZE/BANDAGES/DRESSINGS) ×3 IMPLANT
SUT PROLENE 4 0 PS 2 18 (SUTURE) ×3 IMPLANT

## 2020-02-21 NOTE — Op Note (Signed)
02/21/2020  2:16 PM  Patient:   Kathryn Ramirez  Pre-Op Diagnosis:   Left carpal tunnel syndrome.  Post-Op Diagnosis:   Same.  Procedure:   Endoscopic left carpal tunnel release.  Surgeon:   Pascal Lux, MD  Assistant:   Gabrielle Dare, PA-S  Anesthesia:   General LMA  Findings:   As above.  Complications:   None  EBL:   0 cc  Fluids:   500 cc crystalloid  TT:   20 minutes at 250 mmHg  Drains:   None  Closure:   4-0 Prolene interrupted sutures  Brief Clinical Note:   The patient is a 69 year old female with a history of bilateral hand and wrist pain and paresthesias. Her symptoms have persisted despite medications, activity modification, etc. Her history and examination are consistent with carpal tunnel syndrome, confirmed by EMG. The patient presents at this time for an endoscopic left carpal tunnel release.   Procedure:   The patient was brought into the operating room and lain in the supine position. After adequate general laryngeal mask anesthesia was obtained, the left hand and upper extremity were prepped with ChloraPrep solution before being draped sterilely. Preoperative antibiotics were administered. A timeout was performed to verify the appropriate surgical site before the limb was exsanguinated with an Esmarch and the tourniquet inflated to 250 mmHg. An approximately 1.5-2 cm incision was made over the volar wrist flexion crease, centered over the palmaris longus tendon. The incision was carried down through the subcutaneous tissues with care taken to identify and protect any neurovascular structures. The distal forearm fascia was penetrated just proximal to the transverse carpal ligament. The soft tissues were released off the superficial and deep surfaces of the distal forearm fascia and this was released proximally for 3-4 cm under direct visualization.  Attention was directed distally. The Soil scientist was passed beneath the transverse carpal ligament along  the ulnar aspect of the carpal tunnel and used to release any adhesions as well as to remove any adherent synovial tissue before first the smaller then the larger of the two dilators were passed beneath the transverse carpal ligament along the ulnar margin of the carpal tunnel. The slotted cannula was introduced and the endoscope was placed into the slotted cannula and the undersurface of the transverse carpal ligament visualized. The distal margin of the transverse carpal ligament was marked by placing a 25-gauge needle percutaneously at Matheny cardinal point so that it entered the distal portion of the slotted cannula. Under endoscopic visualization, the transverse carpal ligament was released from proximal to distal using the end-cutting blade. A second pass was performed to ensure complete release of the ligament. The adequacy of release was verified both endoscopically and by palpation using the freer elevator.  The wound was irrigated thoroughly with sterile saline solution before being closed using 4-0 Prolene interrupted sutures. A total of 10 cc of 0.5% plain Sensorcaine was injected in and around the incision before a sterile bulky dressing was applied to the wound. The patient was placed into a volar wrist splint before being awakened and returned to the recovery room in satisfactory condition after tolerating the procedure well.

## 2020-02-21 NOTE — Anesthesia Procedure Notes (Signed)
Procedure Name: LMA Insertion Date/Time: 02/21/2020 1:27 PM Performed by: Lerry Liner, CRNA Pre-anesthesia Checklist: Patient identified, Emergency Drugs available, Suction available, Patient being monitored and Timeout performed Patient Re-evaluated:Patient Re-evaluated prior to induction Preoxygenation: Pre-oxygenation with 100% oxygen Induction Type: IV induction Ventilation: Mask ventilation without difficulty LMA: LMA inserted LMA Size: 4.0 Number of attempts: 1 Tube secured with: Tape Dental Injury: Teeth and Oropharynx as per pre-operative assessment

## 2020-02-21 NOTE — Telephone Encounter (Signed)
Will close encounter, as pt had OV with Derl Barrow, NP on 11/21/2019

## 2020-02-21 NOTE — Transfer of Care (Signed)
Immediate Anesthesia Transfer of Care Note  Patient: Deatra Robinson  Procedure(s) Performed: CARPAL TUNNEL RELEASE ENDOSCOPIC (Left Wrist)  Patient Location: PACU  Anesthesia Type:General  Level of Consciousness: drowsy  Airway & Oxygen Therapy: Patient Spontanous Breathing and Patient connected to nasal cannula oxygen  Post-op Assessment: Report given to RN  Post vital signs: stable  Last Vitals:  Vitals Value Taken Time  BP 117/69 02/21/20 1415  Temp    Pulse 83 02/21/20 1415  Resp 13 02/21/20 1415  SpO2 94 % 02/21/20 1415  Vitals shown include unvalidated device data.  Last Pain:  Vitals:   02/21/20 1159  TempSrc: Temporal  PainSc: 5          Complications: No complications documented.

## 2020-02-21 NOTE — Discharge Instructions (Addendum)
AMBULATORY SURGERY  DISCHARGE INSTRUCTIONS   1) The drugs that you were given will stay in your system until tomorrow so for the next 24 hours you should not:  A) Drive an automobile B) Make any legal decisions C) Drink any alcoholic beverage   2) You may resume regular meals tomorrow.  Today it is better to start with liquids and gradually work up to solid foods.  You may eat anything you prefer, but it is better to start with liquids, then soup and crackers, and gradually work up to solid foods.   3) Please notify your doctor immediately if you have any unusual bleeding, trouble breathing, redness and pain at the surgery site, drainage, fever, or pain not relieved by medication. 4)   5) Your post-operative visit with Dr.                                     is: Date:                        Time:    Please call to schedule your post-operative visit.  6) Additional Instructions:    Orthopedic discharge instructions: Keep dressing dry and intact. Keep hand elevated above heart level. May shower after dressing removed on postop day 4 (Monday). Cover sutures with Band-Aids after drying off. Apply ice to affected area frequently. Take ibuprofen 600-800 mg TID with meals for 7-10 days, then as necessary. Take ES Tylenol or pain medication as prescribed when needed.  Return for follow-up in 10-14 days or as scheduled.

## 2020-02-21 NOTE — Anesthesia Preprocedure Evaluation (Signed)
Anesthesia Evaluation  Patient identified by MRN, date of birth, ID band Patient awake    Reviewed: Allergy & Precautions, H&P , NPO status , reviewed documented beta blocker date and time   Airway Mallampati: III  TM Distance: >3 FB Neck ROM: limited    Dental  (+) Edentulous Upper, Edentulous Lower   Pulmonary asthma , COPD, former smoker,    Pulmonary exam normal        Cardiovascular hypertension, Normal cardiovascular exam     Neuro/Psych PSYCHIATRIC DISORDERS Depression Bipolar Disorder  Neuromuscular disease    GI/Hepatic GERD  Medicated and Controlled,(+) Hepatitis -  Endo/Other  diabetes  Renal/GU      Musculoskeletal  (+) Arthritis ,   Abdominal   Peds  Hematology  (+) Blood dyscrasia, anemia ,   Anesthesia Other Findings Past Medical History: No date: Acid reflux disease No date: Anemia No date: Asthma No date: Bipolar 1 disorder (HCC) No date: COPD (chronic obstructive pulmonary disease) (HCC) No date: Diabetes mellitus without complication (HCC) FS 97'Q today, will piggyback D5 1/2NS and re-check 2010: History of hepatitis C     Comment:  took interferon x 1 year No date: Hypertension No date: Liver hemangioma No date: Lung nodule No date: Psoriasis No date: Rheumatoid arthritis (HCC) No date: Tardive dyskinesia No date: Wears dentures     Comment:  full upper and lower Past Surgical History: No date: CATARACT EXTRACTION W/ INTRAOCULAR LENS  IMPLANT, BILATERAL 2017: CERVICAL SPINE SURGERY     Comment:  [facility] 09/14/2016: COLONOSCOPY WITH PROPOFOL; N/A     Comment:  Procedure: COLONOSCOPY WITH PROPOFOL;  Surgeon: Lucilla Lame, MD;  Location: [facility] ENDOSCOPY;  Service: Endoscopy;              Laterality: N/A; 09/14/2016: ESOPHAGOGASTRODUODENOSCOPY (EGD) WITH PROPOFOL; N/A     Comment:  Procedure: ESOPHAGOGASTRODUODENOSCOPY (EGD) WITH               PROPOFOL;  Surgeon:  Lucilla Lame, MD;  Location: [facility]               ENDOSCOPY;  Service: Endoscopy;  Laterality: N/A; 01/13/2016: FLEXIBLE BRONCHOSCOPY; N/A     Comment:  Procedure: FLEXIBLE BRONCHOSCOPY;  Surgeon: Vilinda Boehringer, MD;  Location: [facility] ORS;  Service:               Cardiopulmonary;  Laterality: N/A; No date: JOINT REPLACEMENT No date: NEUROPLASTY MAJOR NERVE No date: POLYPECTOMY No date: THROAT SURGERY 06/15/2017: TOTAL KNEE ARTHROPLASTY; Right     Comment:  Procedure: TOTAL KNEE ARTHROPLASTY;  Surgeon: Earnestine Leys, MD;  Location: [facility] ORS;  Service: Orthopedics;                Laterality: Right; BMI    Body Mass Index: 32.61 kg/m     Reproductive/Obstetrics                             Anesthesia Physical Anesthesia Plan  ASA: III  Anesthesia Plan: General   Post-op Pain Management:    Induction: Intravenous  PONV Risk Score and Plan: 3 and Ondansetron, Midazolam, Treatment may vary due to age or medical condition and Dexamethasone  Airway Management Planned: LMA  Additional Equipment:   Intra-op Plan:   Post-operative Plan: Extubation in OR  Informed Consent: I have reviewed the patients History and Physical, chart, labs and discussed the procedure including the risks, benefits and alternatives for the proposed anesthesia with the patient or authorized representative who has indicated his/her understanding and acceptance.     Dental Advisory Given  Plan Discussed with: CRNA  Anesthesia Plan Comments:         Anesthesia Quick Evaluation

## 2020-02-21 NOTE — H&P (Signed)
History of Present Illness:  Kathryn Ramirez is a 70 y.o. female who presents for evaluation and treatment of her bilateral hand and wrist pain and paresthesias, left more symptomatic than right. The patient notes that the symptoms have been present for many months and developed without any specific cause or injury. Her symptoms will occasionally awaken her from sleep. They also are aggravated by any repetitive holding or grasping activities. She does not recall which fingers are more aggravated than others. She tried wearing wrist splints but felt that the splints only aggravated her symptoms. She has been taking over-the-counter medications with limited benefit. She saw Dr. Melrose Nakayama of neurology who performed an EMG of both hands. This study demonstrated evidence of a moderate carpal tunnel syndrome of the right and mild carpal tunnel syndrome on the left. The patient has been referred to me for further evaluation and treatment of her bilateral hand and wrist symptoms.  Current Outpatient Medications: . acetaminophen (TYLENOL) 650 MG ER tablet Take 650 mg by mouth every 8 (eight) hours as needed for Pain.  [name] albuterol 90 mcg/actuation inhaler Inhale 2 inhalations into the lungs every 4 (four) hours as needed for Wheezing.  [name] alendronate (FOSAMAX) 70 MG tablet Take 70 mg by mouth every 7 (seven) days  . ARIPiprazole (ABILIFY) 5 MG tablet Take 1.5 tablets (7.5 mg total) by mouth every morning. (Patient taking differently: Take 7.25 mg by mouth every morning ) 45 tablet 2  . blood glucose diagnostic test strip 1 each (1 strip total) by XX route 3 (three) times daily TRUE METRIX STRIPS. E11.9.Use as instructed. 100 each 12  . blood glucose meter kit by XX route as directed 1 each 0  . buPROPion (WELLBUTRIN SR) 150 MG SR tablet Take 150 mg by mouth once daily  . diazepam (VALIUM ORAL) Take 1 mg by mouth nightly  . empagliflozin-metformin (SYNJARDY XR) 25-1,000 mg XR 24 hr biphasic tablet Take 1 tablet by mouth  daily with breakfast 30 tablet 6  . gabapentin (NEURONTIN) 600 MG tablet TAKE (1) TABLET BY MOUTH THREE TIMES A DAY 90 tablet 0  . lancets Use 1 each 3 (three) times daily Use as instructed. 100 each 12  . melatonin 5 mg Tab Take 5 mg by mouth nightly as needed  . oxybutynin (DITROPAN-XL) 5 MG XL tablet oxybutynin chloride ER 5 mg tablet,extended release 24 hr  . pantoprazole (PROTONIX) 40 MG DR tablet Take 40 mg by mouth once daily  . rosuvastatin (CRESTOR) 10 MG tablet Take 10 mg by mouth once daily  . tiotropium-olodaterol (STIOLTO RESPIMAT) 2.5-2.5 mcg/actuation inhaler Inhale 2 inhalations into the lungs once daily In AM  . traZODone (DESYREL) 100 MG tablet Take 150 mg by mouth nightly   No current Epic-ordered facility-administered medications on file.   Allergies:  . Codeine Nausea  . Haldol [Haloperidol] Other (See Comments)  Reaction: Pt "felt like her neck was going to snap." Sever neck stiffness.  . Meloxicam Other (See Comments)  GERD . Trileptal [Oxcarbazepine] Swelling   Past Medical History:  . Alcohol abuse  . Arthritis  . Bipolar affect, depressed (CMS-HCC)  sees psych  . GERD (gastroesophageal reflux disease)  . Hep C w/o coma, chronic (CMS-HCC)  Resolved.  . Hyperlipidemia  . Osteoarthritis  . Pulmonary nodule  . Rheumatoid arthritis (CMS-HCC)  . Rheumatoid arthritis(714.0) (CMS-HCC)  . Tardive dyskinesia  seeing neurology  . Urinary incontinence   Past Surgical History:  . cervical discectomy and fusion 03/18/2016  . COLONOSCOPY  .  neoplasty left leg  . NEUROPLASTY VOCAL CORD  questionable if exact procedure  . REPLACEMENT TOTAL KNEE   Family History: . Alcohol abuse Mother  . Anxiety Mother  . Breast cancer Mother  . Depression Mother  . Cirrhosis Mother  . Esophageal cancer Mother  . Anxiety Paternal Grandmother  . Asthma Brother  . Colon cancer Neg Hx  . Colon polyps Neg Hx  . Rectal cancer Neg Hx   Social History:   Socioeconomic  History  . Marital status: Divorced  Spouse name: Not on file  . Number of children: Not on file  . Years of education: Not on file  . Highest education level: Not on file  Occupational History  . Not on file  Tobacco Use  . Smoking status: Former Smoker  Packs/day: 0.50  Years: 40.00  Pack years: 20.00  Types: Cigarettes  Quit date: 01/05/2015  Years since quitting: 5.1  . Smokeless tobacco: Never Used  . Tobacco comment: not working . on disability. worked in the past medical assistance. Lives with daughter and husband on list for apparement  Substance and Sexual Activity  . Alcohol use: No  Alcohol/week: 0.0 standard drinks  Comment: stopped March 31 2012  . Drug use: No  Comment: 10 cigarrettes a day will ask her PCP to help quit  . Sexual activity: Not Currently  Partners: Male  Other Topics Concern  . Not on file  Social History Narrative  Single, lives with daughter. Moved to area from [place], [place] November 2014 because daughter felt her caregivers in [facility] were not adequate. Two year college degree. Unemployed. 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 No exercise.   Social Determinants of Health   Financial Resource Strain:  . Difficulty of Paying Living Expenses:  Food Insecurity:  . Worried About Charity fundraiser in the Last Year:  . Arboriculturist in the Last Year:  Transportation Needs:  . Film/video editor (Medical):  [name] Lack of Transportation (Non-Medical):   Review of Systems:  A comprehensive 14 point ROS was performed, reviewed, and the pertinent orthopaedic findings are documented in the HPI.  Physical Exam: Vitals:  02/13/20 1254  BP: 110/70  Weight: 88 kg (194 lb)  Height: 162.6 cm (5' 4.02")  PainSc: 7  PainLoc: Hand   General/Constitutional: The patient appears to be well-nourished, well-developed, and in no acute distress. Neuro/Psych: Normal mood and affect, oriented to person, place and  time. Eyes: Non-icteric. Pupils are equal, round, and reactive to light, and exhibit synchronous movement. ENT: Unremarkable. Lymphatic: No palpable adenopathy. Respiratory: Lungs clear to auscultation, Normal chest excursion, No wheezes and Non-labored breathing Cardiovascular: Regular rate and rhythm. No murmurs. and No edema, swelling or tenderness, except as noted in detailed exam. Integumentary: No impressive skin lesions present, except as noted in detailed exam. Musculoskeletal: Unremarkable, except as noted in detailed exam.  Left hand/wrist exam: Skin inspection of the left hand is unremarkable. No swelling, erythema, ecchymosis, abrasions, or other skin abnormalities are identified. There is no tenderness to palpation over the wrist or hand region. She exhibits full active and passive range of motion of the left wrist without any pain or catching. She also is able actively flex and extend all digits without any pain or triggering. She is neurovascularly intact to all digits. She has a negative Phalen's test, but a minimally positive Tinel's over the carpal tunnel.  X-rays/MRI/Lab data:  A recent EMG of both hands  and upper extremities is available for review. By report, the study demonstrates evidence of moderate carpal tunnel syndrome on the right and mild carpal tunnel syndrome on the left. This report was reviewed by myself and discussed with the patient.  Assessment: . Carpal tunnel syndrome, left   Plan: The treatment options were discussed with the patient. In addition, patient educational materials were provided regarding the diagnosis and treatment options. Although the EMG results suggest that the carpal tunnel syndrome on the right is more severe, the patient is more symptomatic on the left. She is quite frustrated by the symptoms and functional limitations, and is ready to consider more aggressive treatment options. Therefore, I have recommended a surgical procedure,  specifically an endoscopic left carpal tunnel release. The procedure was discussed with the patient, as were the potential risks (including bleeding, infection, nerve and/or blood vessel injury, persistent or recurrent pain/paresthesias, weakness of grip, need for further surgery, blood clots, strokes, heart attacks and/or arhythmias, pneumonia, etc.) and benefits. The patient states his/her understanding and wishes to proceed. All of the patient's questions and concerns were answered. She can call any time with further concerns. She will follow up post-surgery, routine.    H&P reviewed and patient re-examined. No changes.

## 2020-02-22 ENCOUNTER — Encounter: Payer: Self-pay | Admitting: Surgery

## 2020-02-26 NOTE — Anesthesia Postprocedure Evaluation (Signed)
Anesthesia Post Note  Patient: Deatra Robinson  Procedure(s) Performed: CARPAL TUNNEL RELEASE ENDOSCOPIC (Left Wrist)  Patient location during evaluation: PACU Anesthesia Type: General Level of consciousness: awake and alert Pain management: pain level controlled Vital Signs Assessment: post-procedure vital signs reviewed and stable Respiratory status: spontaneous breathing, nonlabored ventilation and respiratory function stable Cardiovascular status: blood pressure returned to baseline and stable Postop Assessment: no apparent nausea or vomiting Anesthetic complications: no   No complications documented.   Last Vitals:  Vitals:   02/21/20 1511 02/21/20 1520  BP: (!) 153/84 (!) 152/82  Pulse: 64 59  Resp: 17 16  Temp: (!) 36.1 C (!) 36.1 C  SpO2: 93% 96%    Last Pain:  Vitals:   02/22/20 0810  TempSrc:   PainSc: 0-No pain                 Alphonsus Sias

## 2020-03-26 ENCOUNTER — Telehealth: Payer: Self-pay | Admitting: Pulmonary Disease

## 2020-03-26 MED ORDER — ALBUTEROL SULFATE HFA 108 (90 BASE) MCG/ACT IN AERS: INHALATION_SPRAY | RESPIRATORY_TRACT | 2 refills | Status: DC

## 2020-03-26 NOTE — Telephone Encounter (Signed)
Rx has been sent to preferred pharmacy for pt. Called and spoke with pt letting her know this had been done and she verbalized understanding. Nothing further needed. °

## 2020-04-08 ENCOUNTER — Encounter: Payer: Self-pay | Admitting: Student in an Organized Health Care Education/Training Program

## 2020-04-08 ENCOUNTER — Other Ambulatory Visit: Payer: Self-pay

## 2020-04-08 ENCOUNTER — Ambulatory Visit
Payer: Medicare Other | Attending: Student in an Organized Health Care Education/Training Program | Admitting: Student in an Organized Health Care Education/Training Program

## 2020-04-08 VITALS — BP 122/86 | HR 84 | Temp 97.2°F | Resp 16 | Ht 64.0 in | Wt 190.4 lb

## 2020-04-08 DIAGNOSIS — G894 Chronic pain syndrome: Secondary | ICD-10-CM | POA: Insufficient documentation

## 2020-04-08 DIAGNOSIS — M47812 Spondylosis without myelopathy or radiculopathy, cervical region: Secondary | ICD-10-CM | POA: Diagnosis present

## 2020-04-08 DIAGNOSIS — Z981 Arthrodesis status: Secondary | ICD-10-CM | POA: Diagnosis present

## 2020-04-08 DIAGNOSIS — G8929 Other chronic pain: Secondary | ICD-10-CM | POA: Diagnosis present

## 2020-04-08 DIAGNOSIS — M25561 Pain in right knee: Secondary | ICD-10-CM | POA: Insufficient documentation

## 2020-04-08 DIAGNOSIS — Z96651 Presence of right artificial knee joint: Secondary | ICD-10-CM | POA: Insufficient documentation

## 2020-04-08 DIAGNOSIS — M9971 Connective tissue and disc stenosis of intervertebral foramina of cervical region: Secondary | ICD-10-CM | POA: Insufficient documentation

## 2020-04-08 DIAGNOSIS — M542 Cervicalgia: Secondary | ICD-10-CM | POA: Insufficient documentation

## 2020-04-08 DIAGNOSIS — M4802 Spinal stenosis, cervical region: Secondary | ICD-10-CM | POA: Insufficient documentation

## 2020-04-08 NOTE — Patient Instructions (Signed)
GENERAL RISKS AND COMPLICATIONS  What are the risk, side effects and possible complications? Generally speaking, most procedures are safe.  However, with any procedure there are risks, side effects, and the possibility of complications.  The risks and complications are dependent upon the sites that are lesioned, or the type of nerve block to be performed.  The closer the procedure is to the spine, the more serious the risks are.  Great care is taken when placing the radio frequency needles, block needles or lesioning probes, but sometimes complications can occur. 1. Infection: Any time there is an injection through the skin, there is a risk of infection.  This is why sterile conditions are used for these blocks.  There are four possible types of infection. 1. Localized skin infection. 2. Central Nervous System Infection-This can be in the form of Meningitis, which can be deadly. 3. Epidural Infections-This can be in the form of an epidural abscess, which can cause pressure inside of the spine, causing compression of the spinal cord with subsequent paralysis. This would require an emergency surgery to decompress, and there are no guarantees that the patient would recover from the paralysis. 4. Discitis-This is an infection of the intervertebral discs.  It occurs in about 1% of discography procedures.  It is difficult to treat and it may lead to surgery.        2. Pain: the needles have to go through skin and soft tissues, will cause soreness.       3. Damage to internal structures:  The nerves to be lesioned may be near blood vessels or    other nerves which can be potentially damaged.       4. Bleeding: Bleeding is more common if the patient is taking blood thinners such as  aspirin, Coumadin, Ticiid, Plavix, etc., or if he/she have some genetic predisposition  such as hemophilia. Bleeding into the spinal canal can cause compression of the spinal  cord with subsequent paralysis.  This would require an  emergency surgery to  decompress and there are no guarantees that the patient would recover from the  paralysis.       5. Pneumothorax:  Puncturing of a lung is a possibility, every time a needle is introduced in  the area of the chest or upper back.  Pneumothorax refers to free air around the  collapsed lung(s), inside of the thoracic cavity (chest cavity).  Another two possible  complications related to a similar event would include: Hemothorax and Chylothorax.   These are variations of the Pneumothorax, where instead of air around the collapsed  lung(s), you may have blood or chyle, respectively.       6. Spinal headaches: They may occur with any procedures in the area of the spine.       7. Persistent CSF (Cerebro-Spinal Fluid) leakage: This is a rare problem, but may occur  with prolonged intrathecal or epidural catheters either due to the formation of a fistulous  track or a dural tear.       8. Nerve damage: By working so close to the spinal cord, there is always a possibility of  nerve damage, which could be as serious as a permanent spinal cord injury with  paralysis.       9. Death:  Although rare, severe deadly allergic reactions known as "Anaphylactic  reaction" can occur to any of the medications used.      10. Worsening of the symptoms:  We can always make thing worse.    What are the chances of something like this happening? Chances of any of this occuring are extremely low.  By statistics, you have more of a chance of getting killed in a motor vehicle accident: while driving to the hospital than any of the above occurring .  Nevertheless, you should be aware that they are possibilities.  In general, it is similar to taking a shower.  Everybody knows that you can slip, hit your head and get killed.  Does that mean that you should not shower again?  Nevertheless always keep in mind that statistics do not mean anything if you happen to be on the wrong side of them.  Even if a procedure has a 1  (one) in a 1,000,000 (million) chance of going wrong, it you happen to be that one..Also, keep in mind that by statistics, you have more of a chance of having something go wrong when taking medications.  Who should not have this procedure? If you are on a blood thinning medication (e.g. Coumadin, Plavix, see list of "Blood Thinners"), or if you have an active infection going on, you should not have the procedure.  If you are taking any blood thinners, please inform your physician.  How should I prepare for this procedure?  Do not eat or drink anything at least six hours prior to the procedure.  Bring a driver with you .  It cannot be a taxi.  Come accompanied by an adult that can drive you back, and that is strong enough to help you if your legs get weak or numb from the local anesthetic.  Take all of your medicines the morning of the procedure with just enough water to swallow them.  If you have diabetes, make sure that you are scheduled to have your procedure done first thing in the morning, whenever possible.  If you have diabetes, take only half of your insulin dose and notify our nurse that you have done so as soon as you arrive at the clinic.  If you are diabetic, but only take blood sugar pills (oral hypoglycemic), then do not take them on the morning of your procedure.  You may take them after you have had the procedure.  Do not take aspirin or any aspirin-containing medications, at least eleven (11) days prior to the procedure.  They may prolong bleeding.  Wear loose fitting clothing that may be easy to take off and that you would not mind if it got stained with Betadine or blood.  Do not wear any jewelry or perfume  Remove any nail coloring.  It will interfere with some of our monitoring equipment.  NOTE: Remember that this is not meant to be interpreted as a complete list of all possible complications.  Unforeseen problems may occur.  BLOOD THINNERS The following drugs  contain aspirin or other products, which can cause increased bleeding during surgery and should not be taken for 2 weeks prior to and 1 week after surgery.  If you should need take something for relief of minor pain, you may take acetaminophen which is found in Tylenol,m Datril, Anacin-3 and Panadol. It is not blood thinner. The products listed below are.  Do not take any of the products listed below in addition to any listed on your instruction sheet.  A.P.C or A.P.C with Codeine Codeine Phosphate Capsules #3 Ibuprofen Ridaura  ABC compound Congesprin Imuran rimadil  Advil Cope Indocin Robaxisal  Alka-Seltzer Effervescent Pain Reliever and Antacid Coricidin or Coricidin-D  Indomethacin Rufen    Alka-Seltzer plus Cold Medicine Cosprin Ketoprofen S-A-C Tablets  Anacin Analgesic Tablets or Capsules Coumadin Korlgesic Salflex  Anacin Extra Strength Analgesic tablets or capsules CP-2 Tablets Lanoril Salicylate  Anaprox Cuprimine Capsules Levenox Salocol  Anexsia-D Dalteparin Magan Salsalate  Anodynos Darvon compound Magnesium Salicylate Sine-off  Ansaid Dasin Capsules Magsal Sodium Salicylate  Anturane Depen Capsules Marnal Soma  APF Arthritis pain formula Dewitt's Pills Measurin Stanback  Argesic Dia-Gesic Meclofenamic Sulfinpyrazone  Arthritis Bayer Timed Release Aspirin Diclofenac Meclomen Sulindac  Arthritis pain formula Anacin Dicumarol Medipren Supac  Analgesic (Safety coated) Arthralgen Diffunasal Mefanamic Suprofen  Arthritis Strength Bufferin Dihydrocodeine Mepro Compound Suprol  Arthropan liquid Dopirydamole Methcarbomol with Aspirin Synalgos  ASA tablets/Enseals Disalcid Micrainin Tagament  Ascriptin Doan's Midol Talwin  Ascriptin A/D Dolene Mobidin Tanderil  Ascriptin Extra Strength Dolobid Moblgesic Ticlid  Ascriptin with Codeine Doloprin or Doloprin with Codeine Momentum Tolectin  Asperbuf Duoprin Mono-gesic Trendar  Aspergum Duradyne Motrin or Motrin IB Triminicin  Aspirin  plain, buffered or enteric coated Durasal Myochrisine Trigesic  Aspirin Suppositories Easprin Nalfon Trillsate  Aspirin with Codeine Ecotrin Regular or Extra Strength Naprosyn Uracel  Atromid-S Efficin Naproxen Ursinus  Auranofin Capsules Elmiron Neocylate Vanquish  Axotal Emagrin Norgesic Verin  Azathioprine Empirin or Empirin with Codeine Normiflo Vitamin E  Azolid Emprazil Nuprin Voltaren  Bayer Aspirin plain, buffered or children's or timed BC Tablets or powders Encaprin Orgaran Warfarin Sodium  Buff-a-Comp Enoxaparin Orudis Zorpin  Buff-a-Comp with Codeine Equegesic Os-Cal-Gesic   Buffaprin Excedrin plain, buffered or Extra Strength Oxalid   Bufferin Arthritis Strength Feldene Oxphenbutazone   Bufferin plain or Extra Strength Feldene Capsules Oxycodone with Aspirin   Bufferin with Codeine Fenoprofen Fenoprofen Pabalate or Pabalate-SF   Buffets II Flogesic Panagesic   Buffinol plain or Extra Strength Florinal or Florinal with Codeine Panwarfarin   Buf-Tabs Flurbiprofen Penicillamine   Butalbital Compound Four-way cold tablets Penicillin   Butazolidin Fragmin Pepto-Bismol   Carbenicillin Geminisyn Percodan   Carna Arthritis Reliever Geopen Persantine   Carprofen Gold's salt Persistin   Chloramphenicol Goody's Phenylbutazone   Chloromycetin Haltrain Piroxlcam   Clmetidine heparin Plaquenil   Cllnoril Hyco-pap Ponstel   Clofibrate Hydroxy chloroquine Propoxyphen         Before stopping any of these medications, be sure to consult the physician who ordered them.  Some, such as Coumadin (Warfarin) are ordered to prevent or treat serious conditions such as "deep thrombosis", "pumonary embolisms", and other heart problems.  The amount of time that you may need off of the medication may also vary with the medication and the reason for which you were taking it.  If you are taking any of these medications, please make sure you notify your pain physician before you undergo any  procedures.         Moderate Conscious Sedation, Adult Sedation is the use of medicines to promote relaxation and relieve discomfort and anxiety. Moderate conscious sedation is a type of sedation. Under moderate conscious sedation, you are less alert than normal, but you are still able to respond to instructions, touch, or both. Moderate conscious sedation is used during short medical and dental procedures. It is milder than deep sedation, which is a type of sedation under which you cannot be easily woken up. It is also milder than general anesthesia, which is the use of medicines to make you unconscious. Moderate conscious sedation allows you to return to your regular activities sooner. Tell a health care provider about:  Any allergies you have.  All medicines you are  taking, including vitamins, herbs, eye drops, creams, and over-the-counter medicines.  Use of steroids (by mouth or creams).  Any problems you or family members have had with sedatives and anesthetic medicines.  Any blood disorders you have.  Any surgeries you have had.  Any medical conditions you have, such as sleep apnea.  Whether you are pregnant or may be pregnant.  Any use of cigarettes, alcohol, marijuana, or street drugs. What are the risks? Generally, this is a safe procedure. However, problems may occur, including:  Getting too much medicine (oversedation).  Nausea.  Allergic reaction to medicines.  Trouble breathing. If this happens, a breathing tube may be used to help with breathing. It will be removed when you are awake and breathing on your own.  Heart trouble.  Lung trouble. What happens before the procedure? Staying hydrated Follow instructions from your health care provider about hydration, which may include:  Up to 2 hours before the procedure - you may continue to drink clear liquids, such as water, clear fruit juice, black coffee, and plain tea. Eating and drinking  restrictions Follow instructions from your health care provider about eating and drinking, which may include:  8 hours before the procedure - stop eating heavy meals or foods such as meat, fried foods, or fatty foods.  6 hours before the procedure - stop eating light meals or foods, such as toast or cereal.  6 hours before the procedure - stop drinking milk or drinks that contain milk.  2 hours before the procedure - stop drinking clear liquids. Medicine Ask your health care provider about:  Changing or stopping your regular medicines. This is especially important if you are taking diabetes medicines or blood thinners.  Taking medicines such as aspirin and ibuprofen. These medicines can thin your blood. Do not take these medicines before your procedure if your health care provider instructs you not to.  Tests and exams  You will have a physical exam.  You may have blood tests done to show: ? How well your kidneys and liver are working. ? How well your blood can clot. General instructions  Plan to have someone take you home from the hospital or clinic.  If you will be going home right after the procedure, plan to have someone with you for 24 hours. What happens during the procedure?  An IV tube will be inserted into one of your veins.  Medicine to help you relax (sedative) will be given through the IV tube.  The medical or dental procedure will be performed. What happens after the procedure?  Your blood pressure, heart rate, breathing rate, and blood oxygen level will be monitored often until the medicines you were given have worn off.  Do not drive for 24 hours. This information is not intended to replace advice given to you by your health care provider. Make sure you discuss any questions you have with your health care provider. Document Revised: 06/24/2017 Document Reviewed: 11/01/2015 Elsevier Patient Education  [place]. Facet Blocks Patient  Information  Description: The facets are joints in the spine between the vertebrae.  Like any joints in the body, facets can become irritated and painful.  Arthritis can also effect the facets.  By injecting steroids and local anesthetic in and around these joints, we can temporarily block the nerve supply to them.  Steroids act directly on irritated nerves and tissues to reduce selling and inflammation which often leads to decreased pain.  Facet blocks may be done anywhere along  the spine from the neck to the low back depending upon the location of your pain.   After numbing the skin with local anesthetic (like Novocaine), a small needle is passed onto the facet joints under x-ray guidance.  You may experience a sensation of pressure while this is being done.  The entire block usually lasts about 15-25 minutes.   Conditions which may be treated by facet blocks:   Low back/buttock pain  Neck/shoulder pain  Certain types of headaches  Preparation for the injection:  2. Do not eat any solid food or dairy products within 8 hours of your appointment. 3. You may drink clear liquid up to 3 hours before appointment.  Clear liquids include water, black coffee, juice or soda.  No milk or cream please. 4. You may take your regular medication, including pain medications, with a sip of water before your appointment.  Diabetics should hold regular insulin (if taken separately) and take 1/2 normal NPH dose the morning of the procedure.  Carry some sugar containing items with you to your appointment. 5. A driver must accompany you and be prepared to drive you home after your procedure. 6. Bring all your current medications with you. 7. An IV may be inserted and sedation may be given at the discretion of the physician. 8. A blood pressure cuff, EKG and other monitors will often be applied during the procedure.  Some patients may need to have extra oxygen administered for a short period. 9. You will be asked  to provide medical information, including your allergies and medications, prior to the procedure.  We must know immediately if you are taking blood thinners (like Coumadin/Warfarin) or if you are allergic to IV iodine contrast (dye).  We must know if you could possible be pregnant.  Possible side-effects:   Bleeding from needle site  Infection (rare, may require surgery)  Nerve injury (rare)  Numbness & tingling (temporary)  Difficulty urinating (rare, temporary)  Spinal headache (a headache worse with upright posture)  Light-headedness (temporary)  Pain at injection site (serveral days)  Decreased blood pressure (rare, temporary)  Weakness in arm/leg (temporary)  Pressure sensation in back/neck (temporary)   Call if you experience:   Fever/chills associated with headache or increased back/neck pain  Headache worsened by an upright position  New onset, weakness or numbness of an extremity below the injection site  Hives or difficulty breathing (go to the emergency room)  Inflammation or drainage at the injection site(s)  Severe back/neck pain greater than usual  New symptoms which are concerning to you  Please note:  Although the local anesthetic injected can often make your back or neck feel good for several hours after the injection, the pain will likely return. It takes 3-7 days for steroids to work.  You may not notice any pain relief for at least one week.  If effective, we will often do a series of 2-3 injections spaced 3-6 weeks apart to maximally decrease your pain.  After the initial series, you may be a candidate for a more permanent nerve block of the facets.  If you have any questions, please call #[phone]) [facility]

## 2020-04-08 NOTE — Progress Notes (Signed)
Patient: Kathryn Ramirez  Service Category: E/M  Provider: Gillis Santa, MD  DOB: [birth year]  DOS: 04/08/2020  Referring Provider: Padovano Askew, MD  MRN: [id number]  Setting: Ambulatory outpatient  PCP: Gale Journey, MD  Type: New Patient  Specialty: Interventional Pain Management    Location: Office  Delivery: Face-to-face     Primary Reason(s) for Visit: Encounter for initial evaluation of one or more chronic problems (new to examiner) potentially causing chronic pain, and posing a threat to normal musculoskeletal function. (Level of risk: High) CC: Neck Pain and Knee Pain (right)  HPI  Kathryn Ramirez is a 69 y.o. year old, female patient, who comes for the first time to our practice referred by Girtha Hake I, MD for our initial evaluation of her chronic pain. She has Bipolar disorder with moderate depression ([place]); Chronic pain; Baker's cyst of knee, right; Multiple pulmonary nodules; Asthma; OAB (overactive bladder); Pulmonary scarring; Pulmonary infiltrates; Chronic cough; Normal blood pressure; Impairment of balance; Acute right-sided low back pain without sciatica; Chest pain; Vocal cord polyp; GERD (gastroesophageal reflux disease); History of hypothyroidism; Neck swelling; Acute asthma exacerbation; Arthritis; Carpal tunnel syndrome of left wrist; Cervical myelopathy with cervical radiculopathy; Chronic RUQ pain; Drug-induced peripheral neuropathy ([place]); Dry mouth; Elevated antinuclear antibody (ANA) level; Esophageal dysphagia; Arthropathy of cervical facet joint; Generalized osteoarthritis of hand; Hemangioma of liver; Hep C w/o coma, chronic ([place]); Hyperlipidemia, unspecified; Hypertension; Hyponatremia; Degenerative joint disease of right knee; Cervicalgia; Cervical spondylosis without myelopathy; Sensory ataxia; 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 Today she comes in for evaluation of her Neck Pain and Knee Pain (right)  Pain Assessment: Location: Posterior Neck Radiating: denies Onset: More than a month ago Duration: Chronic pain Quality: Aching Severity: 5 /10 (subjective, self-reported pain score)  Effect on ADL: limits daily activities - required to rest often Timing: Intermittent Modifying factors: Tylenol takes edge off BP: 122/86  HR: 84  Onset and Duration: Gradual Cause of pain: Unknown Severity: Getting worse, NAS-11 at its worse: 8/10, NAS-11 at its best: 5/10, NAS-11 now: 8/10 and NAS-11 on the average: 5/10 Timing: Not influenced by the time of the day, During activity or exercise, After activity or exercise and After a period of immobility Aggravating Factors: Bending, Lifiting and Twisting Alleviating Factors: Hot packs, Lying down, Medications, Nerve blocks, Resting, Sleeping and Warm showers or baths Associated Problems: Depression, Dizziness, Inability to concentrate, Tingling and Pain that wakes patient up Quality of Pain: Aching, Annoying, Burning, Intermittent, Dull, Heavy, Shooting, Tender, Throbbing, Tingling and Uncomfortable Previous Examinations or Tests: MRI scan, Nerve block, X-rays and Neurosurgical evaluation Previous Treatments: Epidural steroid injections and Facet blocks  Kathryn Ramirez is a pleasant 69 year old female who presents with a chief complaint of neck pain.  Patient has a history of C5-C7 ACDF.  This was done over 8 years ago.  She has pain with cervical extension and lateral rotation.  Of note she had a cervical epidural steroid injection done in 2017 which was beneficial for her symptoms at that time.  She states that she was having more  upper extremity symptoms.  She did have a vasovagal response during that procedure.  She is being referred from Dr. Alba Destine to  consider cervical epidural steroid injection.  Of note patient also has a history of right knee replacement.  She does have right knee pain with weightbearing.  She has tried various over-the-counter medications which only slightly effective.  We discussed considering right genicular nerve block in the future and possible RFA.  We will address neck pain first.  Of note, patient does have a history of bipolar disorder, is followed by [facility] psychiatry.  Patient also has a history of alcohol use disorder that is currently in remission.  It is stable.  She has attended [place] meetings in the past but is not currently attending.  Last use of alcohol was 6 years ago.  I informed the patient that we will focus primarily on interventional pain management.  Meds   Current Outpatient Medications:  .  acetaminophen (TYLENOL) 500 MG tablet, Take 500 mg by mouth 2 (two) times daily., Disp: , Rfl:  .  albuterol (VENTOLIN HFA) 108 (90 Base) MCG/ACT inhaler, INHALE 2 PUFFS EVERY 4 HOURS AS NEEDED FOR WHEEZING FOR SHORTNESS OF BREATH, Disp: 54 g, Rfl: 2 .  alendronate (FOSAMAX) 70 MG tablet, Take 1 tablet (70 mg total) by mouth every 7 (seven) days. Take with a full glass of water on an empty stomach. (Patient taking differently: Take 70 mg by mouth every 7 (seven) days. Take with a full glass of water on an empty stomach. Either on Saturday or Sunday), Disp: 4 tablet, Rfl: 11 .  ARIPiprazole (ABILIFY) 5 MG tablet, Take 7.5 mg by mouth daily. , Disp: , Rfl:  .  buPROPion (WELLBUTRIN XL) 150 MG 24 hr tablet, Take 150 mg by mouth daily., Disp: , Rfl:  .  calcipotriene (DOVONOX) 0.005 % ointment, Apply topically 2 (two) times daily. (Patient taking differently: Apply 1 application topically 2 (two) times daily as needed (psoriasis). ), Disp: 60 g, Rfl: 0 .  diazepam (VALIUM) 2 MG tablet, Take 1 mg by  mouth at bedtime. , Disp: , Rfl:  .  ferrous sulfate 325 (65 FE) MG tablet, Take 325 mg by mouth daily with breakfast., Disp: , Rfl:  .  gabapentin (NEURONTIN) 600 MG tablet, Take 300 mg by mouth 3 (three) times daily. , Disp: , Rfl:  .  melatonin 5 MG TABS, Take 5 mg by mouth at bedtime., Disp: , Rfl:  .  oxybutynin (DITROPAN) 5 MG tablet, Take 5 mg by mouth every morning. , Disp: , Rfl:  .  pantoprazole (PROTONIX) 40 MG tablet, TAKE 1 TABLET (40 MG TOTAL) BY MOUTH EVERY EVENING., Disp: 90 tablet, Rfl: 3 .  rosuvastatin (CRESTOR) 10 MG tablet, Take 1 tablet (10 mg total) by mouth at bedtime., Disp: 90 tablet, Rfl: 0 .  senna (SENOKOT) 8.6 MG TABS tablet, Take 1 tablet (8.6 mg total) by mouth 2 (two) times daily. (Patient taking differently: Take 1 tablet by mouth daily as needed for mild constipation. ), Disp: 30 each, Rfl: 0 .  STIOLTO RESPIMAT 2.5-2.5 MCG/ACT AERS, INHALE 2 PUFFS INTO LUNGS EVERY DAY (Patient taking differently: Inhale 2 puffs into the lungs daily. ), Disp: 12 g, Rfl: 2 .  SYNJARDY XR 25-1000 MG TB24, Take 1 tablet by mouth daily. , Disp: , Rfl:  .  traZODone (DESYREL) 150 MG tablet, Take 150 mg by mouth at bedtime. , Disp: , Rfl:  .  HYDROcodone-acetaminophen (NORCO) 5-325 MG tablet, Take 1-2 tablets by mouth every 6 (six) hours as needed for moderate pain or severe pain. MAXIMUM TOTAL ACETAMINOPHEN DOSE IS 4000  MG PER DAY (Patient not taking: Reported on 04/08/2020), Disp: 15 tablet, Rfl: 0 .  ondansetron (ZOFRAN ODT) 4 MG disintegrating tablet, Take 1 tablet (4 mg total) by mouth every 8 (eight) hours as needed for nausea or vomiting. (Patient not taking: Reported on 04/08/2020), Disp: 20 tablet, Rfl: 1  Imaging Review  Cervical Imaging: Cervical MR wo contrast: Results for orders placed during the hospital encounter of 12/20/18  MR CERVICAL SPINE WO CONTRAST  Narrative CLINICAL DATA:  Neck pain for 1 year  EXAM: MRI CERVICAL SPINE WITHOUT  CONTRAST  TECHNIQUE: Multiplanar, multisequence MR imaging of the cervical spine was performed. No intravenous contrast was administered.  COMPARISON:  None.  FINDINGS: Alignment: 2-3 mm of degenerative anterolisthesis at C3-4.  Vertebrae: No fracture, evidence of discitis, or bone lesion.  Cord: Normal signal and morphology.  Posterior Fossa, vertebral arteries, paraspinal tissues: Negative  Disc levels:  C1-2: Advanced facet degeneration with asymmetric left-sided spurring and height loss. There could be C2 impingement on the left.  C2-3: Degenerative facet spurring asymmetric to the right. Patent canal and foramina.  C3-4: Facet degeneration with bulky left-sided spurring and anterolisthesis. Mild disc narrowing and endplate ridging. Moderate left foraminal narrowing.  C4-5: Disc narrowing and bulge with a left paracentral protrusion. There is bilateral facet degeneration with bulky spurring on the left. Moderate bilateral foraminal narrowing. Mild spinal stenosis without cord compression  C5-6: ACDF.  No impingement  C6-7: ACDF. There is a chronic downward pointing residual disc protrusion contacting the ventral cord. Mild-to-moderate left foraminal narrowing  C7-T1:Facet degeneration with spurring.  No impingement  IMPRESSION: 1. Advanced facet arthropathy at C1-2 to C4-5, with C3-4 anterolisthesis. 2. Moderate foraminal narrowing on the left at C3-4, C4-5 and right at C4-5. 3. C5-6 and C6-7 ACDF.  The spinal canal is diffusely patent.   Electronically Signed By: Monte Fantasia M.D. On: 12/21/2018 03:31 DG Knee Complete 4 Views Right  Narrative CLINICAL DATA:  Right knee pain. Right total knee arthroplasty in December 2018.  EXAM: RIGHT KNEE - COMPLETE 4+ VIEW  COMPARISON:  Radiographs 06/15/2017.  FINDINGS: Status post right total knee arthroplasty. The hardware is intact without loosening. No evidence of acute fracture or  dislocation. Fullness of the anterior soft tissues on the lateral view, suggesting the presence of a moderate-sized joint effusion and possible synovitis in Hoffa's fat.  IMPRESSION: 1. No acute osseous findings or hardware loosening. 2. Anterior soft tissue thickening consistent with a joint effusion and possible synovitis in Hoffa's fat.   Electronically Signed By: Richardean Sale M.D. On: 12/28/2017 12:08    DG Foot Complete Right  Narrative CLINICAL DATA:  Injury, dorsal to lateral pain  EXAM: RIGHT FOOT COMPLETE - 3+ VIEW  COMPARISON:  None.  FINDINGS: No fracture or dislocation of the right foot. Mild first metatarsophalangeal arthrosis. Joint spaces are otherwise well preserved. Small plantar calcaneal spur. Soft tissues are unremarkable.  IMPRESSION: No fracture or dislocation of the right foot.   Electronically Signed By: Eddie Candle M.D. On: 04/04/2019 15:30   Complexity Note: Imaging results reviewed. Results shared with Ms. Vertell Limber, using Layman's terms.                         ROS  Cardiovascular: High blood pressure, Chest pain, Heart murmur and Needs antibiotics prior to dental procedures Pulmonary or Respiratory: Difficulty blowing air out (Emphysema) Neurological: Curved spine and Incontinence:  Urinary Psychological-Psychiatric: Psychiatric disorder, Anxiousness, Depressed, Prone to panicking,  Suicidal ideations, Attempted suicide, History of abuse and Difficulty sleeping and or falling asleep Gastrointestinal: Reflux or heatburn, Inflamed liver (Hepatitis) and Irregular, infrequent bowel movements (Constipation) Genitourinary: No reported renal or genitourinary signs or symptoms such as difficulty voiding or producing urine, peeing blood, non-functioning kidney, kidney stones, difficulty emptying the bladder, difficulty controlling the flow of urine, or chronic kidney disease Hematological: Weakness due to low blood hemoglobin or red blood cell  count (Anemia), Brusing easily and Bleeding easily Endocrine: High blood sugar requiring insulin (IDDM) Rheumatologic: Joint aches and or swelling due to excess weight (Osteoarthritis) Musculoskeletal: Negative for myasthenia gravis, muscular dystrophy, multiple sclerosis or malignant hyperthermia Work History: Retired  Allergies  Ms. Tippins is allergic to codeine, haldol [haloperidol], haloperidol lactate, meloxicam, and trileptal [oxcarbazepine].  Laboratory Chemistry Profile   Renal Lab Results  Component Value Date   BUN 15 02/19/2020   CREATININE 0.80 02/19/2020   LABCREA 56 36/14/4315   BCR NOT APPLICABLE 40/02/6760   GFRAA >60 02/19/2020   GFRNONAA >60 02/19/2020   SPECGRAV 1.009 08/19/2015   PHUR 6.5 08/19/2015   PROTEINUR NEGATIVE 06/01/2017     Electrolytes Lab Results  Component Value Date   NA 134 (L) 02/19/2020   K 3.7 02/19/2020   CL 102 02/19/2020   CALCIUM 8.8 (L) 02/19/2020     Hepatic Lab Results  Component Value Date   AST 18 02/19/2020   ALT 14 02/19/2020   ALBUMIN 4.1 02/19/2020   ALKPHOS 50 02/19/2020     ID Lab Results  Component Value Date   SARSCOV2NAA NEGATIVE 02/19/2020   STAPHAUREUS POSITIVE (A) 06/01/2017   MRSAPCR NEGATIVE 06/01/2017     Bone Lab Results  Component Value Date   VD25OH 30 12/27/2016     Endocrine Lab Results  Component Value Date   GLUCOSE 128 (H) 02/19/2020   GLUCOSEU NEGATIVE 06/01/2017   HGBA1C 6.6 (H) 06/01/2017   TSH 1.50 12/27/2016     Neuropathy Lab Results  Component Value Date   HGBA1C 6.6 (H) 06/01/2017     CNS No results found for: COLORCSF, APPEARCSF, RBCCOUNTCSF, WBCCSF, POLYSCSF, LYMPHSCSF, EOSCSF, PROTEINCSF, GLUCCSF, JCVIRUS, CSFOLI, IGGCSF, LABACHR, ACETBL, LABACHR, ACETBL   Inflammation (CRP: Acute  ESR: Chronic) Lab Results  Component Value Date   ESRSEDRATE 11 07/31/2015     Rheumatology No results found for: RF, ANA, LABURIC, URICUR, LYMEIGGIGMAB, LYMEABIGMQN, HLAB27    Coagulation Lab Results  Component Value Date   INR 1.1 02/19/2020   LABPROT 13.5 02/19/2020   APTT 29 06/01/2017   PLT 248 02/19/2020     Cardiovascular Lab Results  Component Value Date   HGB 11.7 (L) 02/19/2020   HCT 37.9 02/19/2020     Screening Lab Results  Component Value Date   SARSCOV2NAA NEGATIVE 02/19/2020   STAPHAUREUS POSITIVE (A) 06/01/2017   MRSAPCR NEGATIVE 06/01/2017     Cancer No results found for: CEA, CA125, LABCA2   Allergens No results found for: ALMOND, APPLE, ASPARAGUS, AVOCADO, BANANA, BARLEY, BASIL, BAYLEAF, GREENBEAN, LIMABEAN, WHITEBEAN, BEEFIGE, REDBEET, BLUEBERRY, BROCCOLI, CABBAGE, MELON, CARROT, CASEIN, CASHEWNUT, CAULIFLOWER, CELERY     Note: Lab results reviewed.  [place]  Drug: Ms. Dykes  reports no history of drug use. Alcohol:  reports previous alcohol use. Tobacco:  reports that she quit smoking about 4 years ago. Her smoking use included e-cigarettes. She has a 30.00 pack-year smoking history. She has never used smokeless tobacco. Medical:  has a past medical history of Acid reflux disease, Anemia, Asthma, Bipolar 1 disorder (  [place]), COPD (chronic obstructive pulmonary disease) ([place]), Diabetes mellitus without complication ([place]), History of hepatitis C (2010), Hypertension, Liver hemangioma, Lung nodule, Psoriasis, Rheumatoid arthritis ([place]), Tardive dyskinesia, and Wears dentures. Family: family history includes Asthma in her brother; Cancer in her mother.  Past Surgical History:  Procedure Laterality Date  . CARPAL TUNNEL RELEASE Left 02/21/2020   Procedure: CARPAL TUNNEL RELEASE ENDOSCOPIC;  Surgeon: Corky Mull, MD;  Location: [facility] ORS;  Service: Orthopedics;  Laterality: Left;  . CATARACT EXTRACTION W/ INTRAOCULAR LENS  IMPLANT, BILATERAL    . CERVICAL SPINE SURGERY  2017   [facility]  . COLONOSCOPY WITH PROPOFOL N/A 09/14/2016   Procedure: COLONOSCOPY WITH PROPOFOL;  Surgeon: Lucilla Lame, MD;  Location: [facility];   Service: Endoscopy;  Laterality: N/A;  . ESOPHAGOGASTRODUODENOSCOPY (EGD) WITH PROPOFOL N/A 09/14/2016   Procedure: ESOPHAGOGASTRODUODENOSCOPY (EGD) WITH PROPOFOL;  Surgeon: Lucilla Lame, MD;  Location: [facility] END[facility];  Service: Endoscopy;  Laterality: N/A;  . FLEXIBLE BRONCHOSCOPY N/A 01/13/2016   Procedure: FLEXIBLE BRONCHOSCOPY;  Surgeon: Vilinda Boehringer, MD;  Location: [facility] ORS;  Service: Cardiopulmonary;  Laterality: N/A;  . JOINT REPLACEMENT    . NEUROPLASTY MAJOR NERVE    . POLYPECTOMY    . THROAT SURGERY    . TOTAL KNEE ARTHROPLASTY Right 06/15/2017   Procedure: TOTAL KNEE ARTHROPLASTY;  Surgeon: Earnestine Leys, MD;  Location: [facility] OR[facility];  Service: Orthopedics;  Laterality: Right;   Active Ambulatory Problems    Diagnosis Date Noted  . Bipolar disorder with moderate depression ([place]) 12/19/2014  . Chronic pain 12/19/2014  . Baker's cyst of knee, right 07/02/2015  . Multiple pulmonary nodules 09/15/2015  . Asthma 09/18/2015  . OAB (overactive bladder) 12/18/2015  . Pulmonary scarring 01/06/2016  . Pulmonary infiltrates   . Chronic cough   . Normal blood pressure 01/23/2016  . Impairment of balance 02/05/2016  . Acute right-sided low back pain without sciatica 02/05/2016  . Chest pain 04/06/2016  . 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 GERD (gastroesophageal reflux disease) 08/09/2016  . History of hypothyroidism 08/19/2016  . Neck swelling 08/19/2016  . Acute asthma exacerbation 08/19/2016  . Arthritis 09/02/2016  . Carpal tunnel syndrome of left wrist 10/01/2014  . Cervical myelopathy with cervical radiculopathy 03/24/2014  . Chronic RUQ pain 09/19/2014  . Drug-induced peripheral neuropathy ([place]) 03/25/2016  . Dry mouth 06/01/2016  . Elevated antinuclear antibody (ANA) level 06/01/2016  . Esophageal dysphagia 09/19/2014  . Arthropathy of cervical facet joint 08/06/2014  . Generalized osteoarthritis of hand 06/01/2016  . Hemangioma of liver 09/19/2014  . Hep C w/o coma, chronic  ([place]) 09/02/2016  . Hyperlipidemia, unspecified 09/02/2016  . Hypertension 09/02/2016  . Hyponatremia 02/21/2014  . Degenerative joint disease of right knee 06/01/2016  . Cervicalgia 09/18/2013  . Cervical spondylosis without myelopathy 07/12/2014  . Sensory ataxia 03/25/2016  . 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 Tardive dyskinesia 09/02/2016  . Urinary incontinence 09/02/2016  . Wrist pain, chronic, left 03/25/2016  . Heartburn   . Gastritis without bleeding   . Hx of colonic polyps   . Erroneous encounter - disregard 03/29/2017  . 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 Pain in limb 01/27/2018  . S/P cervical spinal fusion (C5/6 and C6/7) 04/08/2020  . Neuroforaminal stenosis of cervical spine (moderate, Left C3/4, C4/5; R C4/5) 04/08/2020   Resolved Ambulatory Problems    Diagnosis Date Noted  . Knee pain, right 07/02/2015  . Community acquired pneumonia 08/14/2015  . Abnormal urinalysis  08/14/2015  . Breast asymmetry in female 09/15/2015   Past Medical History:  Diagnosis Date  . Acid reflux disease   . Anemia   . Bipolar 1 disorder ([place])   . COPD (chronic obstructive pulmonary disease) ([place])   . Diabetes mellitus without complication ([place])   . History of hepatitis C 2010  . Liver hemangioma   . Lung nodule   . Psoriasis   . Rheumatoid arthritis ([place])   . Wears dentures    Constitutional Exam  General appearance: Well nourished, well developed, and well hydrated. In no apparent acute distress Vitals:   04/08/20 0946  BP: 122/86  Pulse: 84  Resp: 16  Temp: (!) 97.2 F (36.2 C)  TempSrc: Temporal  SpO2: 95%  Weight: 190 lb 6.4 oz (86.4 kg)  Height: _0  (1.626 m)   BMI Assessment: Estimated body mass index is 32.68 kg/m as calculated from the following:   Height as of this encounter: _1  (1.626 m).   Weight as of this encounter: 190 lb 6.4 oz (86.4 kg).  BMI interpretation table: BMI level Category  Range association with higher incidence of chronic pain  <18 kg/m2 Underweight   18.5-24.9 kg/m2 Ideal body weight   25-29.9 kg/m2 Overweight Increased incidence by 20%  30-34.9 kg/m2 Obese (Class I) Increased incidence by 68%  35-39.9 kg/m2 Severe obesity (Class II) Increased incidence by 136%  >40 kg/m2 Extreme obesity (Class III) Increased incidence by 254%   Patient's current BMI Ideal Body weight  Body mass index is 32.68 kg/m. Ideal body weight: 54.7 kg (120 lb 9.5 oz) Adjusted ideal body weight: 67.4 kg (148 lb 8.2 oz)   BMI Readings from Last 4 Encounters:  04/08/20 32.68 kg/m  02/21/20 32.61 kg/m  02/19/20 32.61 kg/m  10/31/19 32.44 kg/m   Wt Readings from Last 4 Encounters:  04/08/20 190 lb 6.4 oz (86.4 kg)  02/21/20 190 lb (86.2 kg)  02/19/20 190 lb (86.2 kg)  10/31/19 189 lb (85.7 kg)    Psych/Mental status: Alert, oriented x 3 (person, place, & time)       Eyes: PERLA Respiratory: No evidence of acute respiratory distress  Cervical Spine Exam  Skin & Axial Inspection: Well healed scar from previous spine surgery detected Alignment: Symmetrical Functional ROM: Pain restricted ROM, bilaterally Stability: No instability detected Muscle Tone/Strength: Functionally intact. No obvious neuro-muscular anomalies detected. Sensory (Neurological): Neurogenic pain pattern and musculoskeletal Palpation: No palpable anomalies              Upper Extremity (UE) Exam    Side: Right upper extremity  Side: Left upper extremity  Skin & Extremity Inspection: Skin color, temperature, and hair growth are WNL. No peripheral edema or cyanosis. No masses, redness, swelling, asymmetry, or associated skin lesions. No contractures.  Skin & Extremity Inspection: Skin color, temperature, and hair growth are WNL. No peripheral edema or cyanosis. No masses, redness, swelling, asymmetry, or associated skin lesions. No contractures.  Functional ROM: Unrestricted ROM          Functional ROM:  Unrestricted ROM          Muscle Tone/Strength: Functionally intact. No obvious neuro-muscular anomalies detected.   Muscle Tone/Strength: Functionally intact. No obvious neuro-muscular anomalies detected.  Sensory (Neurological): Unimpaired          Sensory (Neurological): Unimpaired          Palpation: No palpable anomalies              Palpation: No palpable anomalies  Provocative Test(s):  Phalen's test: deferred Tinel's test: deferred Apley's scratch test (touch opposite shoulder):  Action 1 (Across chest): deferred Action 2 (Overhead): deferred Action 3 (LB reach): deferred   Provocative Test(s):  Phalen's test: deferred Tinel's test: deferred Apley's scratch test (touch opposite shoulder):  Action 1 (Across chest): deferred Action 2 (Overhead): deferred Action 3 (LB reach): deferred    Thoracic Spine Area Exam  Skin & Axial Inspection: No masses, redness, or swelling Alignment: Symmetrical Functional ROM: Unrestricted ROM Stability: No instability detected Muscle Tone/Strength: Functionally intact. No obvious neuro-muscular anomalies detected. Sensory (Neurological): Unimpaired Muscle strength & Tone: No palpable anomalies  Lumbar Exam  Skin & Axial Inspection: No masses, redness, or swelling Alignment: Symmetrical Functional ROM: Unrestricted ROM       Stability: No instability detected Muscle Tone/Strength: Functionally intact. No obvious neuro-muscular anomalies detected. Sensory (Neurological): Unimpaired Palpation: No palpable anomalies       Provocative Tests: Hyperextension/rotation test: deferred today       Lumbar quadrant test (Kemp's test): deferred today       Lateral bending test: deferred today       Patrick's Maneuver: deferred today                   FABER* test: deferred today                   S-I anterior distraction/compression test: deferred today         S-I lateral compression test: deferred today         S-I Thigh-thrust test:  deferred today         S-I Gaenslen's test: deferred today         *(Flexion, ABduction and External Rotation)  Gait & Posture Assessment  Ambulation: Unassisted Gait: Relatively normal for age and body habitus Posture: WNL   Lower Extremity Exam    Side: Right lower extremity  Side: Left lower extremity  Stability: No instability observed          Stability: No instability observed          Skin & Extremity Inspection: Evidence of prior arthroplastic surgery  Skin & Extremity Inspection: Skin color, temperature, and hair growth are WNL. No peripheral edema or cyanosis. No masses, redness, swelling, asymmetry, or associated skin lesions. No contractures.  Functional ROM: Pain restricted ROM                  Functional ROM: Unrestricted ROM                  Muscle Tone/Strength: Functionally intact. No obvious neuro-muscular anomalies detected.  Muscle Tone/Strength: Functionally intact. No obvious neuro-muscular anomalies detected.  Sensory (Neurological): Arthropathic arthralgia        Sensory (Neurological): Unimpaired        DTR: Patellar: deferred today Achilles: deferred today Plantar: deferred today  DTR: Patellar: deferred today Achilles: deferred today Plantar: deferred today  Palpation: No palpable anomalies  Palpation: No palpable anomalies   Assessment  Primary Diagnosis & Pertinent Problem List: The primary encounter diagnosis was Cervical facet joint syndrome (C2-C5). 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  Visit Diagnosis (New problems to examiner): 1. Cervical facet joint syndrome (C2-C5)  2. Arthropathy of cervical facet joint   3. Cervical spondylosis  without myelopathy   4. Cervicalgia   5. S/P cervical spinal fusion (C5/6 and C6/7)   6. 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 History of total right knee replacement   8. Chronic pain of right knee   9. Chronic pain syndrome    Patient's cervical spine MRI shows advanced cervical facet arthropathy, spondylosis from C1-C2 to C4-C5 with C3-C4 anterolisthesis.  The patient is primarily having neck pain and occipital pain.  Differential include cervical facet joint syndrome, occipital neuralgia.  Given that the patient is not endorsing any shoulder pain or upper extremity pain, would not start with cervical epidural steroid injection given lack of radicular symptoms.  Her pain symptoms have a referral pattern that is similar to cervical facet joint syndrome.  Joei D Schadt has a history of greater than 3 months of moderate to severe pain which is resulted in functional impairment.  The patient has tried various conservative therapeutic options such as NSAIDs, Tylenol, muscle relaxants, physical therapy which was inadequately effective.  Patient's pain is predominantly axial with physical exam findings suggestive of facet arthropathy.  Cervical facet medial branch nerve blocks were discussed with the patient.  Risks and benefits were reviewed.  Patient would like to proceed with bilateral C2, C3, C4, C5, medial branch nerve block.  This will be done under IV sedation given the patient's vasovagal response with previous cervical ESI.  In regards to her right knee pain, history of right knee replacement, discussed right knee genicular nerve block and possible radiofrequency ablation.  We will focus primarily on interventional pain management.  Continue psychiatric care.   Plan of Care (Initial workup plan)   Procedure Orders     CERVICAL FACET (MEDIAL BRANCH NERVE BLOCK)    Interventional management options: Ms. Warwick was informed that there is no  guarantee that she would be a candidate for interventional therapies. The decision will be based on the results of diagnostic studies, as well as Ms. Alvarenga's risk profile.  Procedure(s) under consideration:  Bilateral C2, C3, C4, C5 cervical facet medial branch nerve block Cervical facet radiofrequency ablation Right knee genicular nerve block Right genicular nerve RFA.   Provider-requested follow-up: Return in about 2 weeks (around 04/22/2020) for Bilateral C2, C3, C4, C5 Fct Block , with sedation.  Future Appointments  Date Time Provider [place]  04/21/2020 11:20 AM Gillis Santa, MD [facility] None    Note by: Gillis Santa, MD Date: 04/08/2020; Time: 11:21 AM

## 2020-04-08 NOTE — Progress Notes (Signed)
Safety precautions to be maintained throughout the outpatient stay will include: orient to surroundings, keep bed in low position, maintain call bell within reach at all times, provide assistance with transfer out of bed and ambulation.  

## 2020-04-21 ENCOUNTER — Encounter: Payer: Self-pay | Admitting: Student in an Organized Health Care Education/Training Program

## 2020-04-21 ENCOUNTER — Ambulatory Visit (HOSPITAL_BASED_OUTPATIENT_CLINIC_OR_DEPARTMENT_OTHER): Payer: Medicare Other | Admitting: Student in an Organized Health Care Education/Training Program

## 2020-04-21 ENCOUNTER — Other Ambulatory Visit: Payer: Self-pay

## 2020-04-21 ENCOUNTER — Ambulatory Visit
Admission: RE | Admit: 2020-04-21 | Discharge: 2020-04-21 | Disposition: A | Discharge status: Home / Self Care | Payer: Medicare Other | Source: Ambulatory Visit | Attending: Student in an Organized Health Care Education/Training Program | Admitting: Student in an Organized Health Care Education/Training Program

## 2020-04-21 VITALS — BP 135/64 | HR 78 | Temp 98.6°F | Resp 14 | Ht 64.0 in | Wt 185.0 lb

## 2020-04-21 DIAGNOSIS — M47812 Spondylosis without myelopathy or radiculopathy, cervical region: Secondary | ICD-10-CM | POA: Insufficient documentation

## 2020-04-21 DIAGNOSIS — M47892 Other spondylosis, cervical region: Secondary | ICD-10-CM | POA: Diagnosis not present

## 2020-04-21 MED ORDER — MIDAZOLAM HCL 5 MG/5ML IJ SOLN
1.0000 mg | INTRAMUSCULAR | Status: DC | PRN
  Administered 2020-04-21: 0.5 mg via INTRAVENOUS

## 2020-04-21 MED ORDER — ROPIVACAINE HCL 2 MG/ML IJ SOLN
9.0000 mL | Freq: Once | INTRAMUSCULAR | Status: AC
  Administered 2020-04-21: 9 mL via PERINEURAL

## 2020-04-21 MED ORDER — DEXAMETHASONE SODIUM PHOSPHATE 10 MG/ML IJ SOLN
INTRAMUSCULAR | Status: AC
  Filled 2020-04-21: qty 1

## 2020-04-21 MED ORDER — LIDOCAINE HCL 2 % IJ SOLN
20.0000 mL | Freq: Once | INTRAMUSCULAR | Status: AC
  Administered 2020-04-21: 400 mg

## 2020-04-21 MED ORDER — FENTANYL CITRATE (PF) 100 MCG/2ML IJ SOLN
INTRAMUSCULAR | Status: AC
  Filled 2020-04-21: qty 2

## 2020-04-21 MED ORDER — DEXAMETHASONE SODIUM PHOSPHATE 10 MG/ML IJ SOLN
10.0000 mg | Freq: Once | INTRAMUSCULAR | Status: AC
  Administered 2020-04-21: 10 mg

## 2020-04-21 MED ORDER — MIDAZOLAM HCL 5 MG/5ML IJ SOLN
INTRAMUSCULAR | Status: AC
  Filled 2020-04-21: qty 5

## 2020-04-21 MED ORDER — FENTANYL CITRATE (PF) 100 MCG/2ML IJ SOLN
25.0000 ug | INTRAMUSCULAR | Status: AC | PRN
  Administered 2020-04-21 (×2): 25 ug via INTRAVENOUS

## 2020-04-21 MED ORDER — ROPIVACAINE HCL 2 MG/ML IJ SOLN
INTRAMUSCULAR | Status: AC
  Filled 2020-04-21: qty 10

## 2020-04-21 MED ORDER — LIDOCAINE HCL 2 % IJ SOLN
INTRAMUSCULAR | Status: AC
  Filled 2020-04-21: qty 20

## 2020-04-21 MED ORDER — ROPIVACAINE HCL 2 MG/ML IJ SOLN: 9.0000 mL | Freq: Once | INTRAMUSCULAR | Status: DC

## 2020-04-21 NOTE — Progress Notes (Signed)
PROVIDER NOTE: Information contained herein reflects review and annotations entered in association with encounter. Interpretation of such information and data should be left to medically-trained personnel. Information provided to patient can be located elsewhere in the medical record under "Patient Instructions". Document created using STT-dictation technology, any transcriptional errors that may result from process are unintentional.    Patient: Kathryn Ramirez  Service Category: Procedure  Provider: Gillis Santa, MD  DOB: 11-Jun-1951  DOS: 04/21/2020  Location: [facility]  MRN: [id number]  Setting: Ambulatory - outpatient  Referring Provider: Gale Journey, MD  Type: Established Patient  Specialty: Interventional Pain Management  PCP: Gale Journey, MD   Primary Reason for Visit: Interventional Pain Management Treatment. CC: Neck Pain  Procedure:          Anesthesia, Analgesia, Anxiolysis:  Type: Cervical Facet Medial Branch Block(s)  #1  Primary Purpose: Diagnostic Region: Posterolateral cervical spine Level: C2, C3, C4, C5, Medial Branch Level(s). Injecting these levels blocks the C2-3, C3-4, C4-5 cervical facet joints. Laterality: Bilateral  Type: Moderate (Conscious) Sedation combined with Local Anesthesia Indication(s): Analgesia and Anxiety Route: Intravenous (IV) IV Access: Secured Sedation: Meaningful verbal contact was maintained at all times during the procedure  Local Anesthetic: Lidocaine 1-2%  Position: Prone with head of the table raised to facilitate breathing.   Indications: 1. Cervical facet joint syndrome (C2-C5)   2. Arthropathy of cervical facet joint   3. Cervical spondylosis without myelopathy    Pain Score: Pre-procedure: 5 /10 Post-procedure: 3 /10   Patient recently diagnosed with psoriatic arthritis by rheumatology.  Did not want to pursue infusion therapy.  Patient is here today for diagnostic bilateral cervical facet medial branch  nerve blocks at C2, C3, C4, C5 for cervical facet joint syndrome.  Pre-op Assessment:  Ms. Heinlein is a 69 y.o. (year old), female patient, seen today for interventional treatment. She  has a past surgical history that includes Neuroplasty major nerve; Polypectomy; Throat surgery; Flexible bronchoscopy (N/A, 01/13/2016); Cataract extraction w/ intraocular lens  implant, bilateral; Cervical spine surgery (2017); Colonoscopy with propofol (N/A, 09/14/2016); Esophagogastroduodenoscopy (egd) with propofol (N/A, 09/14/2016); Total knee arthroplasty (Right, 06/15/2017); Joint replacement; and Carpal tunnel release (Left, 02/21/2020). Ms. Vincent has a current medication list which includes the following prescription(s): acetaminophen, albuterol, alendronate, aripiprazole, bupropion, calcipotriene, diazepam, ferrous sulfate, gabapentin, melatonin, oxybutynin, pantoprazole, rosuvastatin, senna, stiolto respimat, synjardy xr, trazodone, hydrocodone-acetaminophen, and ondansetron, and the following Facility-Administered Medications: midazolam and ropivacaine (pf) 2 mg/ml (0.2%). Her primarily concern today is the Neck Pain  Initial Vital Signs:  Pulse/HCG Rate: 78ECG Heart Rate: 69 Temp: 98.7 F (37.1 C) Resp: 18 BP: (!) 138/98 SpO2: 96 %  BMI: Estimated body mass index is 31.76 kg/m as calculated from the following:   Height as of this encounter: 5\' 4"  (1.626 m).   Weight as of this encounter: 185 lb (83.9 kg).  Risk Assessment: Allergies: Reviewed. She is allergic to codeine, haldol [haloperidol], haloperidol lactate, meloxicam, and trileptal [oxcarbazepine].  Allergy Precautions: None required Coagulopathies: Reviewed. None identified.  Blood-thinner therapy: None at this time Active Infection(s): Reviewed. None identified. Ms. Hoffert is afebrile  Site Confirmation: Ms. Redlich was asked to confirm the procedure and laterality before marking the site Procedure checklist: Completed Consent: Before the  procedure and under the influence of no sedative(s), amnesic(s), or anxiolytics, the patient was informed of the treatment options, risks and possible complications. To fulfill our ethical and legal obligations, as recommended by the American Medical Association's Code of Ethics, I  have informed the patient of my clinical impression; the nature and purpose of the treatment or procedure; the risks, benefits, and possible complications of the intervention; the alternatives, including doing nothing; the risk(s) and benefit(s) of the alternative treatment(s) or procedure(s); and the risk(s) and benefit(s) of doing nothing. The patient was provided information about the general risks and possible complications associated with the procedure. These may include, but are not limited to: failure to achieve desired goals, infection, bleeding, organ or nerve damage, allergic reactions, paralysis, and death. In addition, the patient was informed of those risks and complications associated to Spine-related procedures, such as failure to decrease pain; infection (i.e.: Meningitis, epidural or intraspinal abscess); bleeding (i.e.: epidural hematoma, subarachnoid hemorrhage, or any other type of intraspinal or peri-dural bleeding); organ or nerve damage (i.e.: Any type of peripheral nerve, nerve root, or spinal cord injury) with subsequent damage to sensory, motor, and/or autonomic systems, resulting in permanent pain, numbness, and/or weakness of one or several areas of the body; allergic reactions; (i.e.: anaphylactic reaction); and/or death. Furthermore, the patient was informed of those risks and complications associated with the medications. These include, but are not limited to: allergic reactions (i.e.: anaphylactic or anaphylactoid reaction(s)); adrenal axis suppression; blood sugar elevation that in diabetics may result in ketoacidosis or comma; water retention that in patients with history of congestive heart failure  may result in shortness of breath, pulmonary edema, and decompensation with resultant heart failure; weight gain; swelling or edema; medication-induced neural toxicity; particulate matter embolism and blood vessel occlusion with resultant organ, and/or nervous system infarction; and/or aseptic necrosis of one or more joints. Finally, the patient was informed that Medicine is not an exact science; therefore, there is also the possibility of unforeseen or unpredictable risks and/or possible complications that may result in a catastrophic outcome. The patient indicated having understood very clearly. We have given the patient no guarantees and we have made no promises. Enough time was given to the patient to ask questions, all of which were answered to the patient's satisfaction. Ms. Digiulio has indicated that she wanted to continue with the procedure. Attestation: I, the ordering provider, attest that I have discussed with the patient the benefits, risks, side-effects, alternatives, likelihood of achieving goals, and potential problems during recovery for the procedure that I have provided informed consent. Date  Time: 04/21/2020 11:13 AM  Pre-Procedure Preparation:  Monitoring: As per clinic protocol. Respiration, ETCO2, SpO2, BP, heart rate and rhythm monitor placed and checked for adequate function Safety Precautions: Patient was assessed for positional comfort and pressure points before starting the procedure. Time-out: I initiated and conducted the "Time-out" before starting the procedure, as per protocol. The patient was asked to participate by confirming the accuracy of the "Time Out" information. Verification of the correct person, site, and procedure were performed and confirmed by me, the nursing staff, and the patient. "Time-out" conducted as per Joint Commission's Universal Protocol (UP.01.01.01). Time: 1151  Description of Procedure:          Laterality: Bilateral. The procedure was performed in  identical fashion on both sides. Level: C2, C3, C4, C5,  Medial Branch Level(s). Area Prepped: Posterior Cervico-thoracic Region DuraPrep (Iodine Povacrylex [0.7% available iodine] and Isopropyl Alcohol, 74% w/w) Safety Precautions: Aspiration looking for blood return was conducted prior to all injections. At no point did we inject any substances, as a needle was being advanced. Before injecting, the patient was told to immediately notify me if she was experiencing any new onset of "ringing  in the ears, or metallic taste in the mouth". No attempts were made at seeking any paresthesias. Safe injection practices and needle disposal techniques used. Medications properly checked for expiration dates. SDV (single dose vial) medications used. After the completion of the procedure, all disposable equipment used was discarded in the proper designated medical waste containers. Local Anesthesia: Protocol guidelines were followed. The patient was positioned over the fluoroscopy table. The area was prepped in the usual manner. The time-out was completed. The target area was identified using fluoroscopy. A 12-in long, straight, sterile hemostat was used with fluoroscopic guidance to locate the targets for each level blocked. Once located, the skin was marked with an approved surgical skin marker. Once all sites were marked, the skin (epidermis, dermis, and hypodermis), as well as deeper tissues (fat, connective tissue and muscle) were infiltrated with a small amount of a short-acting local anesthetic, loaded on a 10cc syringe with a 25G, 1.5-in  Needle. An appropriate amount of time was allowed for local anesthetics to take effect before proceeding to the next step. Local Anesthetic: Lidocaine 2.0% The unused portion of the local anesthetic was discarded in the proper designated containers. Technical explanation of process:  C2 Medial Branch Nerve Block (MBB): The target area for the C2 dorsal medial articular branch is  the lateral concave waist of the articular pillar of C2. Under fluoroscopic guidance, a Quincke needle was inserted until contact was made with os over the postero-lateral aspect of the articular pillar of C3 (target area). After negative aspiration for blood, 1 mL of the nerve block solution was injected without difficulty or complication. The needle was removed intact. C3 Medial Branch Nerve Block (MBB): The target area for the C3 dorsal medial articular branch is the lateral concave waist of the articular pillar of C3. Under fluoroscopic guidance, a Quincke needle was inserted until contact was made with os over the postero-lateral aspect of the articular pillar of C3 (target area). After negative aspiration for blood, 27mL of the nerve block solution was injected without difficulty or complication. The needle was removed intact. C4 Medial Branch Nerve Block (MBB): The target area for the C4 dorsal medial articular branch is the lateral concave waist of the articular pillar of C4. Under fluoroscopic guidance, a Quincke needle was inserted until contact was made with os over the postero-lateral aspect of the articular pillar of C4 (target area). After negative aspiration for blood, 22mL of the nerve block solution was injected without difficulty or complication. The needle was removed intact. C5 Medial Branch Nerve Block (MBB): The target area for the C5 dorsal medial articular branch is the lateral concave waist of the articular pillar of C5. Under fluoroscopic guidance, a Quincke needle was inserted until contact was made with os over the postero-lateral aspect of the articular pillar of C5 (target area). After negative aspiration for blood, 1 mL of the nerve block solution was injected without difficulty or complication. The needle was removed intact. . Procedural Needles: 25-gauge, 3.5-inch, Quincke needles used for all levels. Nerve block solution: 8 cc solution made of 6 cc of 0.2% ropivacaine, 2 cc of  Decadron 10 mg/cc.  1 cc injected at each level above bilaterally.  The unused portion of the solution was discarded in the proper designated containers.  Once the entire procedure was completed, the treated area was cleaned, making sure to leave some of the prepping solution back to take advantage of its long term bactericidal properties.  Anatomy Reference Guide:  Vitals:   04/21/20 1203 04/21/20 1208 04/21/20 1210 04/21/20 1220  BP: 122/81 105/72 103/69 136/64  Pulse: 79 76 78   Resp: 12 13 14 12   Temp:    98.6 F (37 C)  SpO2: 96% 93% 95% 98%  Weight:      Height:        Start Time: 1151 hrs. End Time: 1209 hrs.  Imaging Guidance (Spinal):          Type of Imaging Technique: Fluoroscopy Guidance (Spinal) Indication(s): Assistance in needle guidance and placement for procedures requiring needle placement in or near specific anatomical locations not easily accessible without such assistance. Exposure Time: Please see nurses notes. Contrast: None used. Fluoroscopic Guidance: I was personally present during the use of fluoroscopy. "Tunnel Vision Technique" used to obtain the best possible view of the target area. Parallax error corrected before commencing the procedure. "Direction-depth-direction" technique used to introduce the needle under continuous pulsed fluoroscopy. Once target was reached, antero-posterior, oblique, and lateral fluoroscopic projection used confirm needle placement in all planes. Images permanently stored in EMR. Interpretation: No contrast injected. I personally interpreted the imaging intraoperatively. Adequate needle placement confirmed in multiple planes. Permanent images saved into the patient's record.  Antibiotic Prophylaxis:   Anti-infectives (From admission, onward)   None     Indication(s): None identified  Post-operative Assessment:  Post-procedure Vital Signs:  Pulse/HCG Rate: 7869 Temp: 98.6 F (37 C) Resp: 12 BP: 136/64 SpO2: 98  %  EBL: None  Complications: No immediate post-treatment complications observed by team, or reported by patient.  Note: The patient tolerated the entire procedure well. A repeat set of vitals were taken after the procedure and the patient was kept under observation following institutional policy, for this type of procedure. Post-procedural neurological assessment was performed, showing return to baseline, prior to discharge. The patient was provided with post-procedure discharge instructions, including a section on how to identify potential problems. Should any problems arise concerning this procedure, the patient was given instructions to immediately contact us, at any time, without hesitation. In any case, we plan to contact the patient by telephone for a follow-up status report regarding this interventional procedure.  Comments:  No additional relevant information.  Plan of Care  Orders:  Orders Placed This Encounter  Procedures  . DG [facility] C-ARM 1-60 MIN NO REPORT    Intraoperative interpretation by procedural physician at [place].    Standing Status:   Standing    Number of Occurrences:   1    Order Specific Question:   Reason for exam:    Answer:   Assistance in needle guidance and placement for procedures requiring needle placement in or near specific anatomical locations not easily accessible without such assistance.   Medications ordered for procedure: Meds ordered this encounter  Medications  . lidocaine (XYLOCAINE) 2 % (with pres) injection 400 mg  . fentaNYL (SUBLIMAZE) injection 25-50 mcg    Make sure Narcan is available in the pyxis when using this medication. In the event of respiratory depression (RR< 8/min): Titrate NARCAN (naloxone) in increments of 0.1 to 0.2 mg IV at 2-3 minute intervals, until desired degree of reversal.  . midazolam (VERSED) 5 MG/5ML injection 1-2 mg    Make sure Flumazenil is available in the pyxis when using this medication.  If oversedation occurs, administer 0.2 mg IV over 15 sec. If after 45 sec no response, administer 0.2 mg again over 1 min; may repeat at 1 min intervals; not to  exceed 4 doses (1 mg)  . ropivacaine (PF) 2 mg/mL (0.2%) (NAROPIN) injection 9 mL  . ropivacaine (PF) 2 mg/mL (0.2%) (NAROPIN) injection 9 mL  . dexamethasone (DECADRON) injection 10 mg  . dexamethasone (DECADRON) injection 10 mg   Medications administered: We administered lidocaine, fentaNYL, midazolam, ropivacaine (PF) 2 mg/mL (0.2%), dexamethasone, and dexamethasone.  See the medical record for exact dosing, route, and time of administration.  Follow-up plan:   Return in about 4 weeks (around 05/19/2020) for virtual.    Recent Visits Date Type Provider Dept  04/08/20 Office Visit Gillis Santa, MD [facility]  Showing recent visits within past 90 days and meeting all other requirements Today's Visits Date Type Provider Dept  04/21/20 Procedure visit Gillis Santa, MD [facility]  Showing today's visits and meeting all other requirements Future Appointments Date Type Provider Dept  05/19/20 Appointment Gillis Santa, MD [facility]  Showing future appointments within next 90 days and meeting all other requirements  Disposition: Discharge home  Discharge (Date  Time): 04/21/2020;   hrs.   Primary Care Physician: Gale Journey, MD Location: [facility] Note by: Gillis Santa, MD Date: 04/21/2020; Time: 12:33 PM  Disclaimer:  Medicine is not an exact science. The only guarantee in medicine is that nothing is guaranteed. It is important to note that the decision to proceed with this intervention was based on the information collected from the patient. The Data and conclusions were drawn from the patient's questionnaire, the interview, and the physical examination. Because the information was provided in large part by the patient, it cannot be guaranteed that it has  not been purposely or unconsciously manipulated. Every effort has been made to obtain as much relevant data as possible for this evaluation. It is important to note that the conclusions that lead to this procedure are derived in large part from the available data. Always take into account that the treatment will also be dependent on availability of resources and existing treatment guidelines, considered by other Pain Management Practitioners as being common knowledge and practice, at the time of the intervention. For Medico-Legal purposes, it is also important to point out that variation in procedural techniques and pharmacological choices are the acceptable norm. The indications, contraindications, technique, and results of the above procedure should only be interpreted and judged by a Board-Certified Interventional Pain Specialist with extensive familiarity and expertise in the same exact procedure and technique.

## 2020-04-21 NOTE — Progress Notes (Signed)
Safety precautions to be maintained throughout the outpatient stay will include: orient to surroundings, keep bed in low position, maintain call bell within reach at all times, provide assistance with transfer out of bed and ambulation.  

## 2020-04-22 ENCOUNTER — Telehealth: Payer: Self-pay | Admitting: *Deleted

## 2020-04-22 NOTE — Telephone Encounter (Signed)
No problems post procedure. 

## 2020-04-30 ENCOUNTER — Other Ambulatory Visit: Payer: Self-pay

## 2020-04-30 ENCOUNTER — Ambulatory Visit
Admission: EM | Admit: 2020-04-30 | Discharge: 2020-04-30 | Disposition: A | Discharge status: Home / Self Care | Payer: Medicare Other | Attending: Family Medicine | Admitting: Family Medicine

## 2020-04-30 DIAGNOSIS — M069 Rheumatoid arthritis, unspecified: Secondary | ICD-10-CM | POA: Insufficient documentation

## 2020-04-30 DIAGNOSIS — Z981 Arthrodesis status: Secondary | ICD-10-CM | POA: Insufficient documentation

## 2020-04-30 DIAGNOSIS — Z87891 Personal history of nicotine dependence: Secondary | ICD-10-CM | POA: Diagnosis not present

## 2020-04-30 DIAGNOSIS — J441 Chronic obstructive pulmonary disease with (acute) exacerbation: Secondary | ICD-10-CM | POA: Diagnosis not present

## 2020-04-30 DIAGNOSIS — E039 Hypothyroidism, unspecified: Secondary | ICD-10-CM | POA: Diagnosis not present

## 2020-04-30 DIAGNOSIS — Z8619 Personal history of other infectious and parasitic diseases: Secondary | ICD-10-CM | POA: Diagnosis not present

## 2020-04-30 DIAGNOSIS — I10 Essential (primary) hypertension: Secondary | ICD-10-CM | POA: Insufficient documentation

## 2020-04-30 DIAGNOSIS — R0789 Other chest pain: Secondary | ICD-10-CM | POA: Diagnosis present

## 2020-04-30 DIAGNOSIS — D649 Anemia, unspecified: Secondary | ICD-10-CM | POA: Insufficient documentation

## 2020-04-30 DIAGNOSIS — F319 Bipolar disorder, unspecified: Secondary | ICD-10-CM | POA: Diagnosis not present

## 2020-04-30 DIAGNOSIS — E785 Hyperlipidemia, unspecified: Secondary | ICD-10-CM | POA: Insufficient documentation

## 2020-04-30 DIAGNOSIS — M4712 Other spondylosis with myelopathy, cervical region: Secondary | ICD-10-CM | POA: Diagnosis not present

## 2020-04-30 DIAGNOSIS — Z7984 Long term (current) use of oral hypoglycemic drugs: Secondary | ICD-10-CM | POA: Diagnosis not present

## 2020-04-30 DIAGNOSIS — Z79899 Other long term (current) drug therapy: Secondary | ICD-10-CM | POA: Diagnosis not present

## 2020-04-30 DIAGNOSIS — M1711 Unilateral primary osteoarthritis, right knee: Secondary | ICD-10-CM | POA: Diagnosis not present

## 2020-04-30 DIAGNOSIS — E1142 Type 2 diabetes mellitus with diabetic polyneuropathy: Secondary | ICD-10-CM | POA: Insufficient documentation

## 2020-04-30 DIAGNOSIS — K219 Gastro-esophageal reflux disease without esophagitis: Secondary | ICD-10-CM | POA: Insufficient documentation

## 2020-04-30 DIAGNOSIS — E871 Hypo-osmolality and hyponatremia: Secondary | ICD-10-CM | POA: Diagnosis not present

## 2020-04-30 DIAGNOSIS — N3281 Overactive bladder: Secondary | ICD-10-CM | POA: Insufficient documentation

## 2020-04-30 DIAGNOSIS — Z20822 Contact with and (suspected) exposure to covid-19: Secondary | ICD-10-CM | POA: Insufficient documentation

## 2020-04-30 MED ORDER — PREDNISONE 50 MG PO TABS: ORAL_TABLET | ORAL | 0 refills | Status: DC

## 2020-04-30 NOTE — Discharge Instructions (Signed)
Continue inhalers.   COVID test will be back tomorrow.  Medication as prescribed.  Take care  Dr. Lacinda Axon

## 2020-04-30 NOTE — ED Provider Notes (Addendum)
[facility]    CSN: [id number] Arrival date & time: 04/30/20  1201      History   Chief Complaint Chief Complaint  Patient presents with  . Asthma   HPI  69 year old female presents with chest tightness.   Started yesterday.  Patient reports chest tightness and some associated shortness of breath.  Seems to be worse when she takes a deep breath.  Has known asthma as well as COPD.  She has been using her home inhalers without improvement.  She had a recent exposure to a sick individual.  She is concerned about the possibility of COVID-19.  Desires testing today.  No other reported symptoms.  No other complaints.  Past Medical History:  Diagnosis Date  . Acid reflux disease   . Anemia   . Asthma   . Bipolar 1 disorder ([place])   . COPD (chronic obstructive pulmonary disease) ([place])   . Diabetes mellitus without complication ([place])   . History of hepatitis C 2010   took interferon x 1 year  . Hypertension   . Liver hemangioma   . Lung nodule   . Psoriasis   . Psoriatic arthritis ([place])   . Rheumatoid arthritis ([place])   . Tardive dyskinesia   . Wears dentures    full upper and lower    Patient Active Problem List   Diagnosis Date Noted  . S/P cervical spinal fusion (C5/6 and C6/7) 04/08/2020  . Neuroforaminal stenosis of cervical spine (moderate, Left C3/4, C4/5; R C4/5) 04/08/2020  . Pain in limb 01/27/2018  . 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 Erroneous encounter - disregard 03/29/2017  . Heartburn   . Gastritis without bleeding   . Hx of colonic polyps   . Arthritis 09/02/2016  . Hep C w/o coma, chronic ([place]) 09/02/2016  . Hyperlipidemia, unspecified 09/02/2016  . Hypertension 09/02/2016  . Tardive dyskinesia 09/02/2016  . Urinary incontinence 09/02/2016  . History of hypothyroidism 08/19/2016  . Neck swelling 08/19/2016  . Acute asthma exacerbation 08/19/2016  . GERD (gastroesophageal reflux disease) 08/09/2016  . Dry mouth 06/01/2016  .  Elevated antinuclear antibody (ANA) level 06/01/2016  . Generalized osteoarthritis of hand 06/01/2016  . Degenerative joint disease of right knee 06/01/2016  . 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 Chest pain 04/06/2016  . Drug-induced peripheral neuropathy ([place]) 03/25/2016  . Sensory ataxia 03/25/2016  . Wrist pain, chronic, left 03/25/2016  . Impairment of balance 02/05/2016  . Acute right-sided low back pain without sciatica 02/05/2016  . Normal blood pressure 01/23/2016  . Pulmonary infiltrates   . Chronic cough   . Pulmonary scarring 01/06/2016  . OAB (overactive bladder) 12/18/2015  . Asthma 09/18/2015  . Multiple pulmonary nodules 09/15/2015  . Baker's cyst of knee, right 07/02/2015  . Bipolar disorder with moderate depression ([place]) 12/19/2014  . Chronic pain 12/19/2014  . Carpal tunnel syndrome of left wrist 10/01/2014  . Chronic RUQ pain 09/19/2014  . Esophageal dysphagia 09/19/2014  . Hemangioma of liver 09/19/2014  . Arthropathy of cervical facet joint 08/06/2014  . Cervical spondylosis without myelopathy 07/12/2014  . Cervical myelopathy with cervical radiculopathy ([place]) 03/24/2014  . Hyponatremia 02/21/2014  . Cervicalgia 09/18/2013  . 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 CARPAL TUNNEL RELEASE Left 02/21/2020   Procedure: CARPAL TUNNEL RELEASE ENDOSCOPIC;  Surgeon: Corky Mull, MD;  Location: [facility];  Service: Orthopedics;  Laterality: Left;  .  CATARACT EXTRACTION W/ INTRAOCULAR LENS  IMPLANT, BILATERAL    . CERVICAL SPINE SURGERY  2017   [facility] [place]  . COLONOSCOPY WITH PROPOFOL N/A 09/14/2016   Procedure: COLONOSCOPY WITH PROPOFOL;  Surgeon: Lucilla Lame, MD;  Location: [facility];  Service: Endoscopy;  Laterality: N/A;  . ESOPHAGOGASTRODUODENOSCOPY (EGD) WITH PROPOFOL N/A 09/14/2016   Procedure: ESOPHAGOGASTRODUODENOSCOPY (EGD) WITH PROPOFOL;  Surgeon: Lucilla Lame, MD;  Location: [facility] END[facility];  Service: Endoscopy;  Laterality: N/A;  . FLEXIBLE BRONCHOSCOPY N/A 01/13/2016   Procedure: FLEXIBLE BRONCHOSCOPY;  Surgeon: Vilinda Boehringer, MD;  Location: [facility];  Service: Cardiopulmonary;  Laterality: N/A;  . JOINT REPLACEMENT    . NEUROPLASTY MAJOR NERVE    . POLYPECTOMY    . THROAT SURGERY    . TOTAL KNEE ARTHROPLASTY Right 06/15/2017   Procedure: TOTAL KNEE ARTHROPLASTY;  Surgeon: Earnestine Leys, MD;  Location: [facility];  Service: Orthopedics;  Laterality: Right;    OB History   No obstetric history on file.      Home Medications    Prior to Admission medications   Medication Sig Start Date End Date Taking? Authorizing Provider  acetaminophen (TYLENOL) 500 MG tablet Take 500 mg by mouth 2 (two) times daily.   Yes [provider]  albuterol (VENTOLIN HFA) 108 (90 Base) MCG/ACT inhaler INHALE 2 PUFFS EVERY 4 HOURS AS NEEDED FOR WHEEZING FOR SHORTNESS OF BREATH 03/26/20  Yes Tyler Pita, MD  alendronate (FOSAMAX) 70 MG tablet Take 1 tablet (70 mg total) by mouth every 7 (seven) days. Take with a full glass of water on an empty stomach. Patient taking differently: Take 70 mg by mouth every 7 (seven) days. Take with a full glass of water on an empty stomach. Either on Saturday or Sunday 01/20/17  Yes Keith Rake Asad A, MD  ARIPiprazole (ABILIFY) 5 MG tablet Take 7.5 mg by mouth daily.    Yes [provider]  buPROPion (WELLBUTRIN XL) 150 MG 24 hr tablet Take 150 mg by mouth daily.   Yes [provider]  calcipotriene (DOVONOX) 0.005 % ointment Apply topically 2 (two) times daily. Patient taking differently: Apply 1 application topically 2 (two) times daily as needed (psoriasis).  05/02/17  Yes Roselee Nova, MD  diazepam (VALIUM) 2 MG tablet Take 1 mg by mouth at bedtime.    Yes [provider]  gabapentin (NEURONTIN) 600 MG tablet Take 300 mg by mouth 3 (three) times daily.    Yes [provider]   ondansetron (ZOFRAN ODT) 4 MG disintegrating tablet Take 1 tablet (4 mg total) by mouth every 8 (eight) hours as needed for nausea or vomiting. 02/21/20  Yes Poggi, Marshall Cork, MD  oxybutynin (DITROPAN) 5 MG tablet Take 5 mg by mouth every morning.    Yes [provider]  pantoprazole (PROTONIX) 40 MG tablet TAKE 1 TABLET (40 MG TOTAL) BY MOUTH EVERY EVENING. 02/21/20 02/20/21 Yes Tyler Pita, MD  rosuvastatin (CRESTOR) 10 MG tablet Take 1 tablet (10 mg total) by mouth at bedtime. 10/03/17  Yes Lada, Satira Anis, MD  STIOLTO RESPIMAT 2.5-2.5 MCG/ACT AERS INHALE 2 PUFFS INTO LUNGS EVERY DAY Patient taking differently: Inhale 2 puffs into the lungs daily.  08/16/19  Yes Tyler Pita, MD  SYNJARDY XR 25-1000 MG TB24 Take 1 tablet by mouth daily.  08/30/19  Yes [provider]  traZODone (DESYREL) 150 MG tablet Take 150 mg by mouth at bedtime.    Yes [provider]  ferrous sulfate 325 (65 FE) MG tablet Take 325 mg by mouth daily with breakfast.    [provider]  predniSONE (DELTASONE) 50 MG tablet 1 tablet daily x 5 days 04/30/20   Coral Spikes, DO    Family History Family History  Problem Relation Age of Onset  . Cancer Mother        Esophageal Ca  . Asthma Brother   . Breast cancer Neg Hx     Social History Social History   Tobacco Use  . Smoking status: Former Smoker    Packs/day: 1.00    Years: 30.00    Pack years: 30.00    Types: E-cigarettes    Quit date: 09/19/2015    Years since quitting: 4.6  . Smokeless tobacco: Never Used  Vaping Use  . Vaping Use: Never used  Substance Use Topics  . Alcohol use: Not Currently    Alcohol/week: 0.0 standard drinks    Comment: 03/31/2012 sobierty   . Drug use: No     Allergies   Codeine, Haldol [haloperidol], Haloperidol lactate, Meloxicam, and Trileptal [oxcarbazepine]   Review of Systems Review of Systems  Constitutional: Negative for fever.  Respiratory: Positive for chest tightness and  shortness of breath.    Physical Exam Triage Vital Signs ED Triage Vitals  Enc Vitals Group     BP 04/30/20 1248 120/82     Pulse Rate 04/30/20 1248 76     Resp 04/30/20 1248 16     Temp 04/30/20 1248 98 F (36.7 C)     Temp Source 04/30/20 1248 Oral     SpO2 04/30/20 1248 96 %     Weight --      Height --      Head Circumference --      Peak Flow --      Pain Score 04/30/20 1251 5     Pain Loc --      Pain Edu? --      Excl. in [place]? --    Updated Vital Signs BP 120/82 (BP Location: Left Arm)   Pulse 76   Temp 98 F (36.7 C) (Oral)   Resp 16   SpO2 96%   Visual Acuity Right Eye Distance:   Left Eye Distance:   Bilateral Distance:    Right Eye Near:   Left Eye Near:    Bilateral Near:     Physical Exam Vitals and nursing note reviewed.  Constitutional:      General: She is not in acute distress.    Appearance: Normal appearance. She is not ill-appearing.  HENT:     Head: Normocephalic and atraumatic.  Eyes:     General:        Right eye: No discharge.        Left eye: No discharge.     Conjunctiva/sclera: Conjunctivae normal.  Cardiovascular:     Rate and Rhythm: Normal rate and regular rhythm.     Heart sounds: No murmur heard.   Pulmonary:     Effort: Pulmonary effort is normal.     Breath sounds: Normal breath sounds. No wheezing, rhonchi or rales.  Neurological:     Mental Status: She is alert.  Psychiatric:        Mood and Affect: Mood normal.        Behavior: Behavior normal.    UC Treatments / Results  Labs (all labs ordered are listed, but only abnormal results are displayed) Labs Reviewed  SARS CORONAVIRUS 2 (  TAT 6-24 HRS)    EKG Interpretation: Normal sinus rhythm at the rate of 69.  Normal axis.  Normal intervals.  No ST or T wave changes.  Normal EKG.  Radiology No results found.  Procedures Procedures (including critical care time)  Medications Ordered in UC Medications - No data to display  Initial Impression / Assessment  and Plan / UC Course  I have reviewed the triage vital signs and the nursing notes.  Pertinent labs & imaging results that were available during my care of the patient were reviewed by me and considered in my medical decision making (see chart for details).    69 year old female presents with COPD exacerbation.  Mild. Treating with prednisone.  No indications for antibiotics at this time.  Awaiting Covid test results.  Final Clinical Impressions(s) / UC Diagnoses   Final diagnoses:  COPD exacerbation ([place])     Discharge Instructions     Continue inhalers.   COVID test will be back tomorrow.  Medication as prescribed.  Take care  Dr. Lacinda Axon    ED Prescriptions    Medication Sig Dispense Auth. Provider   predniSONE (DELTASONE) 50 MG tablet 1 tablet daily x 5 days 5 tablet Thersa Salt G, DO     PDMP not reviewed this encounter.   Coral Spikes, DO 04/30/20 1333    Thersa Salt G, DO 04/30/20 1334

## 2020-04-30 NOTE — ED Triage Notes (Signed)
Pt states she has been on inhalers for a while and they are not providing any relief as of today.  Feels tightness in lungs.  Reports sometimes when taking a deep breathe in the last couple days she has pain, pressure and stabbing in chest and back. Feels 'unsettled' in chest as if breathing isn't reaching there.  Denies cough, fever or other symptoms.

## 2020-05-01 LAB — SARS CORONAVIRUS 2 (TAT 6-24 HRS): SARS Coronavirus 2: NEGATIVE

## 2020-05-13 ENCOUNTER — Ambulatory Visit
Payer: Medicare Other | Attending: Student in an Organized Health Care Education/Training Program | Admitting: Student in an Organized Health Care Education/Training Program

## 2020-05-13 ENCOUNTER — Other Ambulatory Visit: Payer: Self-pay

## 2020-05-13 ENCOUNTER — Encounter: Payer: Self-pay | Admitting: Student in an Organized Health Care Education/Training Program

## 2020-05-13 VITALS — Wt 185.0 lb

## 2020-05-13 DIAGNOSIS — M47812 Spondylosis without myelopathy or radiculopathy, cervical region: Secondary | ICD-10-CM

## 2020-05-13 DIAGNOSIS — G894 Chronic pain syndrome: Secondary | ICD-10-CM

## 2020-05-13 DIAGNOSIS — M4802 Spinal stenosis, cervical region: Secondary | ICD-10-CM

## 2020-05-13 DIAGNOSIS — M542 Cervicalgia: Secondary | ICD-10-CM

## 2020-05-13 DIAGNOSIS — Z981 Arthrodesis status: Secondary | ICD-10-CM

## 2020-05-13 MED ORDER — METHYLPREDNISOLONE 4 MG PO TBPK: ORAL_TABLET | ORAL | 0 refills | Status: AC

## 2020-05-13 NOTE — Progress Notes (Signed)
Patient: Kathryn Ramirez  Service Category: E/M  Provider: Gillis Santa, MD  DOB: [birth year]  DOS: 05/13/2020  Location: Office  MRN: [id number]  Setting: Ambulatory outpatient  Referring Provider: Gale Journey, MD  Type: Established Patient  Specialty: Interventional Pain Management  PCP: Gale Journey, MD  Location: Home  Delivery: TeleHealth     Virtual Encounter - Pain Management PROVIDER NOTE: Information contained herein reflects review and annotations entered in association with encounter. Interpretation of such information and data should be left to medically-trained personnel. Information provided to patient can be located elsewhere in the medical record under "Patient Instructions". Document created using STT-dictation technology, any transcriptional errors that may result from process are unintentional.    Contact & Pharmacy Preferred: [phone] Home: [phone] (home) Mobile: [phone] (mobile) E-mail: [email or web address]  [place], [place] [address] Phone: [phone] Fax: [phone]   Pre-screening  Kathryn Ramirez offered "in-person" vs "virtual" encounter. She indicated preferring virtual for this encounter.   Reason COVID-19*  Social distancing based on CDC and AMA recommendations.   I contacted Kathryn Ramirez on 05/13/2020 via video conference.      I clearly identified myself as Gillis Santa, MD. I verified that I was speaking with the correct person using two identifiers (Name: Kathryn Ramirez, and date of birth: January 26, 1951).  Consent I sought verbal advanced consent from Kathryn Ramirez for virtual visit interactions. I informed Kathryn Ramirez of possible security and privacy concerns, risks, and limitations associated with providing "not-in-person" medical evaluation and management services. I also informed Kathryn Ramirez of the availability of "in-person" appointments. Finally, I informed her that there would  be a charge for the virtual visit and that she could be  personally, fully or partially, financially responsible for it. Kathryn Ramirez expressed understanding and agreed to proceed.   Historic Elements   Kathryn Ramirez is a 69 y.o. year old, female patient evaluated today after our last contact on 04/21/2020. Kathryn Ramirez  has a past medical history of Acid reflux disease, Anemia, Asthma, Bipolar 1 disorder ([place]), COPD (chronic obstructive pulmonary disease) ([place]), Diabetes mellitus without complication ([place]), History of hepatitis C (2010), Hypertension, Liver hemangioma, Lung nodule, Psoriasis, Psoriatic arthritis ([place]), Rheumatoid arthritis ([place]), Tardive dyskinesia, and Wears dentures. She also  has a past surgical history that includes Neuroplasty major nerve; Polypectomy; Throat surgery; Flexible bronchoscopy (N/A, 01/13/2016); Cataract extraction w/ intraocular lens  implant, bilateral; Cervical spine surgery (2017); Colonoscopy with propofol (N/A, 09/14/2016); Esophagogastroduodenoscopy (egd) with propofol (N/A, 09/14/2016); Total knee arthroplasty (Right, 06/15/2017); Joint replacement; and Carpal tunnel release (Left, 02/21/2020). Kathryn Ramirez has a current medication list which includes the following prescription(s): acetaminophen, albuterol, aripiprazole, bupropion, diazepam, gabapentin, ondansetron, oxybutynin, pantoprazole, rosuvastatin, synjardy xr, trazodone, and methylprednisolone. She  reports that she quit smoking about 4 years ago. Her smoking use included e-cigarettes. She has a 30.00 pack-year smoking history. She has never used smokeless tobacco. She reports previous alcohol use. She reports that she does not use drugs. Kathryn Ramirez is allergic to codeine, haldol [haloperidol], haloperidol lactate, meloxicam, and trileptal [oxcarbazepine].   HPI  Today, she is being contacted for a post-procedure assessment.    Post-Procedure Evaluation  Procedure (04/21/2020):   Type: Cervical Facet Medial  Branch Block(s)  #1  Primary Purpose: Diagnostic Region: Posterolateral cervical spine Level: C2, C3, C4, C5, Medial Branch Level(s). Injecting these levels blocks the C2-3, C3-4, C4-5 cervical facet joints. Laterality: Bilateral  Sedation: Please see nurses note.  Effectiveness during initial hour after procedure(Ultra-Short Term Relief): 50 %   Local anesthetic used: Long-acting (4-6 hours) Effectiveness: Defined as any analgesic benefit obtained secondary to the administration of local anesthetics. This carries significant diagnostic value as to the etiological location, or anatomical origin, of the pain. Duration of benefit is expected to coincide with the duration of the local anesthetic used.  Effectiveness during initial 4-6 hours after procedure(Short-Term Relief): 50 %   Long-term benefit: Defined as any relief past the pharmacologic duration of the local anesthetics.  Effectiveness past the initial 6 hours after procedure(Long-Term Relief): 0 %   Current benefits: Defined as benefit that persist at this time.   Analgesia:  No benefit Function: No benefit ROM: No benefit   Laboratory Chemistry Profile   Renal Lab Results  Component Value Date   BUN 15 02/19/2020   CREATININE 0.80 02/19/2020   LABCREA 56 58/52/7782   BCR NOT APPLICABLE 42/35/3614   GFRAA >60 02/19/2020   GFRNONAA >60 02/19/2020     Hepatic Lab Results  Component Value Date   AST 18 02/19/2020   ALT 14 02/19/2020   ALBUMIN 4.1 02/19/2020   ALKPHOS 50 02/19/2020     Electrolytes Lab Results  Component Value Date   NA 134 (L) 02/19/2020   K 3.7 02/19/2020   CL 102 02/19/2020   CALCIUM 8.8 (L) 02/19/2020     Bone Lab Results  Component Value Date   VD25OH 30 12/27/2016     Inflammation (CRP: Acute Phase) (ESR: Chronic Phase) Lab Results  Component Value Date   ESRSEDRATE 11 07/31/2015       Note: Above Lab results reviewed.   Assessment  The primary encounter diagnosis was  Cervical facet joint syndrome (C2-C5). Diagnoses of Arthropathy of cervical facet joint, Cervical spondylosis without myelopathy, Cervicalgia, S/P cervical spinal fusion (C5/6 and C6/7), Neuroforaminal stenosis of cervical spine (moderate, Left C3/4, C4/5; R C4/5), and Chronic pain syndrome were also pertinent to this visit.  Plan of Care  Kathryn Ramirez has a current medication list which includes the following long-term medication(s): albuterol, gabapentin, pantoprazole, rosuvastatin, and trazodone.  Unfortunately, diagnostic cervical facet medial branch nerve blocks were not effective for her neck pain related to cervical facet arthropathy. States that right knee pain is better.  Continues to endorse right knee stiffness but states that pain is not as severe.  Consider right knee genicular nerve block in future and possible radiofrequency ablation. Medrol Dosepak as below for neck pain exacerbation   Pharmacotherapy (Medications Ordered): Meds ordered this encounter  Medications  . methylPREDNISolone (MEDROL) 4 MG TBPK tablet    Sig: Follow package instructions.    Dispense:  21 tablet    Refill:  0    Do not add to the "Automatic Refill" notification system.   Orders:  No orders of the defined types were placed in this encounter.  Follow-up plan:   Return in about 3 months (around 08/13/2020) for virtual.   Recent Visits Date Type Provider Dept  04/21/20 Procedure visit Gillis Santa, MD [facility]  04/08/20 Office Visit Gillis Santa, MD [facility]  Showing recent visits within past 90 days and meeting all other requirements Future Appointments No visits were found meeting these conditions. Showing future appointments within next 90 days and meeting all other requirements  I discussed the assessment and treatment plan with the patient. The patient was provided an opportunity to ask questions and all were  answered. The patient agreed with the plan and  demonstrated an understanding of the instructions.  Patient advised to call back or seek an in-person evaluation if the symptoms or condition worsens.  Duration of encounter: 20 minutes.  Note by: Gillis Santa, MD Date: 05/13/2020; Time: 2:25 PM

## 2020-05-19 ENCOUNTER — Telehealth: Payer: Medicare Other | Admitting: Student in an Organized Health Care Education/Training Program

## 2020-05-22 ENCOUNTER — Telehealth: Payer: Self-pay | Admitting: Pulmonary Disease

## 2020-05-22 MED ORDER — PREDNISONE 10 MG PO TABS
ORAL_TABLET | ORAL | 0 refills | Status: DC
Start: 2020-05-22 — End: 2020-08-12

## 2020-05-22 NOTE — Telephone Encounter (Signed)
Patient is aware of recommendations and voiced her understanding. Rx for prednisone has been sent to preferred pharmacy.  Nothing further needed.

## 2020-05-22 NOTE — Telephone Encounter (Signed)
Can send script for repeat course of prednisone 10 mg pill >> 3 pills daily for 2 days, 2 pills daily for 2 days, 1 pill daily for 2 days.  Please also schedule ROV with Dr. Patsey Berthold or NP in next one week.

## 2020-05-22 NOTE — Telephone Encounter (Signed)
Lm for patient.  

## 2020-05-22 NOTE — Telephone Encounter (Signed)
Called spoke to patient, who reports of increased sob,productive cough with clear mucus and wheezing x3w. Denied fever, chills or sweats.  Patient was seen by UC on 04/30/2020 and was given a course of prednisone. Mild improvement with prednisone.  Using albuterol HFA Q6H and stiolto daily with some relief.  Not currently taking OTC meds to help with sx.  She has had both covid vaccines.   Dr. Halford Chessman, please advise as Dr. Patsey Berthold is unavailable.

## 2020-06-08 ENCOUNTER — Other Ambulatory Visit: Payer: Self-pay

## 2020-06-08 ENCOUNTER — Emergency Department: Payer: Medicare Other

## 2020-06-08 ENCOUNTER — Encounter: Payer: Self-pay | Admitting: Emergency Medicine

## 2020-06-08 ENCOUNTER — Emergency Department
Admission: EM | Admit: 2020-06-08 | Discharge: 2020-06-08 | Disposition: A | Discharge status: Home / Self Care | Payer: Medicare Other | Attending: Emergency Medicine | Admitting: Emergency Medicine

## 2020-06-08 DIAGNOSIS — J45909 Unspecified asthma, uncomplicated: Secondary | ICD-10-CM | POA: Insufficient documentation

## 2020-06-08 DIAGNOSIS — Z87891 Personal history of nicotine dependence: Secondary | ICD-10-CM | POA: Diagnosis not present

## 2020-06-08 DIAGNOSIS — J449 Chronic obstructive pulmonary disease, unspecified: Secondary | ICD-10-CM | POA: Diagnosis not present

## 2020-06-08 DIAGNOSIS — Z96651 Presence of right artificial knee joint: Secondary | ICD-10-CM | POA: Insufficient documentation

## 2020-06-08 DIAGNOSIS — M25561 Pain in right knee: Secondary | ICD-10-CM | POA: Insufficient documentation

## 2020-06-08 DIAGNOSIS — E119 Type 2 diabetes mellitus without complications: Secondary | ICD-10-CM | POA: Insufficient documentation

## 2020-06-08 DIAGNOSIS — I10 Essential (primary) hypertension: Secondary | ICD-10-CM | POA: Diagnosis not present

## 2020-06-08 MED ORDER — HYDROCODONE-ACETAMINOPHEN 5-325 MG PO TABS
1.0000 | ORAL_TABLET | Freq: Once | ORAL | Status: AC
  Administered 2020-06-08: 1 via ORAL
  Filled 2020-06-08: qty 1

## 2020-06-08 MED ORDER — CELECOXIB 50 MG PO CAPS
50.0000 mg | ORAL_CAPSULE | Freq: Two times a day (BID) | ORAL | 0 refills | Status: AC
Start: 2020-06-08 — End: 2020-06-13

## 2020-06-08 MED ORDER — ONDANSETRON 4 MG PO TBDP
4.0000 mg | ORAL_TABLET | Freq: Once | ORAL | Status: AC
  Administered 2020-06-08: 4 mg via ORAL
  Filled 2020-06-08: qty 1

## 2020-06-08 NOTE — ED Notes (Signed)
See triage note-Right knee pain that started yesterday, constant and non-radiating.

## 2020-06-08 NOTE — Discharge Instructions (Signed)
Take Celebrex twice daily for the next 5 days.

## 2020-06-08 NOTE — ED Notes (Signed)
First nurse informed this tech that patient was in Xray. This tech found patient in the main waiting room and brought into a room in flex.

## 2020-06-08 NOTE — ED Provider Notes (Signed)
Emergency Department Provider Note  ____________________________________________  Time seen: Approximately 6:31 PM  I have reviewed the triage vital signs and the nursing notes.   HISTORY  Chief Complaint Knee Pain   Historian Patient     HPI Kathryn Ramirez is a 69 y.o. female presents to the emergency department with acute posterior right knee pain that started yesterday.  Patient states that she has been going to a water aerobics class and has been slightly more physically active than normal.  She denies falls or mechanisms of trauma.  States that she has no history of posterior right knee pain in the past.  Denies redness or swelling of the right lower extremity.  Denies prolonged immobilization, use of hormone treatments, daily smoking or recent travel.  No alleviating measures have been attempted.   Past Medical History:  Diagnosis Date  . Acid reflux disease   . Anemia   . Asthma   . Bipolar 1 disorder ([place])   . COPD (chronic obstructive pulmonary disease) ([place])   . Diabetes mellitus without complication ([place])   . History of hepatitis C 2010   took interferon x 1 year  . Hypertension   . Liver hemangioma   . Lung nodule   . Psoriasis   . Psoriatic arthritis ([place])   . Rheumatoid arthritis ([place])   . Tardive dyskinesia   . Wears dentures    full upper and lower     Immunizations up to date:  Yes.     Past Medical History:  Diagnosis Date  . Acid reflux disease   . Anemia   . Asthma   . Bipolar 1 disorder ([place])   . COPD (chronic obstructive pulmonary disease) ([place])   . Diabetes mellitus without complication ([employer])   . History of hepatitis C 2010   took interferon x 1 year  . Hypertension   . Liver hemangioma   . Lung nodule   . Psoriasis   . Psoriatic arthritis ([place])   . Rheumatoid arthritis ([place])   . Tardive dyskinesia   . Wears dentures    full upper and lower    Patient Active Problem List   Diagnosis Date Noted  . S/P cervical spinal  fusion (C5/6 and C6/7) 04/08/2020  . Neuroforaminal stenosis of cervical spine (moderate, Left C3/4, C4/5; R C4/5) 04/08/2020  . Pain in limb 01/27/2018  . 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 Erroneous encounter - disregard 03/29/2017  . Heartburn   . Gastritis without bleeding   . Hx of colonic polyps   . Arthritis 09/02/2016  . Hep C w/o coma, chronic ([place]) 09/02/2016  . Hyperlipidemia, unspecified 09/02/2016  . Hypertension 09/02/2016  . Tardive dyskinesia 09/02/2016  . Urinary incontinence 09/02/2016  . History of hypothyroidism 08/19/2016  . Neck swelling 08/19/2016  . Acute asthma exacerbation 08/19/2016  . GERD (gastroesophageal reflux disease) 08/09/2016  . Dry mouth 06/01/2016  . Elevated antinuclear antibody (ANA) level 06/01/2016  . Generalized osteoarthritis of hand 06/01/2016  . Degenerative joint disease of right knee 06/01/2016  . 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 Chest pain 04/06/2016  . 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 Sensory ataxia 03/25/2016  . Wrist pain, chronic, left 03/25/2016  . Impairment of balance 02/05/2016  . Acute right-sided low back pain without sciatica 02/05/2016  . Normal blood pressure 01/23/2016  . Pulmonary infiltrates   . Chronic cough   . Pulmonary scarring 01/06/2016  . OAB (overactive bladder) 12/18/2015  . Asthma 09/18/2015  .  Multiple pulmonary nodules 09/15/2015  . Baker's cyst of knee, right 07/02/2015  . Bipolar disorder with moderate depression ([place]) 12/19/2014  . Chronic pain 12/19/2014  . Carpal tunnel syndrome of left wrist 10/01/2014  . Chronic RUQ pain 09/19/2014  . Esophageal dysphagia 09/19/2014  . Hemangioma of liver 09/19/2014  . Arthropathy of cervical facet joint 08/06/2014  . Cervical facet joint syndrome 07/12/2014  . Cervical myelopathy with cervical radiculopathy ([place]) 03/24/2014  . Hyponatremia 02/21/2014  . Cervicalgia 09/18/2013  . 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 CARPAL TUNNEL RELEASE Left 02/21/2020   Procedure: CARPAL TUNNEL RELEASE ENDOSCOPIC;  Surgeon: Corky Mull, MD;  Location: [facility] ORS;  Service: Orthopedics;  Laterality: Left;  . CATARACT EXTRACTION W/ INTRAOCULAR LENS  IMPLANT, BILATERAL    . CERVICAL SPINE SURGERY  2017   [facility] [place]  . COLONOSCOPY WITH PROPOFOL N/A 09/14/2016   Procedure: COLONOSCOPY WITH PROPOFOL;  Surgeon: Lucilla Lame, MD;  Location: [facility] ENDOSCOPY;  Service: Endoscopy;  Laterality: N/A;  . ESOPHAGOGASTRODUODENOSCOPY (EGD) WITH PROPOFOL N/A 09/14/2016   Procedure: ESOPHAGOGASTRODUODENOSCOPY (EGD) WITH PROPOFOL;  Surgeon: Lucilla Lame, MD;  Location: [facility] ENDOSCOPY;  Service: Endoscopy;  Laterality: N/A;  . FLEXIBLE BRONCHOSCOPY N/A 01/13/2016   Procedure: FLEXIBLE BRONCHOSCOPY;  Surgeon: Vilinda Boehringer, MD;  Location: [facility] ORS;  Service: Cardiopulmonary;  Laterality: N/A;  . JOINT REPLACEMENT    . NEUROPLASTY MAJOR NERVE    . POLYPECTOMY    . THROAT SURGERY    . TOTAL KNEE ARTHROPLASTY Right 06/15/2017   Procedure: TOTAL KNEE ARTHROPLASTY;  Surgeon: Earnestine Leys, MD;  Location: [facility] ORS;  Service: Orthopedics;  Laterality: Right;    Prior to Admission medications   Medication Sig Start Date End Date Taking? Authorizing Provider  acetaminophen (TYLENOL) 500 MG tablet Take 500 mg by mouth 2 (two) times daily.    [provider]  albuterol (VENTOLIN HFA) 108 (90 Base) MCG/ACT inhaler INHALE 2 PUFFS EVERY 4 HOURS AS NEEDED FOR WHEEZING FOR SHORTNESS OF BREATH 03/26/20   Tyler Pita, MD  ARIPiprazole (ABILIFY) 5 MG tablet Take 7.5 mg by mouth daily.     [provider]  buPROPion (WELLBUTRIN XL) 150 MG 24 hr tablet Take 150 mg by mouth daily.    [provider]  celecoxib (CELEBREX) 50 MG capsule Take 1 capsule (50 mg total) by mouth 2 (two) times daily for 5 days. 06/08/20 06/13/20  Lannie Fields, PA-C  diazepam (VALIUM) 2 MG tablet Take 1 mg by mouth at bedtime.     [provider]  gabapentin (NEURONTIN) 600 MG tablet Take 300 mg by mouth 3 (three) times daily.     [provider]  ondansetron (ZOFRAN ODT) 4 MG disintegrating tablet Take 1 tablet (4 mg total) by mouth every 8 (eight) hours as needed for nausea or vomiting. 02/21/20   Poggi, Marshall Cork, MD  oxybutynin (DITROPAN) 5 MG tablet Take 5 mg by mouth every morning.     [provider]  pantoprazole (PROTONIX) 40 MG tablet TAKE 1 TABLET (40 MG TOTAL) BY MOUTH EVERY EVENING. 02/21/20 02/20/21  Tyler Pita, MD  predniSONE (DELTASONE) 10 MG tablet 3tab x2d, 2tab x2d, 1tab x2d. 05/22/20   Chesley Mires, MD  rosuvastatin (CRESTOR) 10 MG tablet Take 1 tablet (10 mg total) by mouth at bedtime. 10/03/17   Arnetha Courser, MD  SYNJARDY XR 25-1000 MG TB24 Take  1 tablet by mouth daily.  08/30/19   [provider]  traZODone (DESYREL) 150 MG tablet Take 200 mg by mouth at bedtime.     [provider]    Allergies Codeine, Haldol [haloperidol], Haloperidol lactate, Meloxicam, and Trileptal [oxcarbazepine]  Family History  Problem Relation Age of Onset  . Cancer Mother        Esophageal Ca  . Asthma Brother   . Breast cancer Neg Hx     Social History Social History   Tobacco Use  . Smoking status: Former Smoker    Packs/day: 1.00    Years: 30.00    Pack years: 30.00    Types: E-cigarettes    Quit date: 09/19/2015    Years since quitting: 4.7  . Smokeless tobacco: Never Used  Vaping Use  . Vaping Use: Never used  Substance Use Topics  . Alcohol use: Not Currently    Alcohol/week: 0.0 standard drinks    Comment: 03/31/2012 sobierty   . Drug use: No     Review of Systems  Constitutional: No fever/chills Eyes:  No discharge ENT: No upper respiratory complaints. Respiratory: no cough. No SOB/ use of accessory muscles to breath Gastrointestinal:   No nausea, no vomiting.   No diarrhea.  No constipation. Musculoskeletal: Patient has right knee pain.  Skin: Negative for rash, abrasions, lacerations, ecchymosis.  ____________________________________________   PHYSICAL EXAM:  VITAL SIGNS: ED Triage Vitals [06/08/20 1659]  Enc Vitals Group     BP 136/79     Pulse Rate 84     Resp 20     Temp 98.6 F (37 C)     Temp Source Oral     SpO2 94 %     Weight 188 lb (85.3 kg)     Height 5\' 4"  (1.626 m)     Head Circumference      Peak Flow      Pain Score 5     Pain Loc      Pain Edu?      Excl. in [place]?      Constitutional: Alert and oriented. Well appearing and in no acute distress. Eyes: Conjunctivae are normal. PERRL. EOMI. Head: Atraumatic. Cardiovascular: Normal rate, regular rhythm. Normal S1 and S2.  Good peripheral circulation. Respiratory: Normal respiratory effort without tachypnea or retractions. Lungs CTAB. Good air entry to the bases with no decreased or absent breath sounds Gastrointestinal: Bowel sounds x 4 quadrants. Soft and nontender to palpation. No guarding or rigidity. No distention. Musculoskeletal: Full range of motion to all extremities. No obvious deformities noted.  No calf pain to palpation on the right.  Palpable dorsalis pedis pulse bilaterally and symmetrically.  Capillary refill less than 2 seconds on the right. Neurologic:  Normal for age. No gross focal neurologic deficits are appreciated.  Skin:  Skin is warm, dry and intact. No rash noted. Psychiatric: Mood and affect are normal for age. Speech and behavior are normal.   ____________________________________________   LABS (all labs ordered are listed, but only abnormal results are displayed)  Labs Reviewed - No data to display ____________________________________________  EKG   ____________________________________________  RADIOLOGY Unk Pinto, personally viewed and evaluated these images (plain radiographs) as part of my medical decision making, as  well as reviewing the written report by the radiologist.  US Venous Img Lower Unilateral Right  Result Date: 06/08/2020 CLINICAL DATA:  Concern for DVT. EXAM: RIGHT LOWER EXTREMITY VENOUS DOPPLER ULTRASOUND TECHNIQUE: Gray-scale sonography with compression, as well  as color and duplex ultrasound, were performed to evaluate the deep venous system(s) from the level of the common femoral vein through the popliteal and proximal calf veins. COMPARISON:  None. FINDINGS: VENOUS Normal compressibility of the common femoral, superficial femoral, and popliteal veins, as well as the visualized calf veins. Visualized portions of profunda femoral vein and great saphenous vein unremarkable. No filling defects to suggest DVT on grayscale or color Doppler imaging. Doppler waveforms show normal direction of venous flow, normal respiratory plasticity and response to augmentation. Limited views of the contralateral common femoral vein are unremarkable. OTHER None. Limitations: none IMPRESSION: Negative. Electronically Signed   By: Constance Holster M.D.   On: 06/08/2020 19:00   DG Knee Complete 4 Views Right  Result Date: 06/08/2020 CLINICAL DATA:  Right knee pain EXAM: RIGHT KNEE - COMPLETE 4+ VIEW COMPARISON:  12/28/2017 FINDINGS: Frontal, bilateral oblique, lateral views of the right knee are obtained. Three component right knee arthroplasty is identified in the expected position. Ossific densities are seen along the anterior and posterior margin of the joint space, and could reflect loose bodies or heterotopic ossification after surgery. No acute fracture. Small joint effusion. Soft tissues are unremarkable. IMPRESSION: 1. No acute bony abnormality. 2. Right knee arthroplasty as above. 3. Possible loose bodies versus heterotopic ossification at the anterior and posterior margins of the joint capsule. Electronically Signed   By: Randa Ngo M.D.   On: 06/08/2020 18:11     ____________________________________________    PROCEDURES  Procedure(s) performed:     Procedures     Medications  HYDROcodone-acetaminophen (NORCO/VICODIN) 5-325 MG per tablet 1 tablet (1 tablet Oral Given 06/08/20 1845)  ondansetron (ZOFRAN-ODT) disintegrating tablet 4 mg (4 mg Oral Given 06/08/20 1845)     ____________________________________________   INITIAL IMPRESSION / ASSESSMENT AND PLAN / ED COURSE  Pertinent labs & imaging results that were available during my care of the patient were reviewed by me and considered in my medical decision making (see chart for details).      Assessment and plan:  Right knee pain:  69 year old female presents to the emergency department with acute right posterior knee pain that started yesterday.  Vital signs are reassuring at triage.  On physical exam, patient was alert, active and nontoxic-appearing.  Right knee and left knee appeared symmetrical.  There was no provocative deficits noted with testing.  No erythema or edema of the right lower extremity.  Differential diagnosis includes musculoskeletal pain versus DVT.  No evidence of DVT on venous ultrasound.  No bony abnormality was visualized on x-ray.  Patient was discharged with a Cox 2 selective NSAID, Celebrex for the next 5 days was advised to follow-up with orthopedics as needed.    ____________________________________________  FINAL CLINICAL IMPRESSION(S) / ED DIAGNOSES  Final diagnoses:  Acute pain of right knee      NEW MEDICATIONS STARTED DURING THIS VISIT:  ED Discharge Orders         Ordered    celecoxib (CELEBREX) 50 MG capsule  2 times daily        06/08/20 1908              This chart was dictated using voice recognition software/Dragon. Despite best efforts to proofread, errors can occur which can change the meaning. Any change was purely unintentional.      Lannie Fields, PA-C 06/08/20 2020    Nena Polio, MD 06/08/20  2342

## 2020-06-08 NOTE — ED Notes (Signed)
This tech called patient phone number and no answer. Pt has also been called twice from the waiting area and no answer.

## 2020-06-08 NOTE — ED Triage Notes (Signed)
Pt to ED via POV, ambulatory to triage room without difficulty. Pt c/o R knee pain that started today. Pt hx of R knee replacement.

## 2020-06-08 NOTE — ED Notes (Signed)
No peripheral IV placed this visit.   Discharge instructions reviewed with patient. Questions fielded by this RN. Patient verbalizes understanding of instructions. Patient discharged home in stable condition per [place], [place] . No acute distress noted at time of discharge.    Pt reports having all belongings

## 2020-06-08 NOTE — ED Notes (Signed)
US at bedside

## 2020-08-12 ENCOUNTER — Ambulatory Visit
Payer: Medicare Other | Attending: Student in an Organized Health Care Education/Training Program | Admitting: Student in an Organized Health Care Education/Training Program

## 2020-08-12 ENCOUNTER — Encounter: Payer: Self-pay | Admitting: Student in an Organized Health Care Education/Training Program

## 2020-08-12 ENCOUNTER — Other Ambulatory Visit: Payer: Self-pay

## 2020-08-12 DIAGNOSIS — Z981 Arthrodesis status: Secondary | ICD-10-CM

## 2020-08-12 DIAGNOSIS — M4802 Spinal stenosis, cervical region: Secondary | ICD-10-CM | POA: Diagnosis not present

## 2020-08-12 DIAGNOSIS — M542 Cervicalgia: Secondary | ICD-10-CM

## 2020-08-12 DIAGNOSIS — G894 Chronic pain syndrome: Secondary | ICD-10-CM

## 2020-08-12 DIAGNOSIS — M47812 Spondylosis without myelopathy or radiculopathy, cervical region: Secondary | ICD-10-CM

## 2020-08-12 NOTE — Progress Notes (Signed)
Patient: Kathryn Ramirez  Service Category: E/M  Provider: Gillis Santa, MD  DOB: 04-Dec-1950  DOS: 08/12/2020  Location: Office  MRN: [id number]  Setting: Ambulatory outpatient  Referring Provider: Gale Journey, MD  Type: Established Patient  Specialty: Interventional Pain Management  PCP: Romualdo Bolk, FNP  Location: Home  Delivery: TeleHealth     Virtual Encounter - Pain Management PROVIDER NOTE: Information contained herein reflects review and annotations entered in association with encounter. Interpretation of such information and data should be left to medically-trained personnel. Information provided to patient can be located elsewhere in the medical record under "Patient Instructions". Document created using STT-dictation technology, any transcriptional errors that may result from process are unintentional.    Contact & Pharmacy Preferred: [phone] Home: [phone] (home) Mobile: [phone] (mobile) E-mail: [email or web address], [address] [place] [address] [place] [address] Phone: [phone] Fax: [phone]   Pre-screening  Kathryn Ramirez offered "in-person" vs "virtual" encounter. She indicated preferring virtual for this encounter.   Reason COVID-19*  Social distancing based on CDC and AMA recommendations.   I contacted Kathryn Ramirez on 08/12/2020 via video conference.      I clearly identified myself as Gillis Santa, MD. I verified that I was speaking with the correct person using two identifiers (Name: Kathryn Ramirez, and date of birth: Oct 06, 1950).  Consent I sought verbal advanced consent from Kathryn Ramirez for virtual visit interactions. I informed Kathryn Ramirez of possible security and privacy concerns, risks, and limitations associated with providing "not-in-person" medical evaluation and management services. I also informed Kathryn Ramirez of the availability of "in-person" appointments. Finally, I informed her that there would be  a charge for the virtual visit and that she could be  personally, fully or partially, financially responsible for it. Kathryn Ramirez expressed understanding and agreed to proceed.   Historic Elements   Kathryn Ramirez is a 70 y.o. year old, female patient evaluated today after our last contact on 04/21/2020. Kathryn Ramirez  has a past medical history of Acid reflux disease, Anemia, Asthma, Bipolar 1 disorder ([place]), COPD (chronic obstructive pulmonary disease) ([place]), Diabetes mellitus without complication ([place]), History of hepatitis C (2010), Hypertension, Liver hemangioma, Lung nodule, Psoriasis, Psoriatic arthritis ([place]), Rheumatoid arthritis ([place]), Tardive dyskinesia, and Wears dentures. She also  has a past surgical history that includes Neuroplasty major nerve; Polypectomy; Throat surgery; Flexible bronchoscopy (N/A, 01/13/2016); Cataract extraction w/ intraocular lens  implant, bilateral; Cervical spine surgery (2017); Colonoscopy with propofol (N/A, 09/14/2016); Esophagogastroduodenoscopy (egd) with propofol (N/A, 09/14/2016); Total knee arthroplasty (Right, 06/15/2017); Joint replacement; and Carpal tunnel release (Left, 02/21/2020). Kathryn Ramirez has a current medication list which includes the following prescription(s): acetaminophen, albuterol, aripiprazole, bupropion, diazepam, enbrel sureclick, gabapentin, ondansetron, oxybutynin, pantoprazole, rosuvastatin, synjardy xr, and trazodone. She  reports that she quit smoking about 4 years ago. Her smoking use included e-cigarettes. She has a 30.00 pack-year smoking history. She has never used smokeless tobacco. She reports previous alcohol use. She reports that she does not use drugs. Kathryn Ramirez is allergic to codeine, haldol [haloperidol], haloperidol lactate, meloxicam, and trileptal [oxcarbazepine].   HPI  Today, she is being contacted for worsening of previously known (established) problem  Patient endorses worsening neck pain.  She has a history of C5-C7 ACDF.   She is status post diagnostic cervical facet medial branch nerve blocks bilaterally at C2, C3, C4, C5 on 04/21/2020 which unfortunately were not effective for her neck pain.  She is currently on gabapentin 300 mg 3 times a day.  She also follows up with psychiatry.  Her gabapentin is managed by neurology.  I encouraged her to discuss transition to Lyrica as the patient is having limited analgesic benefit with gabapentin and could consider Lyrica however I defer to them.  Laboratory Chemistry Profile   Renal Lab Results  Component Value Date   BUN 15 02/19/2020   CREATININE 0.80 02/19/2020   LABCREA 56 40/98/1191   BCR NOT APPLICABLE 47/82/9562   GFRAA >60 02/19/2020   GFRNONAA >60 02/19/2020     Hepatic Lab Results  Component Value Date   AST 18 02/19/2020   ALT 14 02/19/2020   ALBUMIN 4.1 02/19/2020   ALKPHOS 50 02/19/2020     Electrolytes Lab Results  Component Value Date   NA 134 (L) 02/19/2020   K 3.7 02/19/2020   CL 102 02/19/2020   CALCIUM 8.8 (L) 02/19/2020     Bone Lab Results  Component Value Date   VD25OH 30 12/27/2016     Inflammation (CRP: Acute Phase) (ESR: Chronic Phase) Lab Results  Component Value Date   ESRSEDRATE 11 07/31/2015       Note: Above Lab results reviewed.  Imaging  US Venous Img Lower Unilateral Right CLINICAL DATA:  Concern for DVT.  EXAM: RIGHT LOWER EXTREMITY VENOUS DOPPLER ULTRASOUND  TECHNIQUE: Gray-scale sonography with compression, as well as color and duplex ultrasound, were performed to evaluate the deep venous system(s) from the level of the common femoral vein through the popliteal and proximal calf veins.  COMPARISON:  None.  FINDINGS: VENOUS  Normal compressibility of the common femoral, superficial femoral, and popliteal veins, as well as the visualized calf veins. Visualized portions of profunda femoral vein and great saphenous vein unremarkable. No filling defects to suggest DVT on grayscale or color  Doppler imaging. Doppler waveforms show normal direction of venous flow, normal respiratory plasticity and response to augmentation.  Limited views of the contralateral common femoral vein are unremarkable.  OTHER  None.  Limitations: none  IMPRESSION: Negative.  Electronically Signed   By: Constance Holster M.D.   On: 06/08/2020 19:00 DG Knee Complete 4 Views Right CLINICAL DATA:  Right knee pain  EXAM: RIGHT KNEE - COMPLETE 4+ VIEW  COMPARISON:  12/28/2017  FINDINGS: Frontal, bilateral oblique, lateral views of the right knee are obtained. Three component right knee arthroplasty is identified in the expected position. Ossific densities are seen along the anterior and posterior margin of the joint space, and could reflect loose bodies or heterotopic ossification after surgery. No acute fracture. Small joint effusion. Soft tissues are unremarkable.  IMPRESSION: 1. No acute bony abnormality. 2. Right knee arthroplasty as above. 3. Possible loose bodies versus heterotopic ossification at the anterior and posterior margins of the joint capsule.  Electronically Signed   By: Randa Ngo M.D.   On: 06/08/2020 18:11  Assessment  The primary encounter diagnosis was Cervical facet joint syndrome (C2-C5). Diagnoses of Arthropathy of cervical facet joint, Cervical spondylosis without myelopathy, Cervicalgia, S/P cervical spinal fusion (C5/6 and C6/7), Neuroforaminal stenosis of cervical spine (moderate, Left C3/4, C4/5; R C4/5), and Chronic pain syndrome were also pertinent to this visit.  Plan of Care   Discuss transition from gabapentin to Lyrica with neurology, follow-up as needed. Continue psychiatric care.  Follow-up plan:   Return if symptoms worsen or fail to improve.   Recent Visits No visits were found meeting these conditions. Showing recent visits within past 90  days and meeting all other requirements Today's Visits Date Type Provider Dept  08/12/20  Telemedicine Gillis Santa, MD [facility]  Showing today's visits and meeting all other requirements Future Appointments No visits were found meeting these conditions. Showing future appointments within next 90 days and meeting all other requirements  I discussed the assessment and treatment plan with the patient. The patient was provided an opportunity to ask questions and all were answered. The patient agreed with the plan and demonstrated an understanding of the instructions.  Patient advised to call back or seek an in-person evaluation if the symptoms or condition worsens.  Duration of encounter: 10 minutes.  Note by: Gillis Santa, MD Date: 08/12/2020; Time: 1:19 PM

## 2020-08-17 ENCOUNTER — Ambulatory Visit (INDEPENDENT_AMBULATORY_CARE_PROVIDER_SITE_OTHER): Payer: Medicare Other

## 2020-08-17 ENCOUNTER — Encounter: Payer: Self-pay | Admitting: Emergency Medicine

## 2020-08-17 ENCOUNTER — Other Ambulatory Visit: Payer: Self-pay

## 2020-08-17 ENCOUNTER — Ambulatory Visit
Admission: EM | Admit: 2020-08-17 | Discharge: 2020-08-17 | Disposition: A | Discharge status: Home / Self Care | Payer: Medicare Other | Attending: Physician Assistant | Admitting: Physician Assistant

## 2020-08-17 DIAGNOSIS — R0602 Shortness of breath: Secondary | ICD-10-CM | POA: Insufficient documentation

## 2020-08-17 DIAGNOSIS — R0981 Nasal congestion: Secondary | ICD-10-CM

## 2020-08-17 DIAGNOSIS — Z20822 Contact with and (suspected) exposure to covid-19: Secondary | ICD-10-CM | POA: Insufficient documentation

## 2020-08-17 DIAGNOSIS — R059 Cough, unspecified: Secondary | ICD-10-CM | POA: Diagnosis not present

## 2020-08-17 LAB — SARS CORONAVIRUS 2 (TAT 6-24 HRS): SARS Coronavirus 2: NEGATIVE

## 2020-08-17 MED ORDER — MONTELUKAST SODIUM 10 MG PO TABS: 10.0000 mg | ORAL_TABLET | Freq: Every day | ORAL | 0 refills | Status: DC

## 2020-08-17 MED ORDER — PREDNISONE 50 MG PO TABS: 50.0000 mg | ORAL_TABLET | Freq: Every day | ORAL | 0 refills | Status: AC

## 2020-08-17 NOTE — ED Triage Notes (Signed)
Patient c/o SOB that started today.  Patient reports some cough and chest congestion.  Patient has history of asthma and COPD.

## 2020-08-17 NOTE — Discharge Instructions (Addendum)
You were seen for shortness of breath and chest congestion and are being treated for the same.   You were tested for COVID-19. If you know you were exposed to a confirmed case of COVID-19, continue quarantine until your test results are available.  Interact as little as possible until COVID-19 test results are available.  Continue to keep her social distance of 6 feet from others, wash hands frequently and wear facemask when indoors or when you are unable to social distance in outdoor settings.  If you develop any symptoms, reach out to the urgent care for further instructions.  If your test results are positive, a member of the urgent care team will reach out to you with further instructions. If you have any further questions, please don't hesitate to reach out to the urgent care clinic.  Over the counter medications that will help your symptoms: Flonase (runny nose, congestion), Zyrtec or Xyzal (postnasal drip, sneezing), Tylenol/Ibuprofen (fever and body aches), Mucinex (congestion and cough).  Please sign up for MyChart to access your lab results.  Take care, Dr. [name], NP-c

## 2020-08-17 NOTE — ED Provider Notes (Signed)
[facility] - [place], [place]   Name: Kathryn Ramirez DOB: 1950/12/04 MRN: [id number] CSN: [id number] PCP: Romualdo Bolk, FNP  Arrival date and time:  08/17/20 1150  Chief Complaint:  Cough   NOTE: Prior to seeing the patient today, I have reviewed the triage nursing documentation and vital signs. Clinical staff has updated patient's PMH/PSHx, current medication list, and drug allergies/intolerances to ensure comprehensive history available to assist in medical decision making.   History:   HPI: Kathryn Ramirez is a 70 y.o. female who presents today with complaints of the following:  COVID-19 assessment Symptoms: Shortness of breath, coughing, chest congestion Symptom onset: This morning Vaccination status: Fully vaccinated Positive exposure: None known Social precautions: Wears a mask in public settings.  Social distances when available Employment risk: None.  Patient is retired. Household: Lives alone Previous COVID-19 test result: None Symptom management: Patient used two prescription inhalers and received minimal relief.   Past Medical History:  Diagnosis Date  . Acid reflux disease   . Anemia   . Asthma   . Bipolar 1 disorder ([place])   . COPD (chronic obstructive pulmonary disease) ([place])   . Diabetes mellitus without complication ([place])   . History of hepatitis C 2010   took interferon x 1 year  . Hypertension   . Liver hemangioma   . Lung nodule   . Psoriasis   . Psoriatic arthritis ([place])   . Rheumatoid arthritis ([place])   . Tardive dyskinesia   . Wears dentures    full upper and lower    Past Surgical History:  Procedure Laterality Date  . CARPAL TUNNEL RELEASE Left 02/21/2020   Procedure: CARPAL TUNNEL RELEASE ENDOSCOPIC;  Surgeon: Corky Mull, MD;  Location: [facility] ORS;  Service: Orthopedics;  Laterality: Left;  . CATARACT EXTRACTION W/ INTRAOCULAR LENS  IMPLANT, BILATERAL    . CERVICAL SPINE SURGERY  2017   [facility] [place]  . COLONOSCOPY  WITH PROPOFOL N/A 09/14/2016   Procedure: COLONOSCOPY WITH PROPOFOL;  Surgeon: Lucilla Lame, MD;  Location: [facility] ENDOSCOPY;  Service: Endoscopy;  Laterality: N/A;  . ESOPHAGOGASTRODUODENOSCOPY (EGD) WITH PROPOFOL N/A 09/14/2016   Procedure: ESOPHAGOGASTRODUODENOSCOPY (EGD) WITH PROPOFOL;  Surgeon: Lucilla Lame, MD;  Location: [facility] ENDOSCOPY;  Service: Endoscopy;  Laterality: N/A;  . FLEXIBLE BRONCHOSCOPY N/A 01/13/2016   Procedure: FLEXIBLE BRONCHOSCOPY;  Surgeon: Vilinda Boehringer, MD;  Location: [facility] ORS;  Service: Cardiopulmonary;  Laterality: N/A;  . JOINT REPLACEMENT    . NEUROPLASTY MAJOR NERVE    . POLYPECTOMY    . THROAT SURGERY    . TOTAL KNEE ARTHROPLASTY Right 06/15/2017   Procedure: TOTAL KNEE ARTHROPLASTY;  Surgeon: Earnestine Leys, MD;  Location: [facility] ORS;  Service: Orthopedics;  Laterality: Right;    Family History  Problem Relation Age of Onset  . Cancer Mother        Esophageal Ca  . Asthma Brother   . Breast cancer Neg Hx     Social History   Tobacco Use  . Smoking status: Former Smoker    Packs/day: 1.00    Years: 30.00    Pack years: 30.00    Types: E-cigarettes    Quit date: 09/19/2015    Years since quitting: 4.9  . Smokeless tobacco: Never Used  Vaping Use  . Vaping Use: Never used  Substance Use Topics  . Alcohol use: Not Currently    Alcohol/week: 0.0 standard drinks    Comment: 03/31/2012 sobierty   . Drug use: No  Patient Active Problem List   Diagnosis Date Noted  . S/P cervical spinal fusion (C5/6 and C6/7) 04/08/2020  . Neuroforaminal stenosis of cervical spine (moderate, Left C3/4, C4/5; R C4/5) 04/08/2020  . Pain in limb 01/27/2018  . 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 Erroneous encounter - disregard 03/29/2017  . Heartburn   . Gastritis without bleeding   . Hx of colonic polyps   . Arthritis 09/02/2016  . Hep C w/o coma, chronic ([place]) 09/02/2016  . Hyperlipidemia, unspecified 09/02/2016  . Hypertension 09/02/2016  . Tardive  dyskinesia 09/02/2016  . Urinary incontinence 09/02/2016  . History of hypothyroidism 08/19/2016  . Neck swelling 08/19/2016  . Acute asthma exacerbation 08/19/2016  . GERD (gastroesophageal reflux disease) 08/09/2016  . Dry mouth 06/01/2016  . Elevated antinuclear antibody (ANA) level 06/01/2016  . Generalized osteoarthritis of hand 06/01/2016  . Degenerative joint disease of right knee 06/01/2016  . 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 Chest pain 04/06/2016  . 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 Sensory ataxia 03/25/2016  . Wrist pain, chronic, left 03/25/2016  . Impairment of balance 02/05/2016  . Acute right-sided low back pain without sciatica 02/05/2016  . Normal blood pressure 01/23/2016  . Pulmonary infiltrates   . Chronic cough   . Pulmonary scarring 01/06/2016  . OAB (overactive bladder) 12/18/2015  . Asthma 09/18/2015  . Multiple pulmonary nodules 09/15/2015  . Baker's cyst of knee, right 07/02/2015  . Bipolar disorder with moderate depression ([place]) 12/19/2014  . Chronic pain 12/19/2014  . Carpal tunnel syndrome of left wrist 10/01/2014  . Chronic RUQ pain 09/19/2014  . Esophageal dysphagia 09/19/2014  . Hemangioma of liver 09/19/2014  . Arthropathy of cervical facet joint 08/06/2014  . Cervical facet joint syndrome 07/12/2014  . Cervical myelopathy with cervical radiculopathy ([place]) 03/24/2014  . Hyponatremia 02/21/2014  . Cervicalgia 09/18/2013  . 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 albuterol (VENTOLIN HFA) 108 (90 Base) MCG/ACT inhaler INHALE 2 PUFFS EVERY 4 HOURS AS NEEDED FOR WHEEZING FOR SHORTNESS OF BREATH  . ARIPiprazole (ABILIFY) 5 MG tablet Take 10 mg by mouth daily.  [name] buPROPion (WELLBUTRIN XL) 150 MG 24 hr tablet Take 150 mg by mouth daily.  . diazepam (VALIUM) 2 MG tablet Take 1 mg by mouth at bedtime.   [name] etanercept (ENBREL SURECLICK)  50 MG/ML injection   . gabapentin (NEURONTIN) 300 MG capsule Take 300 mg by mouth 3 (three) times daily.  . montelukast (SINGULAIR) 10 MG tablet Take 1 tablet (10 mg total) by mouth at bedtime for 14 days.  [name] oxybutynin (DITROPAN) 5 MG tablet Take 5 mg by mouth every morning.   . pantoprazole (PROTONIX) 40 MG tablet TAKE 1 TABLET (40 MG TOTAL) BY MOUTH EVERY EVENING.  . predniSONE (DELTASONE) 50 MG tablet Take 1 tablet (50 mg total) by mouth daily with breakfast for 5 days. Stop taking medication if your fasting blood sugar is >200mg .  . rosuvastatin (CRESTOR) 10 MG tablet Take 1 tablet (10 mg total) by mouth at bedtime.  [name] SYNJARDY XR 25-1000 MG TB24 Take 1 tablet by mouth daily.   . traZODone (DESYREL) 100 MG tablet Take 200 mg by mouth at bedtime.    Allergies:   Codeine, Haldol [haloperidol], Haloperidol lactate, Meloxicam, and Trileptal [oxcarbazepine]  Review of Systems (ROS): Review of Systems  Constitutional: Negative for activity change, appetite change, chills, diaphoresis and fatigue.  HENT: Negative for congestion, postnasal drip, sinus pain, sneezing and sore throat.   Respiratory: Positive for cough, shortness of breath and wheezing.   Cardiovascular: Negative for chest pain.  Gastrointestinal: Negative for diarrhea, nausea and vomiting.  All other systems reviewed and are negative.    Vital Signs: Today's Vitals   08/17/20 1156 08/17/20 1200 08/17/20 1247  BP:  135/82   Pulse:  84   Resp:  16   Temp:  98 F (36.7 C)   TempSrc:  Oral   SpO2:  96%   Weight: 192 lb (87.1 kg)    Height: 5\' 2"  (1.575 m)    PainSc: 3   3     Physical Exam: Physical Exam Vitals and nursing note reviewed.  Constitutional:      Appearance: Normal appearance.  Cardiovascular:     Rate and Rhythm: Normal rate and regular rhythm.     Pulses: Normal pulses.     Heart sounds: Normal heart sounds.  Pulmonary:     Effort: Pulmonary effort is normal.     Breath sounds: Normal breath  sounds. No decreased breath sounds or wheezing.  Skin:    General: Skin is warm and dry.  Neurological:     General: No focal deficit present.     Mental Status: She is alert and oriented to person, place, and time.  Psychiatric:        Mood and Affect: Mood normal.        Behavior: Behavior normal.      Urgent Care Treatments / Results:   LABS: PLEASE NOTE: all labs that were ordered this encounter are listed, however only abnormal results are displayed. Labs Reviewed  SARS CORONAVIRUS 2 (TAT 6-24 HRS)    EKG: -None  RADIOLOGY: DG Chest 2 View  Result Date: 08/17/2020 CLINICAL DATA:  Shortness of breath.  Cough and congestion. EXAM: CHEST - 2 VIEW COMPARISON:  Chest radiograph 03/14/2017 FINDINGS: The heart size and mediastinal contours are within normal limits. Both lungs are clear. The visualized skeletal structures are unremarkable. IMPRESSION: No active cardiopulmonary disease. Electronically Signed   By: Lovey Newcomer M.D.   On: 08/17/2020 12:27    PROCEDURES: Procedures  MEDICATIONS RECEIVED THIS VISIT: Medications - No data to display  PERTINENT CLINICAL COURSE NOTES/UPDATES:   Initial Impression / Assessment and Plan / Urgent Care Course:  Pertinent labs & imaging results that were available during my care of the patient were personally reviewed by me and considered in my medical decision making (see lab/imaging section of note for values and interpretations).  Kathryn Ramirez is a 70 y.o. female who presents to [facility] today with complaints of cough, diagnosed with the same/rule out COVID-19, and treated as such with the medications below. NP and patient reviewed discharge instructions below during visit.   Patient is well appearing overall in clinic today. She does not appear to be in any acute distress. Presenting symptoms (see HPI) and exam as documented above.   I have reviewed the follow up and strict return precautions for any new or worsening  symptoms. Patient is aware of symptoms that would be deemed urgent/emergent, and would thus require further evaluation either here or in the emergency department. At the time of discharge, she verbalized understanding and consent with the discharge plan as it was reviewed with her. All questions were fielded by provider and/or clinic staff prior to patient discharge.    Final Clinical Impressions / Urgent Care Diagnoses:  Final diagnoses:  Shortness of breath  Encounter for screening laboratory testing for COVID-19 virus    New Prescriptions:  [place] Controlled Substance Registry consulted? Not Applicable  Meds ordered this encounter  Medications  . predniSONE (DELTASONE) 50 MG tablet    Sig: Take 1 tablet (50 mg total) by mouth daily with breakfast for 5 days. Stop taking medication if your fasting blood sugar is >200mg .    Dispense:  5 tablet    Refill:  0  . montelukast (SINGULAIR) 10 MG tablet    Sig: Take 1 tablet (10 mg total) by mouth at bedtime for 14 days.    Dispense:  14 tablet    Refill:  0      Discharge Instructions     You were seen for shortness of breath and chest congestion and are being treated for the same.   You were tested for COVID-19. If you know you were exposed to a confirmed case of COVID-19, continue quarantine until your test results are available.  Interact as little as possible until COVID-19 test results are available.  Continue to keep her social distance of 6 feet from others, wash hands frequently and wear facemask when indoors or when you are unable to social distance in outdoor settings.  If you develop any symptoms, reach out to the urgent care for further instructions.  If your test results are positive, a member of the urgent care team will reach out to you with further instructions. If you have any further questions, please don't hesitate to reach out to the urgent care clinic.  Over the counter medications that will help your symptoms: Flonase  (runny nose, congestion), Zyrtec or Xyzal (postnasal drip, sneezing), Tylenol/Ibuprofen (fever and body aches), Mucinex (congestion and cough).  Please sign up for MyChart to access your lab results.  Take care, Dr. [name], NP-c     Recommended Follow up Care:  Patient encouraged to follow up with the following provider within the specified time frame, or sooner as dictated by the severity of her symptoms. As always, she was instructed that for any urgent/emergent care needs, she should seek care either here or in the emergency department for more immediate evaluation.   Gertie Baron, DNP, NP-c    Gertie Baron, NP 08/17/20 [id number]

## 2020-10-05 ENCOUNTER — Telehealth: Payer: Self-pay | Admitting: *Deleted

## 2020-10-06 ENCOUNTER — Other Ambulatory Visit: Payer: Self-pay | Admitting: *Deleted

## 2020-10-06 DIAGNOSIS — Z122 Encounter for screening for malignant neoplasm of respiratory organs: Secondary | ICD-10-CM

## 2020-10-06 DIAGNOSIS — Z87891 Personal history of nicotine dependence: Secondary | ICD-10-CM

## 2020-10-06 NOTE — Progress Notes (Signed)
Contacted and scheduled for annual lung screening scan. Patient is a former smoker, quit 2017, 30 pack year history.

## 2020-11-05 ENCOUNTER — Ambulatory Visit
Admission: EM | Admit: 2020-11-05 | Discharge: 2020-11-05 | Disposition: A | Discharge status: Home / Self Care | Payer: Medicare Other | Attending: Emergency Medicine | Admitting: Emergency Medicine

## 2020-11-05 ENCOUNTER — Encounter: Payer: Self-pay | Admitting: Emergency Medicine

## 2020-11-05 ENCOUNTER — Other Ambulatory Visit: Payer: Self-pay

## 2020-11-05 DIAGNOSIS — N3001 Acute cystitis with hematuria: Secondary | ICD-10-CM | POA: Insufficient documentation

## 2020-11-05 LAB — COMPREHENSIVE METABOLIC PANEL
ALT: 14 U/L (ref 0–44)
AST: 17 U/L (ref 15–41)
Albumin: 3.9 g/dL (ref 3.5–5.0)
Alkaline Phosphatase: 59 U/L (ref 38–126)
Anion gap: 7 (ref 5–15)
BUN: 13 mg/dL (ref 8–23)
CO2: 29 mmol/L (ref 22–32)
Calcium: 9.1 mg/dL (ref 8.9–10.3)
Chloride: 99 mmol/L (ref 98–111)
Creatinine, Ser: 0.82 mg/dL (ref 0.44–1.00)
GFR, Estimated: >60 mL/min (ref >60)
Glucose, Bld: 98 mg/dL (ref 70–99)
Potassium: 4 mmol/L (ref 3.5–5.1)
Sodium: 135 mmol/L (ref 135–145)
Total Bilirubin: 0.4 mg/dL (ref 0.3–1.2)
Total Protein: 7.9 g/dL (ref 6.5–8.1)

## 2020-11-05 LAB — CBC WITH DIFFERENTIAL/PLATELET
Abs Immature Granulocytes: 0.03 10*3/uL (ref 0.00–0.07)
Basophils Absolute: 0.1 10*3/uL (ref 0.0–0.1)
Basophils Relative: 0 %
Eosinophils Absolute: 0.2 10*3/uL (ref 0.0–0.5)
Eosinophils Relative: 2 %
HCT: 40.9 % (ref 36.0–46.0)
Hemoglobin: 12.7 g/dL (ref 12.0–15.0)
Immature Granulocytes: 0 %
Lymphocytes Relative: 17 %
Lymphs Abs: 2.2 10*3/uL (ref 0.7–4.0)
MCH: 26.5 pg (ref 26.0–34.0)
MCHC: 31.1 g/dL (ref 30.0–36.0)
MCV: 85.2 fL (ref 80.0–100.0)
Monocytes Absolute: 0.8 10*3/uL (ref 0.1–1.0)
Monocytes Relative: 6 %
Neutro Abs: 9.7 10*3/uL — ABNORMAL HIGH (ref 1.7–7.7)
Neutrophils Relative %: 75 %
Platelets: 266 10*3/uL (ref 150–400)
RBC: 4.8 MIL/uL (ref 3.87–5.11)
RDW: 14.1 % (ref 11.5–15.5)
WBC: 13 10*3/uL — ABNORMAL HIGH (ref 4.0–10.5)
nRBC: 0 % (ref 0.0–0.2)

## 2020-11-05 LAB — URINALYSIS, COMPLETE (UACMP) WITH MICROSCOPIC
Bilirubin Urine: NEGATIVE
Glucose, UA: 500 mg/dL — AB
Ketones, ur: NEGATIVE mg/dL
Nitrite: NEGATIVE
Protein, ur: NEGATIVE mg/dL
Specific Gravity, Urine: <1.005 — ABNORMAL LOW (ref 1.005–1.030)
pH: 5.5 (ref 5.0–8.0)

## 2020-11-05 LAB — LIPASE, BLOOD: Lipase: 28 U/L (ref 11–51)

## 2020-11-05 MED ORDER — SULFAMETHOXAZOLE-TRIMETHOPRIM 800-160 MG PO TABS: 1.0000 | ORAL_TABLET | Freq: Two times a day (BID) | ORAL | 0 refills | Status: AC

## 2020-11-05 MED ORDER — ONDANSETRON 8 MG PO TBDP: 8.0000 mg | ORAL_TABLET | Freq: Three times a day (TID) | ORAL | 0 refills | Status: DC | PRN

## 2020-11-05 MED ORDER — PHENAZOPYRIDINE HCL 200 MG PO TABS: 200.0000 mg | ORAL_TABLET | Freq: Three times a day (TID) | ORAL | 0 refills | Status: DC

## 2020-11-05 NOTE — ED Triage Notes (Signed)
Patient c/o diarrhea and nausea that started on Sunday. She also reports dysuria that started 3 days ago.

## 2020-11-05 NOTE — Discharge Instructions (Addendum)
Take the Bactrim twice daily for 7 days with a full glass of water for treatment of urinary tract infection.  Use the Pyridium every 8 hours as needed for urinary discomfort.  This will turn your urine a bright red-orange.  Increase your oral water intake so that you increase your urine production and flush your system.  Use the Zofran every 8 hours as needed for nausea and vomiting.  If you develop an increase in your abdominal pain, fever, nausea and vomiting cannot keep medication down, or chills you need to return for reevaluation or be seen in the emergency department.

## 2020-11-05 NOTE — ED Provider Notes (Signed)
[facility]    CSN: [id number] Arrival date & time: 11/05/20  8937      History   Chief Complaint Chief Complaint  Patient presents with  . Diarrhea  . Dysuria    HPI Kathryn Ramirez is a 70 y.o. female.   HPI   70 year old female here for evaluation of abdominal complaints.  Patient reports that for last 3 days she has been experiencing nausea before and after eating, abdominal bloating, generalized abdominal pain, urinary urgency, urinary frequency, back pain, and pain with urination.  Patient reports that she developed diarrhea before her urinary symptoms started.  Patient denies fever, vomiting, chills, hematuria, or blood in her stool.  Patient reports that she does have gallbladder disease and is due to see the surgeon on Nov 25, 2020 for consult for cholecystectomy.  Past Medical History:  Diagnosis Date  . Acid reflux disease   . Anemia   . Asthma   . Bipolar 1 disorder ([place])   . COPD (chronic obstructive pulmonary disease) ([place])   . Diabetes mellitus without complication ([place])   . History of hepatitis C 2010   took interferon x 1 year  . Hypertension   . Liver hemangioma   . Lung nodule   . Psoriasis   . Psoriatic arthritis ([place])   . Rheumatoid arthritis ([place])   . Tardive dyskinesia   . Wears dentures    full upper and lower    Patient Active Problem List   Diagnosis Date Noted  . S/P cervical spinal fusion (C5/6 and C6/7) 04/08/2020  . Neuroforaminal stenosis of cervical spine (moderate, Left C3/4, C4/5; R C4/5) 04/08/2020  . Pain in limb 01/27/2018  . 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 Erroneous encounter - disregard 03/29/2017  . Heartburn   . Gastritis without bleeding   . Hx of colonic polyps   . Arthritis 09/02/2016  . Hep C w/o coma, chronic ([place]) 09/02/2016  . Hyperlipidemia, unspecified 09/02/2016  . Hypertension 09/02/2016  . Tardive dyskinesia 09/02/2016  . Urinary incontinence 09/02/2016  . History of  hypothyroidism 08/19/2016  . Neck swelling 08/19/2016  . Acute asthma exacerbation 08/19/2016  . GERD (gastroesophageal reflux disease) 08/09/2016  . Dry mouth 06/01/2016  . Elevated antinuclear antibody (ANA) level 06/01/2016  . Generalized osteoarthritis of hand 06/01/2016  . Degenerative joint disease of right knee 06/01/2016  . 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 Chest pain 04/06/2016  . 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 Sensory ataxia 03/25/2016  . Wrist pain, chronic, left 03/25/2016  . Impairment of balance 02/05/2016  . Acute right-sided low back pain without sciatica 02/05/2016  . Normal blood pressure 01/23/2016  . Pulmonary infiltrates   . Chronic cough   . Pulmonary scarring 01/06/2016  . OAB (overactive bladder) 12/18/2015  . Asthma 09/18/2015  . Multiple pulmonary nodules 09/15/2015  . Baker's cyst of knee, right 07/02/2015  . Bipolar disorder with moderate depression ([place]) 12/19/2014  . Chronic pain 12/19/2014  . Carpal tunnel syndrome of left wrist 10/01/2014  . Chronic RUQ pain 09/19/2014  . Esophageal dysphagia 09/19/2014  . Hemangioma of liver 09/19/2014  . Arthropathy of cervical facet joint 08/06/2014  . Cervical facet joint syndrome 07/12/2014  . Cervical myelopathy with cervical radiculopathy ([place]) 03/24/2014  . Hyponatremia 02/21/2014  . Cervicalgia 09/18/2013  . 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 CARPAL TUNNEL RELEASE Left 02/21/2020   Procedure:  CARPAL TUNNEL RELEASE ENDOSCOPIC;  Surgeon: Corky Mull, MD;  Location: [facility] ORS;  Service: Orthopedics;  Laterality: Left;  . CATARACT EXTRACTION W/ INTRAOCULAR LENS  IMPLANT, BILATERAL    . CERVICAL SPINE SURGERY  2017   [facility] [place]  . COLONOSCOPY WITH PROPOFOL N/A 09/14/2016   Procedure: COLONOSCOPY WITH PROPOFOL;  Surgeon: Lucilla Lame, MD;  Location: [facility] ENDOSCOPY;  Service:  Endoscopy;  Laterality: N/A;  . ESOPHAGOGASTRODUODENOSCOPY (EGD) WITH PROPOFOL N/A 09/14/2016   Procedure: ESOPHAGOGASTRODUODENOSCOPY (EGD) WITH PROPOFOL;  Surgeon: Lucilla Lame, MD;  Location: [facility] ENDOSCOPY;  Service: Endoscopy;  Laterality: N/A;  . FLEXIBLE BRONCHOSCOPY N/A 01/13/2016   Procedure: FLEXIBLE BRONCHOSCOPY;  Surgeon: Vilinda Boehringer, MD;  Location: [facility] ORS;  Service: Cardiopulmonary;  Laterality: N/A;  . JOINT REPLACEMENT    . NEUROPLASTY MAJOR NERVE    . POLYPECTOMY    . THROAT SURGERY    . TOTAL KNEE ARTHROPLASTY Right 06/15/2017   Procedure: TOTAL KNEE ARTHROPLASTY;  Surgeon: Earnestine Leys, MD;  Location: [facility] ORS;  Service: Orthopedics;  Laterality: Right;    OB History   No obstetric history on file.      Home Medications    Prior to Admission medications   Medication Sig Start Date End Date Taking? Authorizing Provider  acetaminophen (TYLENOL) 500 MG tablet Take 500 mg by mouth 2 (two) times daily.   Yes [provider]  albuterol (VENTOLIN HFA) 108 (90 Base) MCG/ACT inhaler INHALE 2 PUFFS EVERY 4 HOURS AS NEEDED FOR WHEEZING FOR SHORTNESS OF BREATH 03/26/20  Yes Tyler Pita, MD  ARIPiprazole (ABILIFY) 5 MG tablet Take 10 mg by mouth daily.   Yes [provider]  buPROPion (WELLBUTRIN XL) 150 MG 24 hr tablet Take 150 mg by mouth daily.   Yes [provider]  diazepam (VALIUM) 2 MG tablet Take 1 mg by mouth at bedtime.    Yes [provider]  etanercept (ENBREL SURECLICK) 50 MG/ML injection  06/25/20  Yes [provider]  gabapentin (NEURONTIN) 300 MG capsule Take 300 mg by mouth 3 (three) times daily.   Yes [provider]  ondansetron (ZOFRAN ODT) 8 MG disintegrating tablet Take 1 tablet (8 mg total) by mouth every 8 (eight) hours as needed for nausea or vomiting. 11/05/20  Yes Margarette Canada, NP  oxybutynin (DITROPAN) 5 MG tablet Take 5 mg by mouth every morning.    Yes [provider]  pantoprazole  (PROTONIX) 40 MG tablet TAKE 1 TABLET (40 MG TOTAL) BY MOUTH EVERY EVENING. 02/21/20 02/20/21 Yes Tyler Pita, MD  phenazopyridine (PYRIDIUM) 200 MG tablet Take 1 tablet (200 mg total) by mouth 3 (three) times daily. 11/05/20  Yes Margarette Canada, NP  rosuvastatin (CRESTOR) 10 MG tablet Take 1 tablet (10 mg total) by mouth at bedtime. 10/03/17  Yes Lada, Satira Anis, MD  sulfamethoxazole-trimethoprim (BACTRIM DS) 800-160 MG tablet Take 1 tablet by mouth 2 (two) times daily for 7 days. 11/05/20 11/12/20 Yes Margarette Canada, NP  SYNJARDY XR 25-1000 MG TB24 Take 1 tablet by mouth daily.  08/30/19  Yes [provider]  traZODone (DESYREL) 100 MG tablet Take 200 mg by mouth at bedtime.   Yes [provider]  montelukast (SINGULAIR) 10 MG tablet Take 1 tablet (10 mg total) by mouth at bedtime for 14 days. 08/17/20 08/31/20  Gertie Baron, NP    Family History Family History  Problem Relation Age of Onset  . Cancer Mother        Esophageal Ca  .  Asthma Brother   . Breast cancer Neg Hx     Social History Social History   Tobacco Use  . Smoking status: Former Smoker    Packs/day: 1.00    Years: 30.00    Pack years: 30.00    Types: E-cigarettes    Quit date: 09/19/2015    Years since quitting: 5.1  . Smokeless tobacco: Never Used  Vaping Use  . Vaping Use: Never used  Substance Use Topics  . Alcohol use: Not Currently    Alcohol/week: 0.0 standard drinks    Comment: 03/31/2012 sobierty   . Drug use: No     Allergies   Codeine, Haldol [haloperidol], Haloperidol lactate, Meloxicam, and Trileptal [oxcarbazepine]   Review of Systems Review of Systems  Constitutional: Negative for activity change, appetite change and fever.  Gastrointestinal: Positive for abdominal distention, abdominal pain, diarrhea and nausea. Negative for blood in stool and vomiting.  Genitourinary: Positive for dysuria, frequency and urgency. Negative for hematuria.  Musculoskeletal: Positive for back  pain.  Hematological: Negative.   Psychiatric/Behavioral: Negative.      Physical Exam Triage Vital Signs ED Triage Vitals  Enc Vitals Group     BP 11/05/20 0859 107/71     Pulse Rate 11/05/20 0859 89     Resp 11/05/20 0859 18     Temp 11/05/20 0859 98.1 F (36.7 C)     Temp Source 11/05/20 0859 Oral     SpO2 11/05/20 0859 97 %     Weight 11/05/20 0857 196 lb (88.9 kg)     Height 11/05/20 0857 5\' 4"  (1.626 m)     Head Circumference --      Peak Flow --      Pain Score 11/05/20 0857 5     Pain Loc --      Pain Edu? --      Excl. in [place]? --    No data found.  Updated Vital Signs BP 107/71 (BP Location: Right Arm)   Pulse 89   Temp 98.1 F (36.7 C) (Oral)   Resp 18   Ht 5\' 4"  (1.626 m)   Wt 196 lb (88.9 kg)   SpO2 97%   BMI 33.64 kg/m   Visual Acuity Right Eye Distance:   Left Eye Distance:   Bilateral Distance:    Right Eye Near:   Left Eye Near:    Bilateral Near:     Physical Exam Vitals and nursing note reviewed.  Constitutional:      General: She is not in acute distress.    Appearance: Normal appearance. She is obese. She is not ill-appearing.  HENT:     Head: Normocephalic and atraumatic.  Cardiovascular:     Rate and Rhythm: Normal rate and regular rhythm.     Pulses: Normal pulses.     Heart sounds: Normal heart sounds. No murmur heard. No gallop.   Pulmonary:     Effort: Pulmonary effort is normal.     Breath sounds: Normal breath sounds. No wheezing, rhonchi or rales.  Abdominal:     General: Bowel sounds are normal. There is distension.     Palpations: Abdomen is soft.     Tenderness: There is abdominal tenderness. There is left CVA tenderness. There is no right CVA tenderness, guarding or rebound.  Skin:    General: Skin is warm and dry.     Capillary Refill: Capillary refill takes less than 2 seconds.     Findings: No erythema or rash.  Neurological:  General: No focal deficit present.     Mental Status: She is alert and oriented  to person, place, and time.  Psychiatric:        Mood and Affect: Mood normal.        Behavior: Behavior normal.        Thought Content: Thought content normal.        Judgment: Judgment normal.      UC Treatments / Results  Labs (all labs ordered are listed, but only abnormal results are displayed) Labs Reviewed  URINALYSIS, COMPLETE (UACMP) WITH MICROSCOPIC - Abnormal; Notable for the following components:      Result Value   APPearance HAZY (*)    Specific Gravity, Urine <1.005 (*)    Glucose, UA 500 (*)    Hgb urine dipstick LARGE (*)    Leukocytes,Ua MODERATE (*)    Bacteria, UA FEW (*)    All other components within normal limits  CBC WITH DIFFERENTIAL/PLATELET - Abnormal; Notable for the following components:   WBC 13.0 (*)    Neutro Abs 9.7 (*)    All other components within normal limits  URINE CULTURE  COMPREHENSIVE METABOLIC PANEL  LIPASE, BLOOD    EKG   Radiology No results found.  Procedures Procedures (including critical care time)  Medications Ordered in UC Medications - No data to display  Initial Impression / Assessment and Plan / UC Course  I have reviewed the triage vital signs and the nursing notes.  Pertinent labs & imaging results that were available during my care of the patient were reviewed by me and considered in my medical decision making (see chart for details).   70 year old female here for evaluation of abdominal pain, nausea, diarrhea, and painful urination that been going on for last 3 days.  Patient reports that she is experiencing gallbladder issues and is due to see the surgeon on Nov 25, 2020 to talk about scheduling a cholecystectomy.  Patient reports that for the last 3 days she has had nausea that is present before and after eating and is associated with abdominal bloating and nausea but no vomiting.  Patient states that she has been having diarrhea but she is not sure that she has any blood in her stools.  After the diarrhea  started she started having painful urination with urinary urgency and frequency.  Patient has a history of type 2 diabetes and is taking Synjardy which does increase her excretion of sugar through her urinary tract making her more prone to UTIs.  Urine collected at triage.  Urinalysis shows 500 glucose and large hemoglobin in her urine.  There are also moderate leukocytes and 21-50 WBCs with few bacteria noted.  No nitrites or protein.  0-5 squamous epithelials.  Will send urine for culture.  Physical exam reveals a benign cardiopulmonary exam.  Patient does have positive left-sided CVA tenderness.  Abdomen is protuberant but soft with mild distention and positive bowel sounds in all 4 quadrants.  Patient is very mildly tender in her right upper quadrant, and suprapubically.  No guarding or rebound noted.  Will check CBC and CMP to ensure there is no signs of systemic infection.  If lab work is fine we will discharge patient home on Bactrim twice daily for 7 days as well as Pyridium for urinary discomfort.  CBC shows a mildly elevated white blood cell count of 13.0 with an increased neutrophil count of 9.7.  H&H is 12.7 and 40.9.  Platelets 266.  CMP is unremarkable.  Lipase is 28.  We will treat patient for UTI and discharged home.  For nausea.  Final Clinical Impressions(s) / UC Diagnoses   Final diagnoses:  Acute cystitis with hematuria     Discharge Instructions     Take the Bactrim twice daily for 7 days with a full glass of water for treatment of urinary tract infection.  Use the Pyridium every 8 hours as needed for urinary discomfort.  This will turn your urine a bright red-orange.  Increase your oral water intake so that you increase your urine production and flush your system.  Use the Zofran every 8 hours as needed for nausea and vomiting.  If you develop an increase in your abdominal pain, fever, nausea and vomiting cannot keep medication down, or chills you need to return  for reevaluation or be seen in the emergency department.    ED Prescriptions    Medication Sig Dispense Auth. Provider   sulfamethoxazole-trimethoprim (BACTRIM DS) 800-160 MG tablet Take 1 tablet by mouth 2 (two) times daily for 7 days. 14 tablet Margarette Canada, NP   phenazopyridine (PYRIDIUM) 200 MG tablet Take 1 tablet (200 mg total) by mouth 3 (three) times daily. 6 tablet Margarette Canada, NP   ondansetron (ZOFRAN ODT) 8 MG disintegrating tablet Take 1 tablet (8 mg total) by mouth every 8 (eight) hours as needed for nausea or vomiting. 20 tablet Margarette Canada, NP     PDMP not reviewed this encounter.   Margarette Canada, NP 11/05/20 1051

## 2020-11-06 ENCOUNTER — Ambulatory Visit: Payer: Medicare Other

## 2020-11-06 ENCOUNTER — Inpatient Hospital Stay: Admission: RE | Admit: 2020-11-06 | Payer: Medicare Other | Source: Ambulatory Visit

## 2020-11-07 ENCOUNTER — Telehealth (HOSPITAL_COMMUNITY): Payer: Self-pay | Admitting: Emergency Medicine

## 2020-11-07 LAB — URINE CULTURE
Culture: >=100000 — AB
Special Requests: NORMAL

## 2020-11-07 MED ORDER — NITROFURANTOIN MONOHYD MACRO 100 MG PO CAPS: 100.0000 mg | ORAL_CAPSULE | Freq: Two times a day (BID) | ORAL | 0 refills | Status: DC

## 2020-12-10 ENCOUNTER — Telehealth: Payer: Self-pay

## 2020-12-10 NOTE — Telephone Encounter (Signed)
Patient states that she needs to save her transportation (only so many covered by insurance) for her visits to her specialist doctors. I told her I would check with Shawn about options for transportation to the scan and someone would let her know.

## 2020-12-10 NOTE — Telephone Encounter (Signed)
Called patient back to let her know that we could work on getting her some transportation to [place] scan. She is scheduled for Thursday June 23 @ 11:00. She is having gallbladder removed 5/27. She quit smoking in 2017. She has [employer] for special needs patients, medicare and medicaid.

## 2020-12-15 ENCOUNTER — Other Ambulatory Visit: Payer: Self-pay | Admitting: *Deleted

## 2020-12-15 DIAGNOSIS — Z122 Encounter for screening for malignant neoplasm of respiratory organs: Secondary | ICD-10-CM

## 2020-12-15 DIAGNOSIS — Z87891 Personal history of nicotine dependence: Secondary | ICD-10-CM

## 2020-12-15 NOTE — Progress Notes (Signed)
Contacted and scheduled for annual lung screening scan. Patient is a former smoker, quit 09/19/15, 30 pack year history.

## 2021-01-06 ENCOUNTER — Telehealth: Payer: Self-pay | Admitting: Nurse Practitioner

## 2021-01-06 NOTE — Telephone Encounter (Signed)
Schedule message requesting transportation for lung screening appointment at [facility].  Appointment scheduled at 10 am for 6/23.  Called patient and explained that she would receive a call on 6/22 to confirm her ride and to tell her when to expect the van/uber on 6/23.  Confirmed.

## 2021-01-14 ENCOUNTER — Telehealth: Payer: Self-pay | Admitting: Nurse Practitioner

## 2021-01-14 NOTE — Telephone Encounter (Signed)
   HALINA ASANO DOB: [birth year] MRN: [id number]   RIDER WAIVER AND RELEASE OF LIABILITY  For purposes of improving physical access to our facilities, [facility] is pleased to partner with third parties to provide [facility] patients or other authorized individuals the option of convenient, on-demand ground transportation services (the [place]") through use of the technology service that enables users to request on-demand ground transportation from independent third-party providers.  By opting to use and accept these [employer], I, the undersigned, hereby agree on behalf of myself, and on behalf of any minor child using the Government social research officer for whom I am the parent or legal guardian, as follows:  Government social research officer provided to me are provided by independent third-party transportation providers who are not [employer] or employees and who are unaffiliated with [employer]. [facility] is neither a transportation carrier nor a common or public carrier. [name] has no control over the quality or safety of the transportation that occurs as a result of the [employer]. [facility] cannot guarantee that any third-party transportation provider will complete any arranged transportation service. [facility] makes no representation, warranty, or guarantee regarding the reliability, timeliness, quality, safety, suitability, or availability of any of the Transport Services or that they will be error free. I fully understand that traveling by vehicle involves risks and dangers of serious bodily injury, including permanent disability, paralysis, and death. I agree, on behalf of myself and on behalf of any minor child using the Transport Services for whom I am the parent or legal guardian, that the entire risk arising out of my use of the [employer] remains solely with me, to the maximum extent permitted under applicable law. The [employer] are provided "as  is" and "as available." [place] disclaims all representations and warranties, express, implied or statutory, not expressly set out in these terms, including the implied warranties of merchantability and fitness for a particular purpose. I hereby waive and release [facility], its agents, employees, officers, directors, representatives, insurers, attorneys, assigns, successors, subsidiaries, and affiliates from any and all past, present, or future claims, demands, liabilities, actions, causes of action, or suits of any kind directly or indirectly arising from acceptance and use of the [employer]. I further waive and release [facility] and its affiliates from all present and future liability and responsibility for any injury or death to persons or damages to property caused by or related to the use of the [employer]. I have read this Waiver and Release of Liability, and I understand the terms used in it and their legal significance. This Waiver is freely and voluntarily given with the understanding that my right (as well as the right of any minor child for whom I am the parent or legal guardian using the [employer]) to legal recourse against [facility] in connection with the [employer] is knowingly surrendered in return for use of these services.   I attest that I read the consent document to Deatra Robinson, gave Ms. Hazelbaker the opportunity to ask questions and answered the questions asked (if any). I affirm that Deatra Robinson then provided consent for she's participation in this program.     Katy Apo

## 2021-01-15 ENCOUNTER — Ambulatory Visit: Admission: RE | Admit: 2021-01-15 | Payer: Medicare Other | Source: Ambulatory Visit

## 2021-03-29 ENCOUNTER — Encounter: Payer: Self-pay | Admitting: Emergency Medicine

## 2021-03-29 ENCOUNTER — Ambulatory Visit
Admission: EM | Admit: 2021-03-29 | Discharge: 2021-03-29 | Disposition: A | Discharge status: Home / Self Care | Payer: Medicare Other | Attending: Physician Assistant | Admitting: Physician Assistant

## 2021-03-29 ENCOUNTER — Other Ambulatory Visit: Payer: Self-pay

## 2021-03-29 ENCOUNTER — Ambulatory Visit (INDEPENDENT_AMBULATORY_CARE_PROVIDER_SITE_OTHER): Payer: Medicare Other

## 2021-03-29 DIAGNOSIS — R0602 Shortness of breath: Secondary | ICD-10-CM | POA: Diagnosis present

## 2021-03-29 DIAGNOSIS — R0600 Dyspnea, unspecified: Secondary | ICD-10-CM | POA: Diagnosis not present

## 2021-03-29 DIAGNOSIS — J45901 Unspecified asthma with (acute) exacerbation: Secondary | ICD-10-CM | POA: Diagnosis present

## 2021-03-29 DIAGNOSIS — Z20822 Contact with and (suspected) exposure to covid-19: Secondary | ICD-10-CM | POA: Diagnosis not present

## 2021-03-29 MED ORDER — PREDNISONE 20 MG PO TABS: 40.0000 mg | ORAL_TABLET | Freq: Every day | ORAL | 0 refills | Status: AC

## 2021-03-29 NOTE — ED Triage Notes (Signed)
Patient states that she has history of asthma.  Patient states that she has been SOB and has been using her inhalers and states that the inhalers are not helping.  Patient denies coug or cold symptoms.

## 2021-03-29 NOTE — ED Provider Notes (Signed)
[facility]    CSN: [id number] Arrival date & time: 03/29/21  0917      History   Chief Complaint Chief Complaint  Patient presents with   Shortness of Breath   Asthma    HPI Kathryn Ramirez is a 70 y.o. female presenting for onset of shortness of breath and wheezing as well as chest tightness yesterday.  Patient does have history of asthma.  States she has used her asthma inhalers seem to be helping her shortness of breath.  She admits to asthma exacerbations every several months.  Patient has needed oral corticosteroids in the past.  She says she last took oral corticosteroids early this year.  Patient denies any fevers, fatigue, body aches, cough, congestion, sore throat, nausea/vomiting or diarrhea.  No leg swelling.  No known exposure to COVID-19 and vaccinated for COVID-19 x3.  She does have history of asthma, bipolar 1 disorder, COPD, diabetes, hypertension, psoriasis and psoriatic arthritis, rheumatoid arthritis.  No other complaints or concerns.  HPI  Past Medical History:  Diagnosis Date   Acid reflux disease    Anemia    Asthma    Bipolar 1 disorder ([place])    COPD (chronic obstructive pulmonary disease) ([place])    Diabetes mellitus without complication ([place])    History of hepatitis C 2010   took interferon x 1 year   Hypertension    Liver hemangioma    Lung nodule    Psoriasis    Psoriatic arthritis ([place])    Rheumatoid arthritis ([place])    Tardive dyskinesia    Wears dentures    full upper and lower    Patient Active Problem List   Diagnosis Date Noted   S/P cervical spinal fusion (C5/6 and C6/7) 04/08/2020   Neuroforaminal stenosis of cervical spine (moderate, Left C3/4, C4/5; R C4/5) 04/08/2020   Pain in limb 01/27/2018   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   Erroneous encounter - disregard 03/29/2017   Heartburn    Gastritis without bleeding    Hx of colonic polyps    Arthritis 09/02/2016   Hep C w/o coma, chronic ([place]) 09/02/2016    Hyperlipidemia, unspecified 09/02/2016   Hypertension 09/02/2016   Tardive dyskinesia 09/02/2016   Urinary incontinence 09/02/2016   History of hypothyroidism 08/19/2016   Neck swelling 08/19/2016   Acute asthma exacerbation 08/19/2016   GERD (gastroesophageal reflux disease) 08/09/2016   Dry mouth 06/01/2016   Elevated antinuclear antibody (ANA) level 06/01/2016   Generalized osteoarthritis of hand 06/01/2016   Degenerative joint disease of right knee 06/01/2016   Vocal cord polyp 05/17/2016   Chest pain 04/06/2016   Drug-induced peripheral neuropathy ([place]) 03/25/2016   Sensory ataxia 03/25/2016   Wrist pain, chronic, left 03/25/2016   Impairment of balance 02/05/2016   Acute right-sided low back pain without sciatica 02/05/2016   Normal blood pressure 01/23/2016   Pulmonary infiltrates    Chronic cough    Pulmonary scarring 01/06/2016   OAB (overactive bladder) 12/18/2015   Asthma 09/18/2015   Multiple pulmonary nodules 09/15/2015   Baker's cyst of knee, right 07/02/2015   Bipolar disorder with moderate depression ([place]) 12/19/2014   Chronic pain 12/19/2014   Carpal tunnel syndrome of left wrist 10/01/2014   Chronic RUQ pain 09/19/2014   Esophageal dysphagia 09/19/2014   Hemangioma of liver 09/19/2014   Arthropathy of cervical facet joint 08/06/2014   Cervical facet joint syndrome 07/12/2014   Cervical myelopathy with cervical radiculopathy ([place]) 03/24/2014   Hyponatremia 02/21/2014  Cervicalgia 09/18/2013   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 09/18/2013    Past Surgical History:  Procedure Laterality Date   CARPAL TUNNEL RELEASE Left 02/21/2020   Procedure: CARPAL TUNNEL RELEASE ENDOSCOPIC;  Surgeon: Corky Mull, MD;  Location: [facility];  Service: Orthopedics;  Laterality: Left;   CATARACT EXTRACTION W/ INTRAOCULAR LENS  IMPLANT, BILATERAL     CERVICAL SPINE SURGERY  2017   [facility]   CHOLECYSTECTOMY     COLONOSCOPY WITH  PROPOFOL N/A 09/14/2016   Procedure: COLONOSCOPY WITH PROPOFOL;  Surgeon: Lucilla Lame, MD;  Location: [facility];  Service: Endoscopy;  Laterality: N/A;   ESOPHAGOGASTRODUODENOSCOPY (EGD) WITH PROPOFOL N/A 09/14/2016   Procedure: ESOPHAGOGASTRODUODENOSCOPY (EGD) WITH PROPOFOL;  Surgeon: Lucilla Lame, MD;  Location: [facility] END[facility];  Service: Endoscopy;  Laterality: N/A;   FLEXIBLE BRONCHOSCOPY N/A 01/13/2016   Procedure: FLEXIBLE BRONCHOSCOPY;  Surgeon: Vilinda Boehringer, MD;  Location: [facility];  Service: Cardiopulmonary;  Laterality: N/A;   JOINT REPLACEMENT     NEUROPLASTY MAJOR NERVE     POLYPECTOMY     THROAT SURGERY     TOTAL KNEE ARTHROPLASTY Right 06/15/2017   Procedure: TOTAL KNEE ARTHROPLASTY;  Surgeon: Earnestine Leys, MD;  Location: [facility];  Service: Orthopedics;  Laterality: Right;    OB History   No obstetric history on file.      Home Medications    Prior to Admission medications   Medication Sig Start Date End Date Taking? Authorizing Provider  albuterol (VENTOLIN HFA) 108 (90 Base) MCG/ACT inhaler INHALE 2 PUFFS EVERY 4 HOURS AS NEEDED FOR WHEEZING FOR SHORTNESS OF BREATH 03/26/20  Yes Tyler Pita, MD  ARIPiprazole (ABILIFY) 5 MG tablet Take 10 mg by mouth daily.   Yes [provider]  buPROPion (WELLBUTRIN XL) 150 MG 24 hr tablet Take 150 mg by mouth daily.   Yes [provider]  diazepam (VALIUM) 2 MG tablet Take 1 mg by mouth at bedtime.    Yes [provider]  gabapentin (NEURONTIN) 300 MG capsule Take 300 mg by mouth 3 (three) times daily.   Yes [provider]  montelukast (SINGULAIR) 10 MG tablet Take 1 tablet (10 mg total) by mouth at bedtime for 14 days. 08/17/20 03/29/21 Yes Gertie Baron, NP  oxybutynin (DITROPAN) 5 MG tablet Take 5 mg by mouth every morning.    Yes [provider]  pantoprazole (PROTONIX) 40 MG tablet TAKE 1 TABLET (40 MG TOTAL) BY MOUTH EVERY EVENING. 02/21/20 03/29/21 Yes Tyler Pita,  MD  predniSONE (DELTASONE) 20 MG tablet Take 2 tablets (40 mg total) by mouth daily for 5 days. 03/29/21 04/03/21 Yes Laurene Footman B, PA-C  rosuvastatin (CRESTOR) 10 MG tablet Take 1 tablet (10 mg total) by mouth at bedtime. 10/03/17  Yes Lada, Satira Anis, MD  SYMBICORT 160-4.5 MCG/ACT inhaler Inhale into the lungs. 03/26/21  Yes [provider]  SYNJARDY XR 25-1000 MG TB24 Take 1 tablet by mouth daily.  08/30/19  Yes [provider]  traZODone (DESYREL) 100 MG tablet Take 200 mg by mouth at bedtime.   Yes [provider]  acetaminophen (TYLENOL) 500 MG tablet Take 500 mg by mouth 2 (two) times daily.    [provider]  etanercept (ENBREL SURECLICK) 50 MG/ML injection  06/25/20   [provider]  ondansetron (ZOFRAN ODT) 8 MG disintegrating tablet Take 1 tablet (8 mg total) by mouth every 8 (eight) hours as needed for nausea or vomiting. 11/05/20  Margarette Canada, NP  phenazopyridine (PYRIDIUM) 200 MG tablet Take 1 tablet (200 mg total) by mouth 3 (three) times daily. 11/05/20   Margarette Canada, NP    Family History Family History  Problem Relation Age of Onset   Cancer Mother        Esophageal Ca   Asthma Brother    Breast cancer Neg Hx     Social History Social History   Tobacco Use   Smoking status: Former    Packs/day: 1.00    Years: 30.00    Pack years: 30.00    Types: E-cigarettes, Cigarettes    Quit date: 09/19/2015    Years since quitting: 5.5   Smokeless tobacco: Never  Vaping Use   Vaping Use: Never used  Substance Use Topics   Alcohol use: Not Currently    Alcohol/week: 0.0 standard drinks    Comment: 03/31/2012 sobierty    Drug use: No     Allergies   Codeine, Haldol [haloperidol], Haloperidol lactate, Meloxicam, and Trileptal [oxcarbazepine]   Review of Systems Review of Systems  Constitutional:  Negative for chills, diaphoresis, fatigue and fever.  HENT:  Negative for congestion, ear pain, rhinorrhea, sinus pressure, sinus  pain and sore throat.   Respiratory:  Positive for chest tightness, shortness of breath and wheezing. Negative for cough.   Cardiovascular:  Negative for chest pain, palpitations and leg swelling.  Gastrointestinal:  Negative for abdominal pain, nausea and vomiting.  Musculoskeletal:  Negative for arthralgias and myalgias.  Skin:  Negative for rash.  Neurological:  Negative for weakness and headaches.  Hematological:  Negative for adenopathy.    Physical Exam Triage Vital Signs ED Triage Vitals  Enc Vitals Group     BP 03/29/21 0941 134/84     Pulse Rate 03/29/21 0941 68     Resp 03/29/21 0941 14     Temp 03/29/21 0941 98.2 F (36.8 C)     Temp Source 03/29/21 0941 Oral     SpO2 03/29/21 0941 95 %     Weight 03/29/21 0935 193 lb (87.5 kg)     Height 03/29/21 0935 '5\' 3"'$  (1.6 m)     Head Circumference --      Peak Flow --      Pain Score 03/29/21 0935 0     Pain Loc --      Pain Edu? --      Excl. in [place]? --    No data found.  Updated Vital Signs BP 134/84 (BP Location: Left Arm)   Pulse 68   Temp 98.2 F (36.8 C) (Oral)   Resp 14   Ht '5\' 3"'$  (1.6 m)   Wt 193 lb (87.5 kg)   SpO2 95%   BMI 34.19 kg/m      Physical Exam Vitals and nursing note reviewed.  Constitutional:      General: She is not in acute distress.    Appearance: Normal appearance. She is not ill-appearing or toxic-appearing.  HENT:     Head: Normocephalic and atraumatic.     Nose: Nose normal.     Mouth/Throat:     Mouth: Mucous membranes are moist.     Pharynx: Oropharynx is clear.  Eyes:     General: No scleral icterus.       Right eye: No discharge.        Left eye: No discharge.     Conjunctiva/sclera: Conjunctivae normal.  Cardiovascular:     Rate and Rhythm: Normal rate and regular rhythm.  Heart sounds: Normal heart sounds.  Pulmonary:     Effort: Pulmonary effort is normal. No respiratory distress.     Breath sounds: Wheezing (minimal scattered wheezes) present.  Musculoskeletal:      Cervical back: Neck supple.     Right lower leg: No edema.     Left lower leg: No edema.  Skin:    General: Skin is dry.  Neurological:     General: No focal deficit present.     Mental Status: She is alert. Mental status is at baseline.     Motor: No weakness.     Gait: Gait normal.  Psychiatric:        Mood and Affect: Mood normal.        Behavior: Behavior normal.        Thought Content: Thought content normal.     UC Treatments / Results  Labs (all labs ordered are listed, but only abnormal results are displayed) Labs Reviewed  SARS CORONAVIRUS 2 (TAT 6-24 HRS)    EKG   Radiology DG Chest 2 View  Result Date: 03/29/2021 CLINICAL DATA:  Worsening dyspnea for 2 days EXAM: CHEST - 2 VIEW COMPARISON:  08/17/2020 chest radiograph. FINDINGS: Partially visualized surgical hardware from ACDF in the lower cervical spine. Stable cardiomediastinal silhouette with normal heart size. No pneumothorax. No pleural effusion. No pulmonary edema. No acute consolidative airspace disease. Stable minimal linear scarring at the left costophrenic angle. IMPRESSION: No active cardiopulmonary disease. Electronically Signed   By: Ilona Sorrel M.D.   On: 03/29/2021 10:15    Procedures Procedures (including critical care time)  Medications Ordered in UC Medications - No data to display  Initial Impression / Assessment and Plan / UC Course  I have reviewed the triage vital signs and the nursing notes.  Pertinent labs & imaging results that were available during my care of the patient were reviewed by me and considered in my medical decision making (see chart for details).  70 year old female presenting for shortness of breath, chest tightness and wheezing starting yesterday.  Has history of asthma and COPD.  Patient has been using her asthma inhalers and still continues to feel short of breath.  Admits to feeling more short of breath with her mask on.  Vitals are normal and stable.  She is  afebrile.  Oxygen is 95%.  She is in no respiratory distress.  She has minimal wheezes noted.  No leg swelling.  Heart regular rate and rhythm.  PCR COVID test obtained.  Current CDC guidelines, isolation protocol and ED precautions reviewed with patient.  If patient is positive for COVID-19 she would be a candidate for antiviral therapy.  Patient having mild asthma exacerbation.  At this time I have advised her to continue with her asthma inhalers.  I have sent in prednisone to help as well.  Advised to call 911 or go to ED for any acute worsening of breathing or if not feeling better in the next couple of days.  She should also go for any worsening chest tightness or if she feels weak.  Final Clinical Impressions(s) / UC Diagnoses   Final diagnoses:  Exacerbation of asthma, unspecified asthma severity, unspecified whether persistent  Shortness of breath     Discharge Instructions      SHORTNESS OF BREATH: Your x-ray is clear.  You appear to be having a mild asthma exacerbation.  I have sent in some prednisone for you.  We also performed a COVID test.  Results will  be back tomorrow and we will contact you if you are positive.  Keep using your asthma inhalers.  If you feel that you are not improving in next couple of days or if symptoms worsen you should go to the emergency department.  You have received COVID testing today either for positive exposure, concerning symptoms that could be related to COVID infection, screening purposes, or re-testing after confirmed positive.  Your test obtained today checks for active viral infection in the last 1-2 weeks. If your test is negative now, you can still test positive later. So, if you do develop symptoms you should either get re-tested and/or isolate x 5 days and then strict mask use x 5 days (unvaccinated) or mask use x 10 days (vaccinated). Please follow CDC guidelines.  While Rapid antigen tests come back in 15-20 minutes, send out PCR/molecular  test results typically come back within 1-3 days. In the mean time, if you are symptomatic, assume this could be a positive test and treat/monitor yourself as if you do have COVID.   We will call with test results if positive. Please download the MyChart app and set up a profile to access test results.   If symptomatic, go home and rest. Push fluids. Take Tylenol as needed for discomfort. Gargle warm salt water. Throat lozenges. Take Mucinex DM or Robitussin for cough. Humidifier in bedroom to ease coughing. Warm showers. Also review the COVID handout for more information.  COVID-19 INFECTION: The incubation period of COVID-19 is approximately 14 days after exposure, with most symptoms developing in roughly 4-5 days. Symptoms may range in severity from mild to critically severe. Roughly 80% of those infected will have mild symptoms. People of any age may become infected with COVID-19 and have the ability to transmit the virus. The most common symptoms include: fever, fatigue, cough, body aches, headaches, sore throat, nasal congestion, shortness of breath, nausea, vomiting, diarrhea, changes in smell and/or taste.    COURSE OF ILLNESS Some patients may begin with mild disease which can progress quickly into critical symptoms. If your symptoms are worsening please call ahead to the Emergency Department and proceed there for further treatment. Recovery time appears to be roughly 1-2 weeks for mild symptoms and 3-6 weeks for severe disease.   GO IMMEDIATELY TO ER FOR FEVER YOU ARE UNABLE TO GET DOWN WITH TYLENOL, BREATHING PROBLEMS, CHEST PAIN, FATIGUE, LETHARGY, INABILITY TO EAT OR DRINK, ETC  QUARANTINE AND ISOLATION: To help decrease the spread of COVID-19 please remain isolated if you have COVID infection or are highly suspected to have COVID infection. This means -stay home and isolate to one room in the home if you live with others. Do not share a bed or bathroom with others while ill, sanitize and  wipe down all countertops and keep common areas clean and disinfected. Stay home for 5 days. If you have no symptoms or your symptoms are resolving after 5 days, you can leave your house. Continue to wear a mask around others for 5 additional days. If you have been in close contact (within 6 feet) of someone diagnosed with COVID 19, you are advised to quarantine in your home for 14 days as symptoms can develop anywhere from 2-14 days after exposure to the virus. If you develop symptoms, you  must isolate.  Most current guidelines for COVID after exposure -unvaccinated: isolate 5 days and strict mask use x 5 days. Test on day 5 is possible -vaccinated: wear mask x 10 days if symptoms do  not develop -You do not necessarily need to be tested for COVID if you have + exposure and  develop symptoms. Just isolate at home x10 days from symptom onset During this global pandemic, CDC advises to practice social distancing, try to stay at least 22f away from others at all times. Wear a face covering. Wash and sanitize your hands regularly and avoid going anywhere that is not necessary.  KEEP IN MIND THAT THE COVID TEST IS NOT 100% ACCURATE AND YOU SHOULD STILL DO EVERYTHING TO PREVENT POTENTIAL SPREAD OF VIRUS TO OTHERS (WEAR MASK, WEAR GLOVES, WRosholtHANDS AND SANITIZE REGULARLY). IF INITIAL TEST IS NEGATIVE, THIS MAY NOT MEAN YOU ARE DEFINITELY NEGATIVE. MOST ACCURATE TESTING IS DONE 5-7 DAYS AFTER EXPOSURE.   It is not advised by CDC to get re-tested after receiving a positive COVID test since you can still test positive for weeks to months after you have already cleared the virus.   *If you have not been vaccinated for COVID, I strongly suggest you consider getting vaccinated as long as there are no contraindications.       ED Prescriptions     Medication Sig Dispense Auth. Provider   predniSONE (DELTASONE) 20 MG tablet Take 2 tablets (40 mg total) by mouth daily for 5 days. 10 tablet EGretta Cool     PDMP not reviewed this encounter.   EDanton Clap PA-C 03/29/21 1036

## 2021-03-29 NOTE — Discharge Instructions (Addendum)
SHORTNESS OF BREATH: Your x-ray is clear.  You appear to be having a mild asthma exacerbation.  I have sent in some prednisone for you.  We also performed a COVID test.  Results will be back tomorrow and we will contact you if you are positive.  Keep using your asthma inhalers.  If you feel that you are not improving in next couple of days or if symptoms worsen you should go to the emergency department.  You have received COVID testing today either for positive exposure, concerning symptoms that could be related to COVID infection, screening purposes, or re-testing after confirmed positive.  Your test obtained today checks for active viral infection in the last 1-2 weeks. If your test is negative now, you can still test positive later. So, if you do develop symptoms you should either get re-tested and/or isolate x 5 days and then strict mask use x 5 days (unvaccinated) or mask use x 10 days (vaccinated). Please follow CDC guidelines.  While Rapid antigen tests come back in 15-20 minutes, send out PCR/molecular test results typically come back within 1-3 days. In the mean time, if you are symptomatic, assume this could be a positive test and treat/monitor yourself as if you do have COVID.   We will call with test results if positive. Please download the MyChart app and set up a profile to access test results.   If symptomatic, go home and rest. Push fluids. Take Tylenol as needed for discomfort. Gargle warm salt water. Throat lozenges. Take Mucinex DM or Robitussin for cough. Humidifier in bedroom to ease coughing. Warm showers. Also review the COVID handout for more information.  COVID-19 INFECTION: The incubation period of COVID-19 is approximately 14 days after exposure, with most symptoms developing in roughly 4-5 days. Symptoms may range in severity from mild to critically severe. Roughly 80% of those infected will have mild symptoms. People of any age may become infected with COVID-19 and have the  ability to transmit the virus. The most common symptoms include: fever, fatigue, cough, body aches, headaches, sore throat, nasal congestion, shortness of breath, nausea, vomiting, diarrhea, changes in smell and/or taste.    COURSE OF ILLNESS Some patients may begin with mild disease which can progress quickly into critical symptoms. If your symptoms are worsening please call ahead to the Emergency Department and proceed there for further treatment. Recovery time appears to be roughly 1-2 weeks for mild symptoms and 3-6 weeks for severe disease.   GO IMMEDIATELY TO ER FOR FEVER YOU ARE UNABLE TO GET DOWN WITH TYLENOL, BREATHING PROBLEMS, CHEST PAIN, FATIGUE, LETHARGY, INABILITY TO EAT OR DRINK, ETC  QUARANTINE AND ISOLATION: To help decrease the spread of COVID-19 please remain isolated if you have COVID infection or are highly suspected to have COVID infection. This means -stay home and isolate to one room in the home if you live with others. Do not share a bed or bathroom with others while ill, sanitize and wipe down all countertops and keep common areas clean and disinfected. Stay home for 5 days. If you have no symptoms or your symptoms are resolving after 5 days, you can leave your house. Continue to wear a mask around others for 5 additional days. If you have been in close contact (within 6 feet) of someone diagnosed with COVID 19, you are advised to quarantine in your home for 14 days as symptoms can develop anywhere from 2-14 days after exposure to the virus. If you develop symptoms, you  must  isolate.  Most current guidelines for COVID after exposure -unvaccinated: isolate 5 days and strict mask use x 5 days. Test on day 5 is possible -vaccinated: wear mask x 10 days if symptoms do not develop -You do not necessarily need to be tested for COVID if you have + exposure and  develop symptoms. Just isolate at home x10 days from symptom onset During this global pandemic, CDC advises to practice  social distancing, try to stay at least 35f away from others at all times. Wear a face covering. Wash and sanitize your hands regularly and avoid going anywhere that is not necessary.  KEEP IN MIND THAT THE COVID TEST IS NOT 100% ACCURATE AND YOU SHOULD STILL DO EVERYTHING TO PREVENT POTENTIAL SPREAD OF VIRUS TO OTHERS (WEAR MASK, WEAR GLOVES, WGodleyHANDS AND SANITIZE REGULARLY). IF INITIAL TEST IS NEGATIVE, THIS MAY NOT MEAN YOU ARE DEFINITELY NEGATIVE. MOST ACCURATE TESTING IS DONE 5-7 DAYS AFTER EXPOSURE.   It is not advised by CDC to get re-tested after receiving a positive COVID test since you can still test positive for weeks to months after you have already cleared the virus.   *If you have not been vaccinated for COVID, I strongly suggest you consider getting vaccinated as long as there are no contraindications.

## 2021-03-30 LAB — SARS CORONAVIRUS 2 (TAT 6-24 HRS): SARS Coronavirus 2: NEGATIVE

## 2021-06-09 ENCOUNTER — Ambulatory Visit: Payer: Medicare Other | Admitting: Student in an Organized Health Care Education/Training Program

## 2021-10-01 IMAGING — US US EXTREM LOW VENOUS*R*
1 series · 14 of 24 positions shown · non-contrast
Comparison: None.

CLINICAL DATA: Concern for DVT.

EXAM:
RIGHT LOWER EXTREMITY VENOUS DOPPLER ULTRASOUND
TECHNIQUE: Gray-scale sonography with compression, as well as color and duplex
ultrasound, were performed to evaluate the deep venous system(s)
from the level of the common femoral vein through the popliteal and
proximal calf veins.

[Series 1: lev · 14 of 34 slices shown]
[im 1/34]
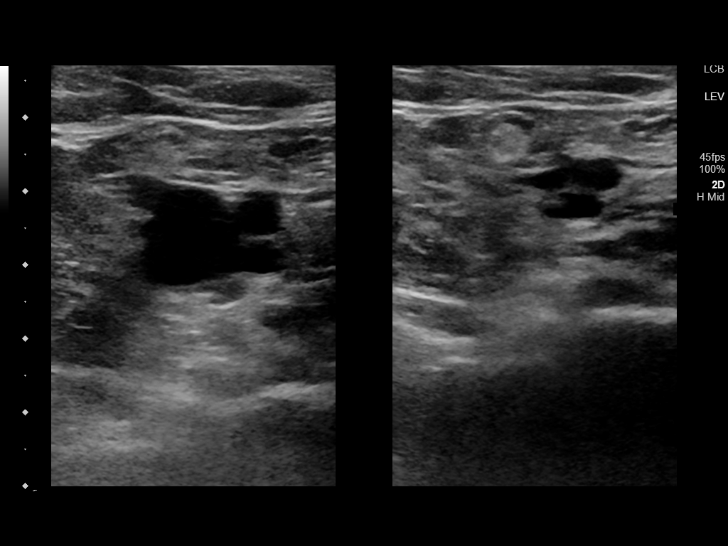
[im 3/34]
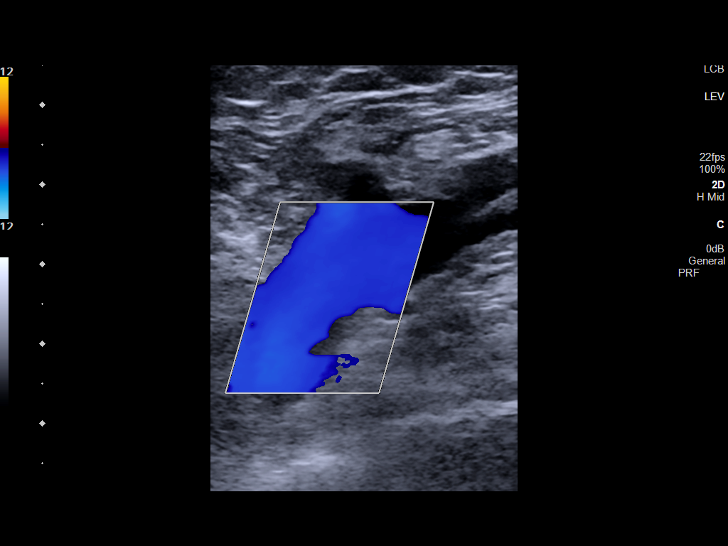
[im 6/34]
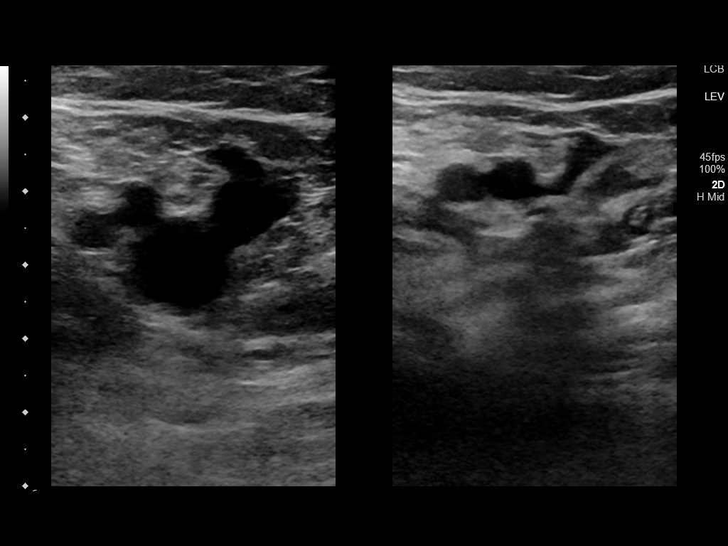
[im 9/34]
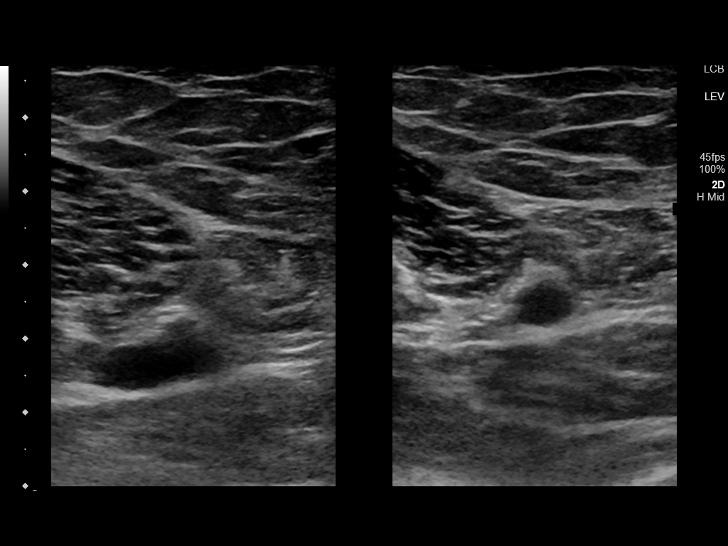
[im 11/34]
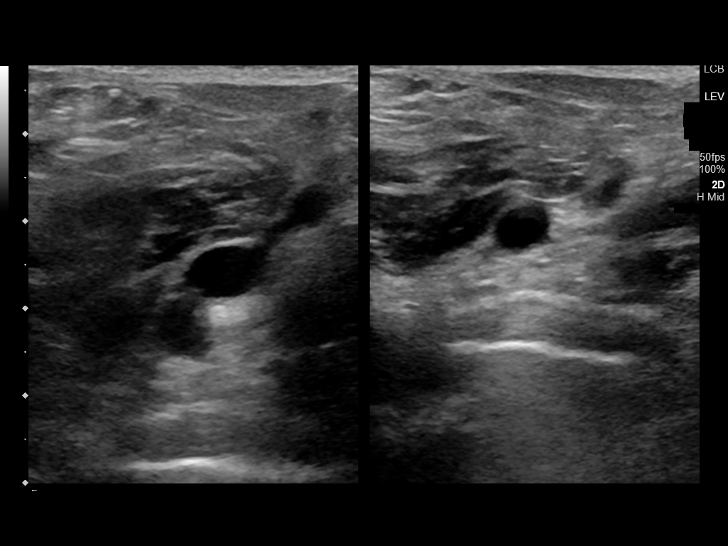
[im 13/34]
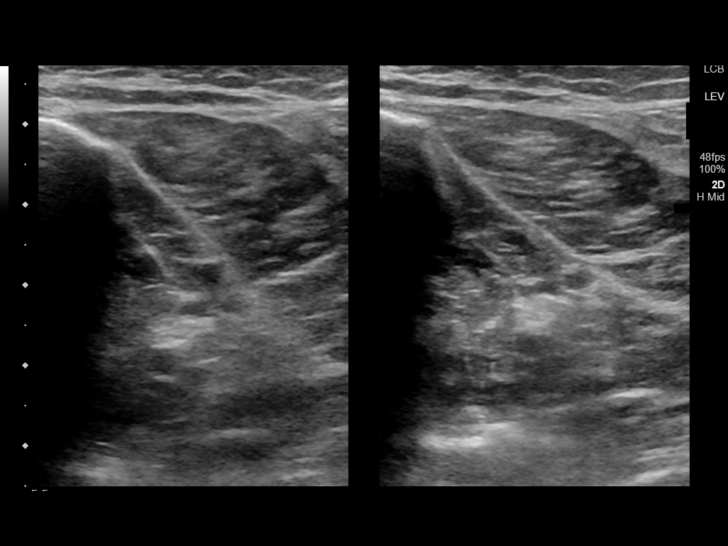
[im 16/34]
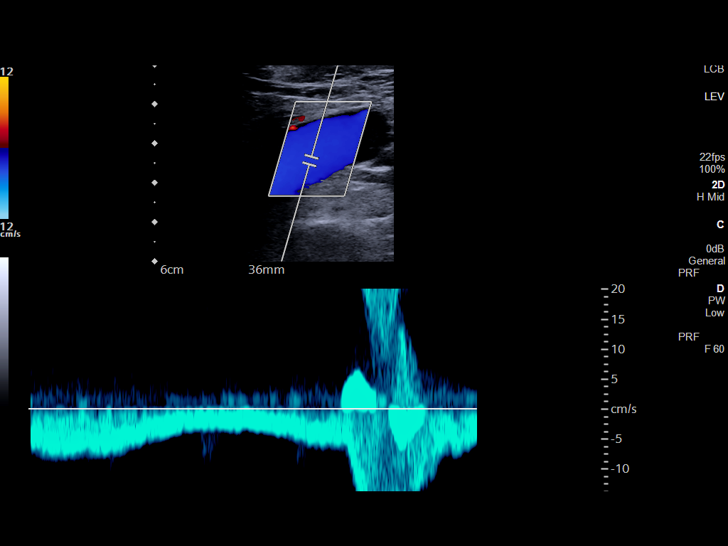
[im 18/34]
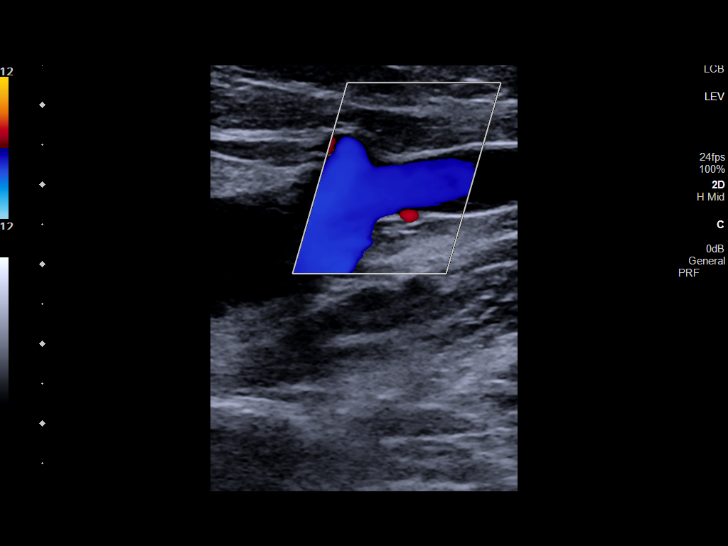
[im 21/34]
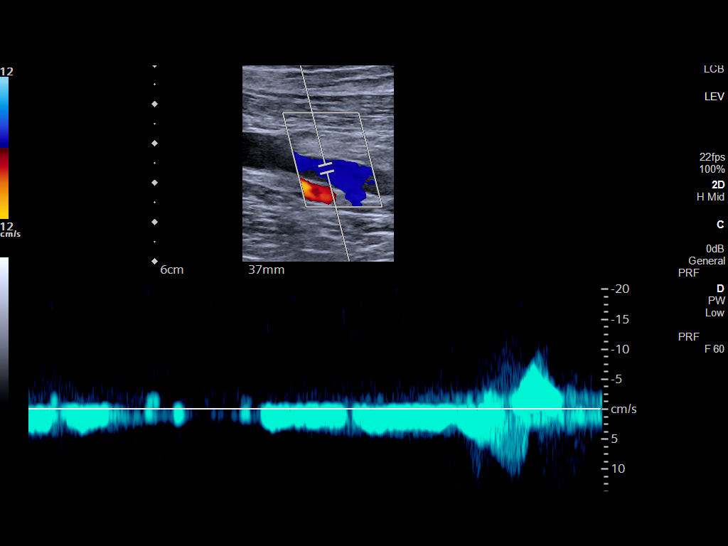
[im 23/34]
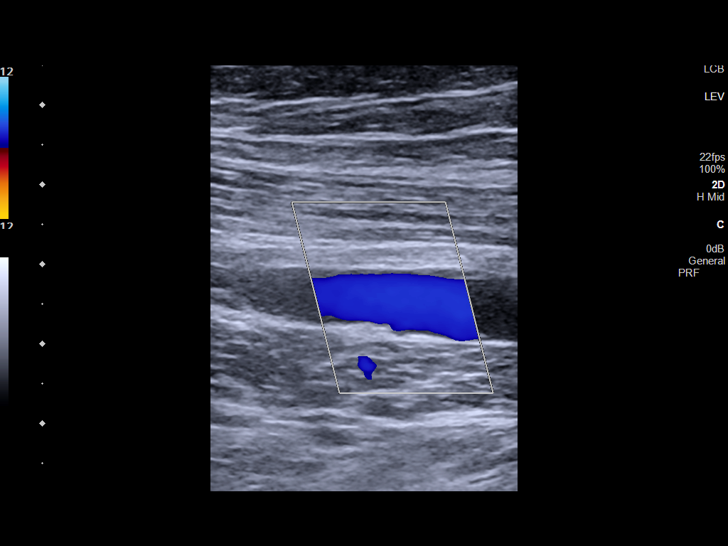
[im 26/34]
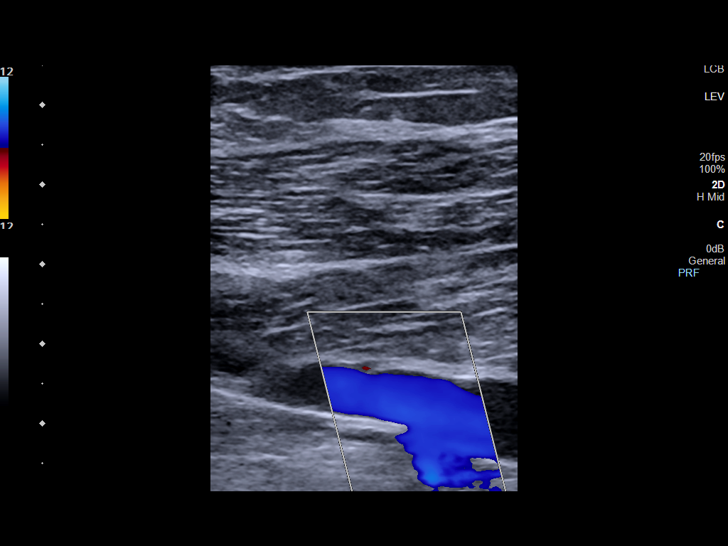
[im 28/34]
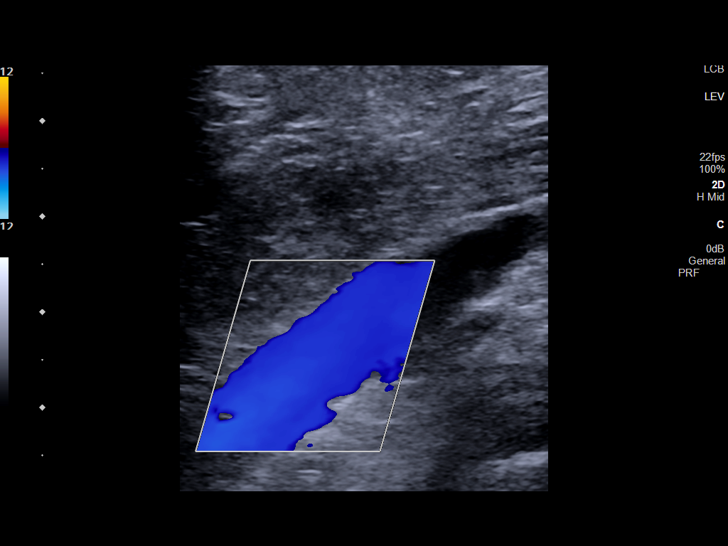
[im 31/34]
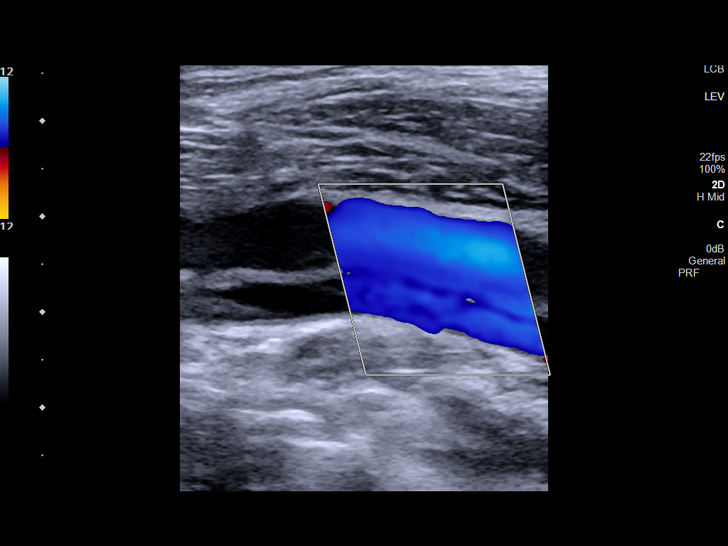
[im 34/34]
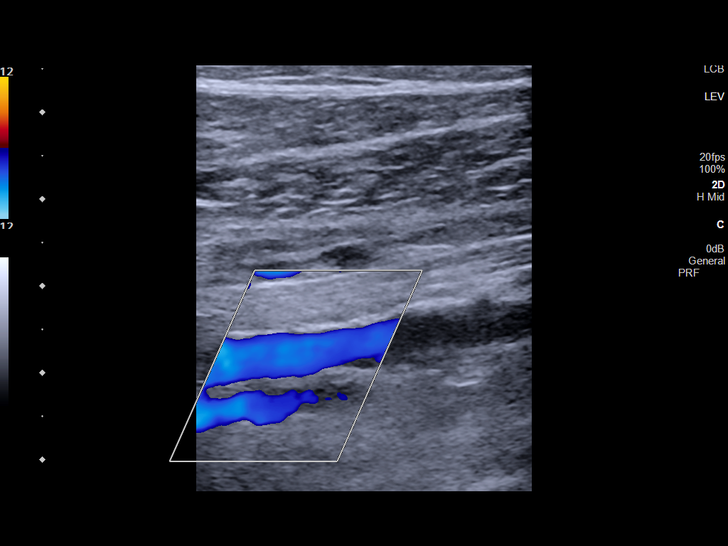

[14 of 24 positions shown; findings below may reference images not displayed]

FINDINGS: VENOUS

Normal compressibility of the common femoral, superficial femoral,
and popliteal veins, as well as the visualized calf veins.
Visualized portions of profunda femoral vein and great saphenous
vein unremarkable. No filling defects to suggest DVT on grayscale or
color Doppler imaging. Doppler waveforms show normal direction of
venous flow, normal respiratory plasticity and response to
augmentation.

Limited views of the contralateral common femoral vein are
unremarkable.

OTHER

None.

Limitations: none
IMPRESSION: Negative.

## 2021-10-01 IMAGING — CR DG KNEE COMPLETE 4+V*R*
1 series · 4 of 4 positions shown · non-contrast
Comparison: 12/28/2017

CLINICAL DATA: Right knee pain

EXAM:
RIGHT KNEE - COMPLETE 4+ VIEW

[Series 1: dg knee complete 4 views right · 0.14mm/px · 4 of 4 slices shown]
[im 1/4]
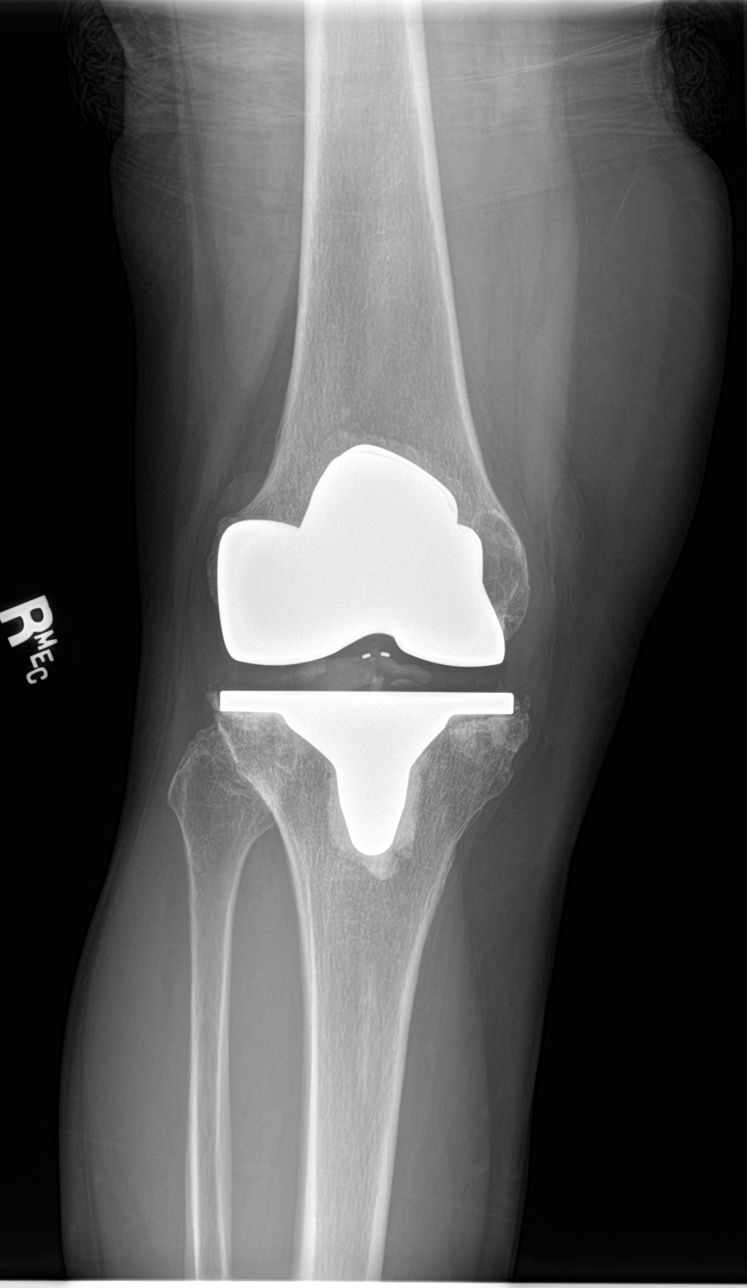
[im 2/4]
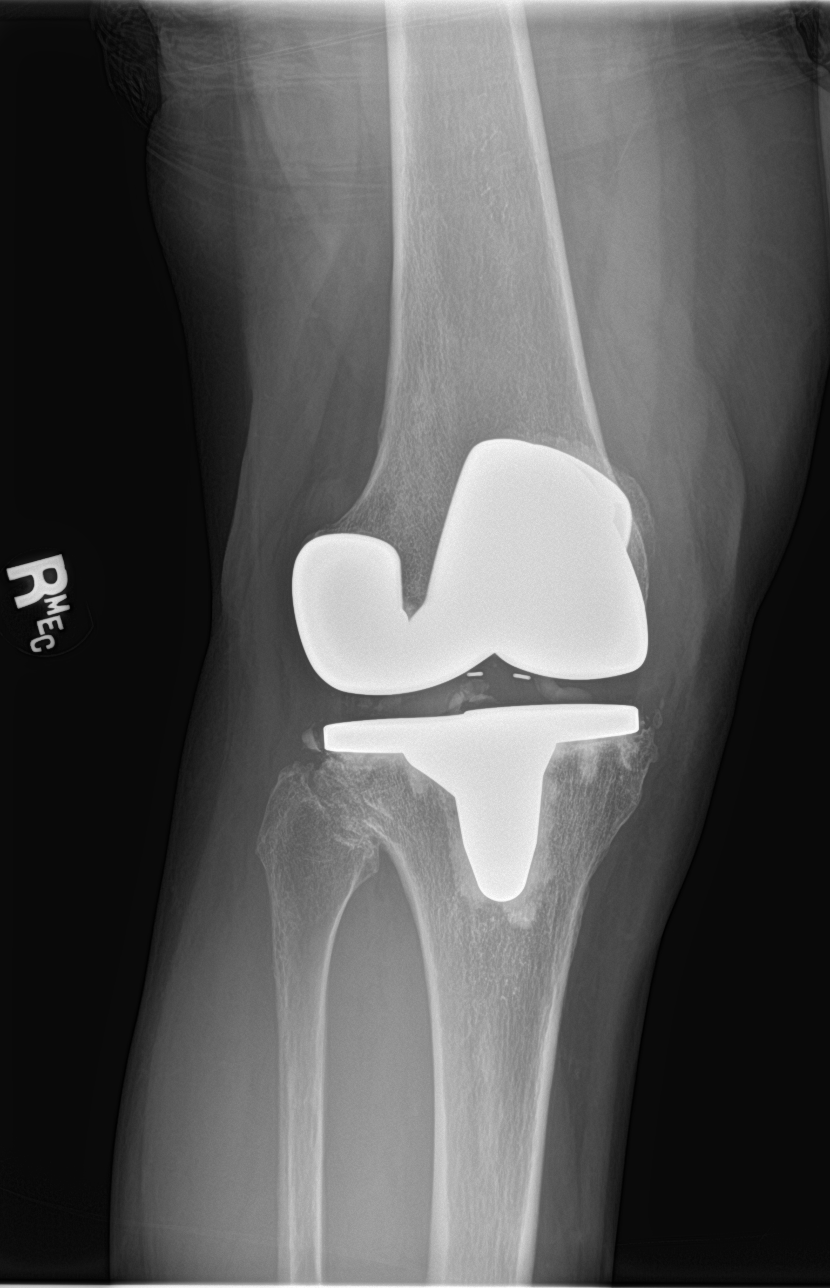
[im 3/4]
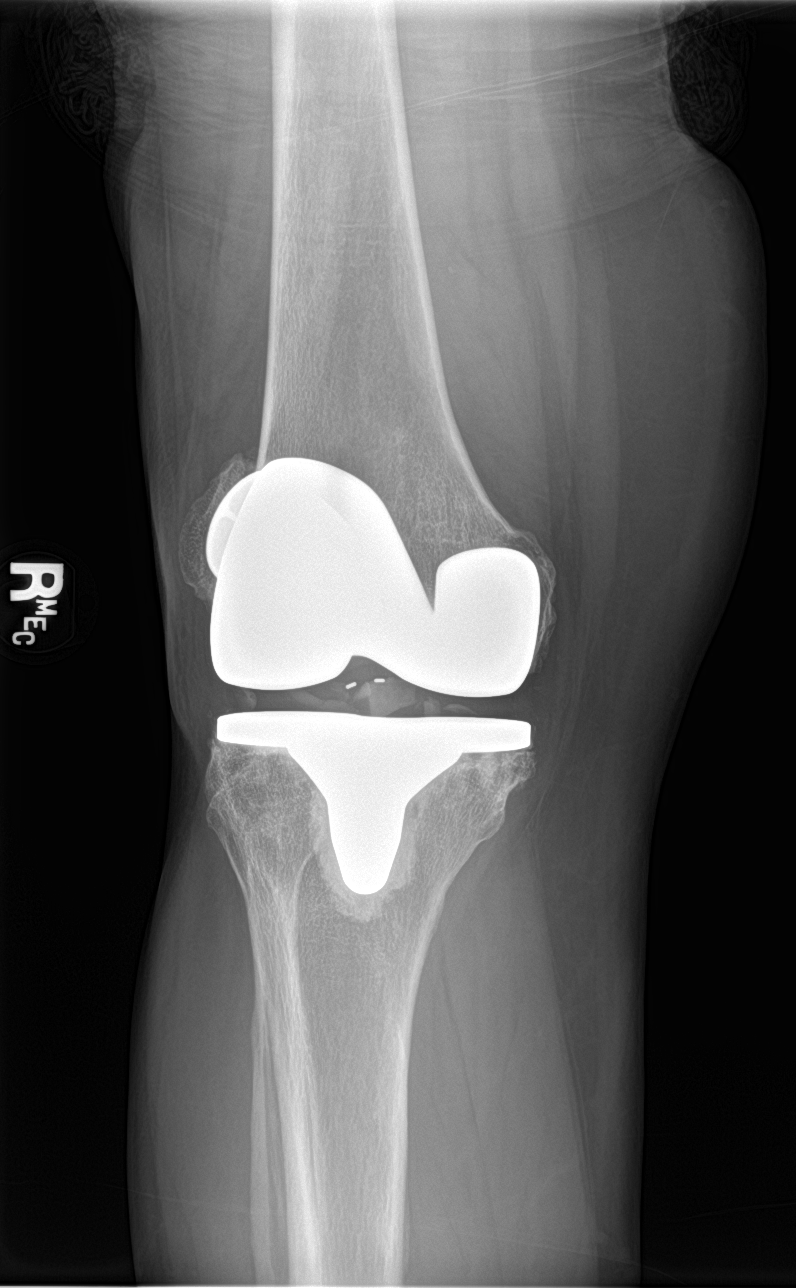
[im 4/4]
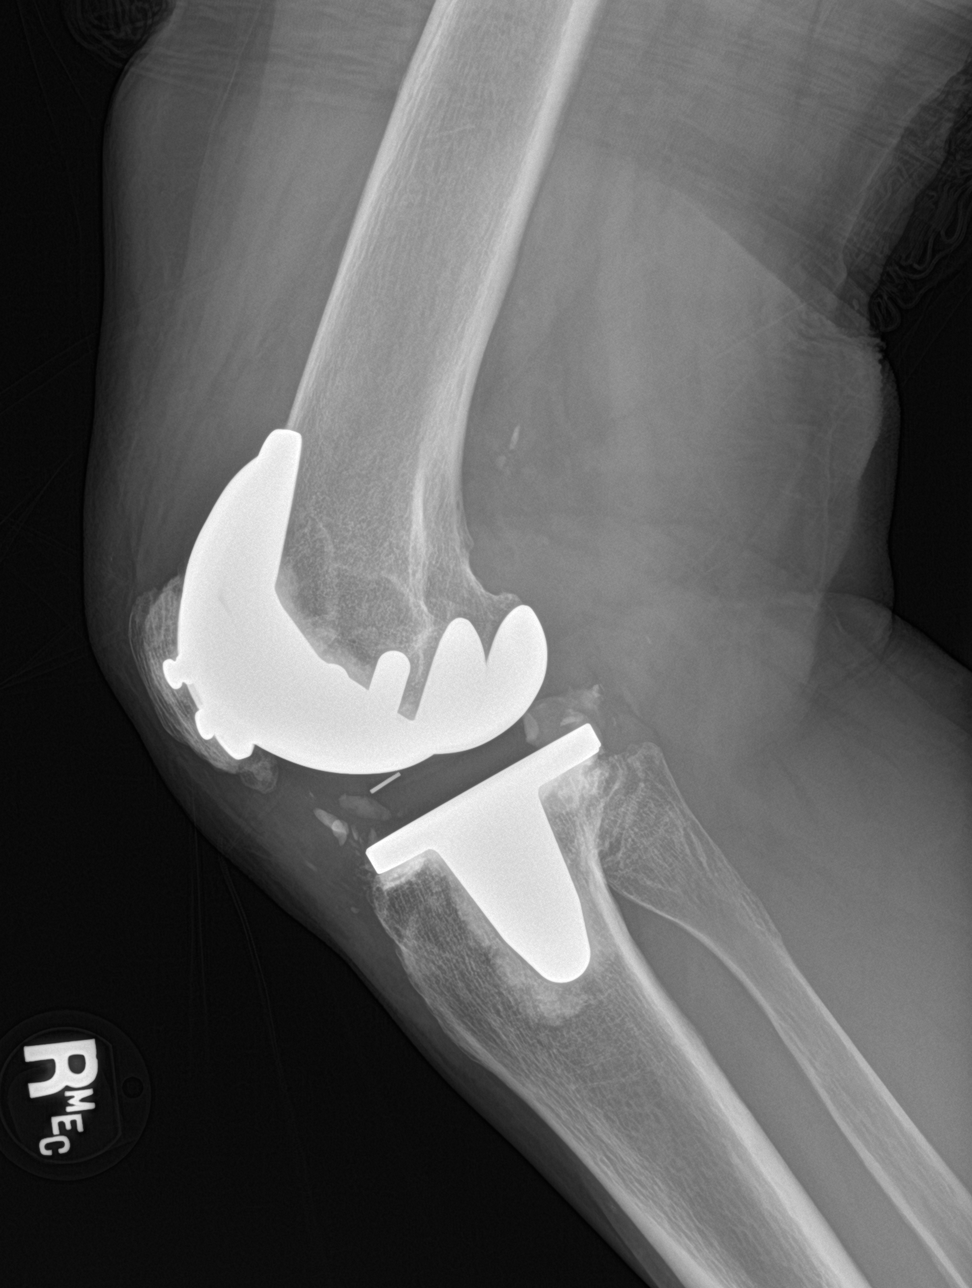

[4 of 4 positions shown; findings below may reference images not displayed]

FINDINGS: Frontal, bilateral oblique, lateral views of the right knee are
obtained. Three component right knee arthroplasty is identified in
the expected position. Ossific densities are seen along the anterior
and posterior margin of the joint space, and could reflect loose
bodies or heterotopic ossification after surgery. No acute fracture.
Small joint effusion. Soft tissues are unremarkable.
IMPRESSION: 1. No acute bony abnormality.
2. Right knee arthroplasty as above.
3. Possible loose bodies versus heterotopic ossification at the
anterior and posterior margins of the joint capsule.

## 2021-11-12 ENCOUNTER — Ambulatory Visit
Admission: EM | Admit: 2021-11-12 | Discharge: 2021-11-12 | Disposition: A | Discharge status: Home / Self Care | Payer: Medicare Other | Attending: Emergency Medicine | Admitting: Emergency Medicine

## 2021-11-12 DIAGNOSIS — R051 Acute cough: Secondary | ICD-10-CM | POA: Diagnosis present

## 2021-11-12 DIAGNOSIS — Z20822 Contact with and (suspected) exposure to covid-19: Secondary | ICD-10-CM | POA: Diagnosis not present

## 2021-11-12 DIAGNOSIS — J029 Acute pharyngitis, unspecified: Secondary | ICD-10-CM

## 2021-11-12 DIAGNOSIS — J45901 Unspecified asthma with (acute) exacerbation: Secondary | ICD-10-CM

## 2021-11-12 LAB — GROUP A STREP BY PCR: Group A Strep by PCR: NOT DETECTED

## 2021-11-12 MED ORDER — LIDOCAINE VISCOUS HCL 2 % MT SOLN: 15.0000 mL | OROMUCOSAL | 0 refills | Status: DC | PRN

## 2021-11-12 NOTE — ED Triage Notes (Addendum)
Pt presents with complaints of sore throat, shortness of breath, chest congestion, and nausea x 2 days. Reports ozempic that she takes makes her nauseous.  ?

## 2021-11-12 NOTE — Discharge Instructions (Signed)
Your strep test was negative for bacteria, your symptoms are most likely related to a virus versus exposure to the pollen, either way if symptoms should steadily improve with time ? ?Your COVID test is pending, you will be called notified only if positive ? ?You may gargle and spit lidocaine solution every 4 hours to give a temporary numbing effect to your throat ? ?You may also attempt salt water gargles, soft foods, warm liquids and throat lozenges for additional comfort ? ?If your breathing worsens you may follow-up with urgent care as needed for persisting symptoms ? ?Please continue use your inhalers as prescribed by your physician ?

## 2021-11-12 NOTE — ED Provider Notes (Signed)
[facility]    CSN: [id number] Arrival date & time: 11/12/21  1715      History   Chief Complaint Chief Complaint  Patient presents with   Sore Throat    HPI Kathryn Ramirez is a 71 y.o. female.   Patient presents with sore throat mild cough, chest tightness, shortness of breath with exertion for 2 days.  Shortness of breath improved shortly after use of albuterol inhaler tolerating food and liquids.  COVID exposure.  History of asthma.  Denies wheezing, fever, chills, body aches, nasal congestion, ear pain, headaches, abdominal pain, nausea, vomiting, diarrhea.    Past Medical History:  Diagnosis Date   Acid reflux disease    Anemia    Asthma    Bipolar 1 disorder (HCC)    COPD (chronic obstructive pulmonary disease) (HCC)    Diabetes mellitus without complication (HCC)    History of hepatitis C 2010   took interferon x 1 year   Hypertension    Liver hemangioma    Lung nodule    Psoriasis    Psoriatic arthritis (HCC)    Rheumatoid arthritis (HCC)    Tardive dyskinesia    Wears dentures    full upper and lower    Patient Active Problem List   Diagnosis Date Noted   S/P cervical spinal fusion (C5/6 and C6/7) 04/08/2020   Neuroforaminal stenosis of cervical spine (moderate, Left C3/4, C4/5; R C4/5) 04/08/2020   Pain in limb 01/27/2018   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   Erroneous encounter - disregard 03/29/2017   Heartburn    Gastritis without bleeding    Hx of colonic polyps    Arthritis 09/02/2016   Hep C w/o coma, chronic (HCC) 09/02/2016   Hyperlipidemia, unspecified 09/02/2016   Hypertension 09/02/2016   Tardive dyskinesia 09/02/2016   Urinary incontinence 09/02/2016   History of hypothyroidism 08/19/2016   Neck swelling 08/19/2016   Acute asthma exacerbation 08/19/2016   GERD (gastroesophageal reflux disease) 08/09/2016   Dry mouth 06/01/2016   Elevated antinuclear antibody (ANA) level 06/01/2016   Generalized osteoarthritis of  hand 06/01/2016   Degenerative joint disease of right knee 06/01/2016   Vocal cord polyp 05/17/2016   Chest pain 04/06/2016   Drug-induced peripheral neuropathy (HCC) 03/25/2016   Sensory ataxia 03/25/2016   Wrist pain, chronic, left 03/25/2016   Impairment of balance 02/05/2016   Acute right-sided low back pain without sciatica 02/05/2016   Normal blood pressure 01/23/2016   Pulmonary infiltrates    Chronic cough    Pulmonary scarring 01/06/2016   OAB (overactive bladder) 12/18/2015   Asthma 09/18/2015   Multiple pulmonary nodules 09/15/2015   Baker's cyst of knee, right 07/02/2015   Bipolar disorder with moderate depression (HCC) 12/19/2014   Chronic pain 12/19/2014   Carpal tunnel syndrome of left wrist 10/01/2014   Chronic RUQ pain 09/19/2014   Esophageal dysphagia 09/19/2014   Hemangioma of liver 09/19/2014   Arthropathy of cervical facet joint 08/06/2014   Cervical facet joint syndrome 07/12/2014   Cervical myelopathy with cervical radiculopathy (HCC) 03/24/2014   Hyponatremia 02/21/2014   Cervicalgia 09/18/2013   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 09/18/2013    Past Surgical History:  Procedure Laterality Date   CARPAL TUNNEL RELEASE Left 02/21/2020   Procedure: CARPAL TUNNEL RELEASE ENDOSCOPIC;  Surgeon: Christena Flake, MD;  Location: [facility];  Service: Orthopedics;  Laterality: Left;   CATARACT EXTRACTION W/ INTRAOCULAR LENS  IMPLANT, BILATERAL     CERVICAL  SPINE SURGERY  2017   [facility]   CHOLECYSTECTOMY     COLONOSCOPY WITH PROPOFOL N/A 09/14/2016   Procedure: COLONOSCOPY WITH PROPOFOL;  Surgeon: Midge Minium, MD;  Location: [facility] ENDOSCOPY;  Service: Endoscopy;  Laterality: N/A;   ESOPHAGOGASTRODUODENOSCOPY (EGD) WITH PROPOFOL N/A 09/14/2016   Procedure: ESOPHAGOGASTRODUODENOSCOPY (EGD) WITH PROPOFOL;  Surgeon: Midge Minium, MD;  Location: [facility] ENDOSCOPY;  Service: Endoscopy;  Laterality: N/A;   FLEXIBLE BRONCHOSCOPY N/A  01/13/2016   Procedure: FLEXIBLE BRONCHOSCOPY;  Surgeon: Stephanie Acre, MD;  Location: [facility];  Service: Cardiopulmonary;  Laterality: N/A;   JOINT REPLACEMENT     NEUROPLASTY MAJOR NERVE     POLYPECTOMY     THROAT SURGERY     TOTAL KNEE ARTHROPLASTY Right 06/15/2017   Procedure: TOTAL KNEE ARTHROPLASTY;  Surgeon: Deeann Saint, MD;  Location: [facility];  Service: Orthopedics;  Laterality: Right;    OB History   No obstetric history on file.      Home Medications    Prior to Admission medications   Medication Sig Start Date End Date Taking? Authorizing Provider  acetaminophen (TYLENOL) 500 MG tablet Take 500 mg by mouth 2 (two) times daily.    [provider]  albuterol (VENTOLIN HFA) 108 (90 Base) MCG/ACT inhaler INHALE 2 PUFFS EVERY 4 HOURS AS NEEDED FOR WHEEZING FOR SHORTNESS OF BREATH 03/26/20   Salena Saner, MD  ARIPiprazole (ABILIFY) 5 MG tablet Take 10 mg by mouth daily.    [provider]  buPROPion (WELLBUTRIN XL) 150 MG 24 hr tablet Take 150 mg by mouth daily.    [provider]  diazepam (VALIUM) 2 MG tablet Take 1 mg by mouth at bedtime.     [provider]  etanercept (ENBREL SURECLICK) 50 MG/ML injection  06/25/20   [provider]  gabapentin (NEURONTIN) 300 MG capsule Take 300 mg by mouth 3 (three) times daily.    [provider]  montelukast (SINGULAIR) 10 MG tablet Take 1 tablet (10 mg total) by mouth at bedtime for 14 days. 08/17/20 03/29/21  Bailey Mech, NP  ondansetron (ZOFRAN ODT) 8 MG disintegrating tablet Take 1 tablet (8 mg total) by mouth every 8 (eight) hours as needed for nausea or vomiting. 11/05/20   Becky Augusta, NP  oxybutynin (DITROPAN) 5 MG tablet Take 5 mg by mouth every morning.     [provider]  pantoprazole (PROTONIX) 40 MG tablet TAKE 1 TABLET (40 MG TOTAL) BY MOUTH EVERY EVENING. 02/21/20 03/29/21  Salena Saner, MD  phenazopyridine (PYRIDIUM) 200 MG tablet Take 1 tablet  (200 mg total) by mouth 3 (three) times daily. 11/05/20   Becky Augusta, NP  rosuvastatin (CRESTOR) 10 MG tablet Take 1 tablet (10 mg total) by mouth at bedtime. 10/03/17   Kerman Passey, MD  SYMBICORT 160-4.5 MCG/ACT inhaler Inhale into the lungs. 03/26/21   [provider]  SYNJARDY XR 25-1000 MG TB24 Take 1 tablet by mouth daily.  08/30/19   [provider]  traZODone (DESYREL) 100 MG tablet Take 200 mg by mouth at bedtime.    [provider]    Family History Family History  Problem Relation Age of Onset   Cancer Mother        Esophageal Ca   Asthma Brother    Breast cancer Neg Hx     Social History Social History   Tobacco Use   Smoking status: Former    Packs/day: 1.00    Years: 30.00  Pack years: 30.00    Types: E-cigarettes, Cigarettes    Quit date: 09/19/2015    Years since quitting: 6.1   Smokeless tobacco: Never  Vaping Use   Vaping Use: Never used  Substance Use Topics   Alcohol use: Not Currently    Alcohol/week: 0.0 standard drinks    Comment: 03/31/2012 sobierty    Drug use: No     Allergies   Codeine, Haldol [haloperidol], Haloperidol lactate, Meloxicam, and Trileptal [oxcarbazepine]   Review of Systems Review of Systems  Constitutional: Negative.   HENT:  Positive for sore throat. Negative for congestion, dental problem, drooling, ear discharge, ear pain, facial swelling, hearing loss, mouth sores, nosebleeds, postnasal drip, rhinorrhea, sinus pressure, sinus pain, sneezing, tinnitus, trouble swallowing and voice change.   Respiratory:  Positive for chest tightness and shortness of breath. Negative for apnea, cough, choking, wheezing and stridor.   Cardiovascular: Negative.   Gastrointestinal: Negative.   Skin: Negative.   Neurological: Negative.     Physical Exam Triage Vital Signs ED Triage Vitals  Enc Vitals Group     BP 11/12/21 1726 131/90     Pulse Rate 11/12/21 1726 95     Resp 11/12/21 1726 19     Temp 11/12/21  1724 99 F (37.2 C)     Temp src --      SpO2 11/12/21 1726 95 %     Weight --      Height --      Head Circumference --      Peak Flow --      Pain Score 11/12/21 1722 3     Pain Loc --      Pain Edu? --      Excl. in GC? --    No data found.  Updated Vital Signs BP 131/90   Pulse 95   Temp 99 F (37.2 C)   Resp 19   SpO2 95%   Visual Acuity Right Eye Distance:   Left Eye Distance:   Bilateral Distance:    Right Eye Near:   Left Eye Near:    Bilateral Near:     Physical Exam Constitutional:      Appearance: She is well-developed.  HENT:     Right Ear: Tympanic membrane and ear canal normal.     Left Ear: Tympanic membrane and ear canal normal.     Nose: No congestion or rhinorrhea.     Mouth/Throat:     Mouth: Mucous membranes are moist.     Pharynx: Posterior oropharyngeal erythema present.     Tonsils: No tonsillar exudate. 0 on the right. 0 on the left.  Cardiovascular:     Rate and Rhythm: Normal rate and regular rhythm.     Heart sounds: Normal heart sounds.  Pulmonary:     Effort: Pulmonary effort is normal.     Breath sounds: Normal breath sounds.  Musculoskeletal:     Cervical back: Normal range of motion.  Lymphadenopathy:     Cervical: Cervical adenopathy present.  Skin:    General: Skin is warm and dry.  Neurological:     General: No focal deficit present.     Mental Status: She is alert and oriented to person, place, and time.  Psychiatric:        Mood and Affect: Mood normal.        Behavior: Behavior normal.     UC Treatments / Results  Labs (all labs ordered are listed, but only abnormal results are  displayed) Labs Reviewed - No data to display  EKG   Radiology No results found.  Procedures Procedures (including critical care time)  Medications Ordered in UC Medications - No data to display  Initial Impression / Assessment and Plan / UC Course  I have reviewed the triage vital signs and the nursing notes.  Pertinent  labs & imaging results that were available during my care of the patient were reviewed by me and considered in my medical decision making (see chart for details).  Sore Throat  COVID test pending ,strep PCR negative, etiology of symptoms is most likely viral versus exposure to pollen, discussed with patient, prescribed lidocaine viscous for supportive care, may attempt salt water gargles, throat lozenges warm liquids and soft foods for additional comfort, offered prednisone course to help manage shortness of breath related to asthma, declined, will continue to use inhalers as prescribed, O2 saturation 95% and lungs clear to auscultation, patient in no signs of distress, stable for outpatient treatment, given strict precautions for worsening signs of breathing to follow-up with urgent care as needed Final Clinical Impressions(s) / UC Diagnoses   Final diagnoses:  None   Discharge Instructions   None    ED Prescriptions   None    PDMP not reviewed this encounter.   Valinda Hoar, NP 11/12/21 1815

## 2021-11-13 LAB — SARS CORONAVIRUS 2 (TAT 6-24 HRS): SARS Coronavirus 2: NEGATIVE

## 2022-07-28 ENCOUNTER — Encounter: Payer: Self-pay | Admitting: Emergency Medicine

## 2022-07-28 ENCOUNTER — Ambulatory Visit (INDEPENDENT_AMBULATORY_CARE_PROVIDER_SITE_OTHER): Payer: Medicare HMO

## 2022-07-28 ENCOUNTER — Other Ambulatory Visit: Payer: Self-pay

## 2022-07-28 ENCOUNTER — Ambulatory Visit
Admission: EM | Admit: 2022-07-28 | Discharge: 2022-07-28 | Disposition: A | Discharge status: Home / Self Care | Payer: Medicare HMO | Attending: Emergency Medicine | Admitting: Emergency Medicine

## 2022-07-28 DIAGNOSIS — J069 Acute upper respiratory infection, unspecified: Secondary | ICD-10-CM | POA: Diagnosis present

## 2022-07-28 DIAGNOSIS — J4531 Mild persistent asthma with (acute) exacerbation: Secondary | ICD-10-CM | POA: Diagnosis present

## 2022-07-28 DIAGNOSIS — Z20822 Contact with and (suspected) exposure to covid-19: Secondary | ICD-10-CM | POA: Insufficient documentation

## 2022-07-28 DIAGNOSIS — Z79899 Other long term (current) drug therapy: Secondary | ICD-10-CM | POA: Insufficient documentation

## 2022-07-28 DIAGNOSIS — Z7952 Long term (current) use of systemic steroids: Secondary | ICD-10-CM | POA: Insufficient documentation

## 2022-07-28 LAB — SARS CORONAVIRUS 2 BY RT PCR: SARS Coronavirus 2 by RT PCR: NEGATIVE

## 2022-07-28 MED ORDER — BENZONATATE 100 MG PO CAPS: 200.0000 mg | ORAL_CAPSULE | Freq: Three times a day (TID) | ORAL | 0 refills | Status: DC

## 2022-07-28 MED ORDER — PROMETHAZINE-DM 6.25-15 MG/5ML PO SYRP: 5.0000 mL | ORAL_SOLUTION | Freq: Four times a day (QID) | ORAL | 0 refills | Status: DC | PRN

## 2022-07-28 MED ORDER — PREDNISONE 20 MG PO TABS: 60.0000 mg | ORAL_TABLET | Freq: Every day | ORAL | 0 refills | Status: AC

## 2022-07-28 NOTE — Discharge Instructions (Addendum)
Your test today for COVID was negative and your chest x-ray did not reveal the presence of any pneumonia.  You do have an upper respiratory infection notably is viral and I believe it is just making your asthma worse which is causing your shortness of breath.  Continue your inhalers as previously prescribed.  Use the albuterol every 4-6 hours as needed for shortness of breath and wheezing.  Start the prednisone this morning and you will take 60 mg each morning at breakfast for 5 days to help with your pulmonary inflammation and improve your breathing.  Use the Tessalon Perles every 8 hours during the day as needed for cough.  They may give you metallic taste in your mouth or a numbness to the base of your tongue, this is normal.  Taken with a small sip of water.  Use the Promethazine DM cough syrup at bedtime only as needed for cough and congestion.  This medication will make you sleepy so make sure that you have your faculties about 2 if you have to get up in the middle the night to use the bathroom.  Sit at the edge of the bed and make sure that you are steady before you get up to walk.  If you have any worsening shortness of breath, develop fevers, develop a cough with sputum production, or any other concerning symptoms please return for reevaluation or seek care in the ER.

## 2022-07-28 NOTE — ED Provider Notes (Signed)
[facility]    CSN: [id number] Arrival date & time: 07/28/22  0801      History   Chief Complaint Chief Complaint  Patient presents with   Covid Exposure    HPI Kathryn Ramirez is a 72 y.o. female.   HPI  72 year old female here for evaluation of shortness of breath.  Patient is significant past medical history to include bipolar disorder, pulmonary scarring, GERD, hepatitis C, hypertension, hyponatremia, and asthma who presents for evaluation of 2 days worth of shortness of breath and intermittent pleuritic chest pain.  She states that she does have wheezing on occasion for which she uses her inhalers.  Inhalers improve her wheezing and her oxygen saturation but she still feels like she cannot catch her breath.  Had a sore throat and nausea.  She takes Zofran for the nausea which helps.  Her friend was diagnosed as COVID-positive yesterday.  She denies any runny nose or nasal congestion, vomiting or diarrhea, or recent travel.  Past Medical History:  Diagnosis Date   Acid reflux disease    Anemia    Asthma    Bipolar 1 disorder ([place])    COPD (chronic obstructive pulmonary disease) ([place])    Diabetes mellitus without complication ([place])    History of hepatitis C 2010   took interferon x 1 year   Hypertension    Liver hemangioma    Lung nodule    Psoriasis    Psoriatic arthritis ([place])    Rheumatoid arthritis ([place])    Tardive dyskinesia    Wears dentures    full upper and lower    Patient Active Problem List   Diagnosis Date Noted   S/P cervical spinal fusion (C5/6 and C6/7) 04/08/2020   Neuroforaminal stenosis of cervical spine (moderate, Left C3/4, C4/5; R C4/5) 04/08/2020   Pain in limb 01/27/2018   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   Erroneous encounter - disregard 03/29/2017   Heartburn    Gastritis without bleeding    Hx of colonic polyps    Arthritis 09/02/2016   Hep C w/o coma, chronic ([place]) 09/02/2016   Hyperlipidemia, unspecified  09/02/2016   Hypertension 09/02/2016   Tardive dyskinesia 09/02/2016   Urinary incontinence 09/02/2016   History of hypothyroidism 08/19/2016   Neck swelling 08/19/2016   Acute asthma exacerbation 08/19/2016   GERD (gastroesophageal reflux disease) 08/09/2016   Dry mouth 06/01/2016   Elevated antinuclear antibody (ANA) level 06/01/2016   Generalized osteoarthritis of hand 06/01/2016   Degenerative joint disease of right knee 06/01/2016   Vocal cord polyp 05/17/2016   Chest pain 04/06/2016   Drug-induced peripheral neuropathy ([place]) 03/25/2016   Sensory ataxia 03/25/2016   Wrist pain, chronic, left 03/25/2016   Impairment of balance 02/05/2016   Acute right-sided low back pain without sciatica 02/05/2016   Normal blood pressure 01/23/2016   Pulmonary infiltrates    Chronic cough    Pulmonary scarring 01/06/2016   OAB (overactive bladder) 12/18/2015   Asthma 09/18/2015   Multiple pulmonary nodules 09/15/2015   Baker's cyst of knee, right 07/02/2015   Bipolar disorder with moderate depression ([employer]) 12/19/2014   Chronic pain 12/19/2014   Carpal tunnel syndrome of left wrist 10/01/2014   Chronic RUQ pain 09/19/2014   Esophageal dysphagia 09/19/2014   Hemangioma of liver 09/19/2014   Arthropathy of cervical facet joint 08/06/2014   Cervical facet joint syndrome 07/12/2014   Cervical myelopathy with cervical radiculopathy ([place]) 03/24/2014   Hyponatremia 02/21/2014   Cervicalgia 09/18/2013  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 09/18/2013    Past Surgical History:  Procedure Laterality Date   CARPAL TUNNEL RELEASE Left 02/21/2020   Procedure: CARPAL TUNNEL RELEASE ENDOSCOPIC;  Surgeon: Corky Mull, MD;  Location: [facility];  Service: Orthopedics;  Laterality: Left;   CATARACT EXTRACTION W/ INTRAOCULAR LENS  IMPLANT, BILATERAL     CERVICAL SPINE SURGERY  2017   [facility]   CHOLECYSTECTOMY     COLONOSCOPY WITH PROPOFOL N/A 09/14/2016    Procedure: COLONOSCOPY WITH PROPOFOL;  Surgeon: Lucilla Lame, MD;  Location: [facility];  Service: Endoscopy;  Laterality: N/A;   ESOPHAGOGASTRODUODENOSCOPY (EGD) WITH PROPOFOL N/A 09/14/2016   Procedure: ESOPHAGOGASTRODUODENOSCOPY (EGD) WITH PROPOFOL;  Surgeon: Lucilla Lame, MD;  Location: [facility];  Service: Endoscopy;  Laterality: N/A;   FLEXIBLE BRONCHOSCOPY N/A 01/13/2016   Procedure: FLEXIBLE BRONCHOSCOPY;  Surgeon: Vilinda Boehringer, MD;  Location: [facility];  Service: Cardiopulmonary;  Laterality: N/A;   JOINT REPLACEMENT     NEUROPLASTY MAJOR NERVE     POLYPECTOMY     THROAT SURGERY     TOTAL KNEE ARTHROPLASTY Right 06/15/2017   Procedure: TOTAL KNEE ARTHROPLASTY;  Surgeon: Earnestine Leys, MD;  Location: [facility];  Service: Orthopedics;  Laterality: Right;    OB History   No obstetric history on file.      Home Medications    Prior to Admission medications   Medication Sig Start Date End Date Taking? Authorizing Provider  benzonatate (TESSALON) 100 MG capsule Take 2 capsules (200 mg total) by mouth every 8 (eight) hours. 07/28/22  Yes Margarette Canada, NP  LORazepam (ATIVAN) 0.5 MG tablet Take 0.25 mg by mouth as needed. 05/20/22 08/18/22 Yes [provider]  OZEMPIC, 1 MG/DOSE, 4 MG/3ML SOPN INJECT 1 MG UNDER THE SKIN EVERY 7 DAYS 03/24/22  Yes [provider]  predniSONE (DELTASONE) 20 MG tablet Take 3 tablets (60 mg total) by mouth daily with breakfast for 5 days. 3 tablets daily for 5 days. 07/28/22 08/02/22 Yes Margarette Canada, NP  promethazine-dextromethorphan (PROMETHAZINE-DM) 6.25-15 MG/5ML syrup Take 5 mLs by mouth 4 (four) times daily as needed. 07/28/22  Yes Margarette Canada, NP  acetaminophen (TYLENOL) 500 MG tablet Take 500 mg by mouth 2 (two) times daily.    [provider]  albuterol (VENTOLIN HFA) 108 (90 Base) MCG/ACT inhaler INHALE 2 PUFFS EVERY 4 HOURS AS NEEDED FOR WHEEZING FOR SHORTNESS OF BREATH 03/26/20   Tyler Pita, MD  ARIPiprazole  (ABILIFY) 5 MG tablet Take 10 mg by mouth daily.    [provider]  buPROPion (WELLBUTRIN XL) 150 MG 24 hr tablet Take 150 mg by mouth daily.    [provider]  gabapentin (NEURONTIN) 300 MG capsule Take 300 mg by mouth 3 (three) times daily.    [provider]  oxybutynin (DITROPAN) 5 MG tablet Take 5 mg by mouth every morning.     [provider]  pantoprazole (PROTONIX) 40 MG tablet TAKE 1 TABLET (40 MG TOTAL) BY MOUTH EVERY EVENING. 02/21/20 03/29/21  Tyler Pita, MD  rosuvastatin (CRESTOR) 10 MG tablet Take 1 tablet (10 mg total) by mouth at bedtime. 10/03/17   Arnetha Courser, MD  SYMBICORT 160-4.5 MCG/ACT inhaler Inhale into the lungs. 03/26/21   [provider]  SYNJARDY XR 25-1000 MG TB24 Take 1 tablet by mouth daily.  08/30/19   [provider]  traZODone (DESYREL) 100 MG tablet Take 200 mg by mouth at bedtime.  [provider]    Family History Family History  Problem Relation Age of Onset   Cancer Mother        Esophageal Ca   Asthma Brother    Breast cancer Neg Hx     Social History Social History   Tobacco Use   Smoking status: Former    Packs/day: 1.00    Years: 30.00    Total pack years: 30.00    Types: E-cigarettes, Cigarettes    Quit date: 09/19/2015    Years since quitting: 6.8   Smokeless tobacco: Never  Vaping Use   Vaping Use: Never used  Substance Use Topics   Alcohol use: Not Currently    Alcohol/week: 0.0 standard drinks of alcohol    Comment: 03/31/2012 sobierty    Drug use: No     Allergies   Codeine, Haldol [haloperidol], Haloperidol lactate, Meloxicam, Trileptal [oxcarbazepine], and Benadryl [diphenhydramine]   Review of Systems Review of Systems  Constitutional:  Negative for fever.  HENT:  Positive for sore throat. Negative for congestion, ear pain and rhinorrhea.   Respiratory:  Positive for cough, shortness of breath and wheezing.   Gastrointestinal:  Positive for  nausea. Negative for diarrhea and vomiting.     Physical Exam Triage Vital Signs ED Triage Vitals  Enc Vitals Group     BP      Pulse      Resp      Temp      Temp src      SpO2      Weight      Height      Head Circumference      Peak Flow      Pain Score      Pain Loc      Pain Edu?      Excl. in [place]?    No data found.  Updated Vital Signs BP 131/83 (BP Location: Right Arm)   Pulse 94   Temp 98 F (36.7 C) (Oral)   Resp 19   Ht '5\' 3"'$  (1.6 m)   Wt 167 lb (75.8 kg)   SpO2 96%   BMI 29.58 kg/m   Visual Acuity Right Eye Distance:   Left Eye Distance:   Bilateral Distance:    Right Eye Near:   Left Eye Near:    Bilateral Near:     Physical Exam Vitals and nursing note reviewed.  Constitutional:      Appearance: Normal appearance. She is not ill-appearing.  HENT:     Head: Normocephalic and atraumatic.     Right Ear: Tympanic membrane, ear canal and external ear normal. There is no impacted cerumen.     Left Ear: Tympanic membrane, ear canal and external ear normal. There is no impacted cerumen.     Nose: Congestion and rhinorrhea present.     Comments: Nasal mucosa is mildly erythematous and edematous.  There is scant clear rhinorrhea present both nares.    Mouth/Throat:     Mouth: Mucous membranes are moist.     Pharynx: Oropharynx is clear. Posterior oropharyngeal erythema present. No oropharyngeal exudate.     Comments: Posterior oropharynx is erythematous with injection.  There is clear postnasal drip.  No exudate appreciated. Cardiovascular:     Rate and Rhythm: Normal rate and regular rhythm.     Pulses: Normal pulses.     Heart sounds: Normal heart sounds. No murmur heard.    No friction rub. No gallop.  Pulmonary:  Effort: Pulmonary effort is normal. No respiratory distress.     Breath sounds: Normal breath sounds. No wheezing, rhonchi or rales.  Musculoskeletal:     Cervical back: Normal range of motion and neck supple. No tenderness.   Lymphadenopathy:     Cervical: No cervical adenopathy.  Skin:    General: Skin is warm and dry.     Capillary Refill: Capillary refill takes less than 2 seconds.     Findings: No erythema or rash.  Neurological:     General: No focal deficit present.     Mental Status: She is alert and oriented to person, place, and time.  Psychiatric:        Mood and Affect: Mood normal.        Behavior: Behavior normal.        Thought Content: Thought content normal.        Judgment: Judgment normal.      UC Treatments / Results  Labs (all labs ordered are listed, but only abnormal results are displayed) Labs Reviewed  SARS CORONAVIRUS 2 BY RT PCR    EKG Normal sinus rhythm with a ventricular rate of 86 bpm Peer interval 170 ms QRS duration 84 ms QT/QTc 360/430 ms No T wave or ST abnormalities noted. No change when compared to EKG from 04/30/2020.  Radiology DG Chest 2 View  Result Date: 07/28/2022 CLINICAL DATA:  Two days of shortness of breath. EXAM: CHEST - 2 VIEW COMPARISON:  Chest radiograph March 29, 2021. FINDINGS: The heart size and mediastinal contours are within normal limits. Aortic atherosclerosis. Stable minimal linear scarring in the left costophrenic angle. No new focal consolidation. No pleural effusion. No pneumothorax. Anterior cervical fusion hardware. IMPRESSION: No active cardiopulmonary disease. Electronically Signed   By: Dahlia Bailiff M.D.   On: 07/28/2022 08:52    Procedures Procedures (including critical care time)  Medications Ordered in UC Medications - No data to display  Initial Impression / Assessment and Plan / UC Course  I have reviewed the triage vital signs and the nursing notes.  Pertinent labs & imaging results that were available during my care of the patient were reviewed by me and considered in my medical decision making (see chart for details).   Patient is a nontoxic-appearing 72 year old female here for evaluation of 2 days worth of  pleuritic chest pain with a nonproductive cough, sore throat, intermittent wheezing, and nausea.  She has been exposed to [place] and states that her friend tested positive yesterday.  She denies any runny nose or nasal congestion and she denies vomiting or diarrhea.  She has had nausea for which she is using Zofran with good effect.  She has been using her inhalers for her shortness breath and wheezing.  The wheezing improves and patient's oxygen saturation improves, she has a home pulse ox, but she states she still feels short of breath.  She is able to speak in full sentences without any dyspnea or tachypnea.  Her lung sounds are clear to auscultation all fields.  She does have inflammation of her nasal mucosa and posterior oropharynx.  Given that the patient was exposed to [place] I will order a COVID PCR.  I will also order chest x-ray and EKG just because she does have pleuritic chest pain, history of asthma, and cough.   EKG shows normal sinus rhythm without any T wave or ST abnormalities.  I compared it to another tracing in epic from 04/30/2020 and there is no change.  Radiology  impression of chest x-ray states that the heart size and mediastinal contours are within normal limits and there is stable minimal linear scarring in the left costophrenic angle but no active cardiopulmonary disease.  COVID PCR is negative.  I will discharge patient home with a diagnosis of URI and asthma exacerbation.  I will have her continue her inhalers and will add prednisone to her therapeutic regimen to decrease pulmonary inflammation.  Also Tessalon Perles and Promethazine DM cough syrup as needed for cough.   Final Clinical Impressions(s) / UC Diagnoses   Final diagnoses:  Acute upper respiratory infection  Mild persistent asthma with acute exacerbation     Discharge Instructions      Your test today for COVID was negative and your chest x-ray did not reveal the presence of any pneumonia.  You do have an  upper respiratory infection notably is viral and I believe it is just making your asthma worse which is causing your shortness of breath.  Continue your inhalers as previously prescribed.  Use the albuterol every 4-6 hours as needed for shortness of breath and wheezing.  Start the prednisone this morning and you will take 60 mg each morning at breakfast for 5 days to help with your pulmonary inflammation and improve your breathing.  Use the Tessalon Perles every 8 hours during the day as needed for cough.  They may give you metallic taste in your mouth or a numbness to the base of your tongue, this is normal.  Taken with a small sip of water.  Use the Promethazine DM cough syrup at bedtime only as needed for cough and congestion.  This medication will make you sleepy so make sure that you have your faculties about 2 if you have to get up in the middle the night to use the bathroom.  Sit at the edge of the bed and make sure that you are steady before you get up to walk.  If you have any worsening shortness of breath, develop fevers, develop a cough with sputum production, or any other concerning symptoms please return for reevaluation or seek care in the ER.     ED Prescriptions     Medication Sig Dispense Auth. Provider   benzonatate (TESSALON) 100 MG capsule Take 2 capsules (200 mg total) by mouth every 8 (eight) hours. 21 capsule Margarette Canada, NP   predniSONE (DELTASONE) 20 MG tablet Take 3 tablets (60 mg total) by mouth daily with breakfast for 5 days. 3 tablets daily for 5 days. 15 tablet Margarette Canada, NP   promethazine-dextromethorphan (PROMETHAZINE-DM) 6.25-15 MG/5ML syrup Take 5 mLs by mouth 4 (four) times daily as needed. 118 mL Margarette Canada, NP      PDMP not reviewed this encounter.   Margarette Canada, NP 07/28/22 [phone]

## 2022-07-28 NOTE — ED Triage Notes (Addendum)
Pt reports SOB for past two days. Has asthma and inhalers don't seem to be working. Dry, non-productive cough. Reports COVID exposure. States hurts to breathe in.

## 2022-10-09 ENCOUNTER — Ambulatory Visit: Payer: Medicare HMO

## 2022-10-27 ENCOUNTER — Ambulatory Visit: Payer: Medicare HMO | Admitting: Urology

## 2023-02-10 ENCOUNTER — Other Ambulatory Visit: Payer: Self-pay | Admitting: Orthopedic Surgery

## 2023-02-10 DIAGNOSIS — Z96651 Presence of right artificial knee joint: Secondary | ICD-10-CM

## 2023-02-10 DIAGNOSIS — T8484XA Pain due to internal orthopedic prosthetic devices, implants and grafts, initial encounter: Secondary | ICD-10-CM

## 2023-02-21 ENCOUNTER — Encounter: Admission: RE | Admit: 2023-02-21 | Payer: Medicare HMO | Source: Ambulatory Visit

## 2023-02-21 ENCOUNTER — Encounter
Admission: RE | Admit: 2023-02-21 | Discharge: 2023-02-21 | Disposition: A | Discharge status: Home / Self Care | Payer: Medicare HMO | Source: Ambulatory Visit | Attending: Orthopedic Surgery | Admitting: Orthopedic Surgery

## 2023-02-21 DIAGNOSIS — Z96651 Presence of right artificial knee joint: Secondary | ICD-10-CM | POA: Diagnosis not present

## 2023-02-21 DIAGNOSIS — T8484XA Pain due to internal orthopedic prosthetic devices, implants and grafts, initial encounter: Secondary | ICD-10-CM | POA: Diagnosis present

## 2023-02-21 MED ORDER — TECHNETIUM TC 99M MEDRONATE IV KIT
20.0000 | PACK | Freq: Once | INTRAVENOUS | Status: AC | PRN
  Administered 2023-02-21: 21.73 via INTRAVENOUS

## 2023-03-18 ENCOUNTER — Encounter: Payer: Self-pay | Admitting: Emergency Medicine

## 2023-03-18 ENCOUNTER — Ambulatory Visit
Admission: EM | Admit: 2023-03-18 | Discharge: 2023-03-18 | Disposition: A | Discharge status: Home / Self Care | Payer: Medicare HMO | Attending: Emergency Medicine | Admitting: Emergency Medicine

## 2023-03-18 DIAGNOSIS — U071 COVID-19: Secondary | ICD-10-CM | POA: Insufficient documentation

## 2023-03-18 LAB — SARS CORONAVIRUS 2 BY RT PCR: SARS Coronavirus 2 by RT PCR: POSITIVE — AB

## 2023-03-18 MED ORDER — IPRATROPIUM BROMIDE 0.06 % NA SOLN: 2.0000 | Freq: Four times a day (QID) | NASAL | 12 refills | Status: DC

## 2023-03-18 MED ORDER — BENZONATATE 100 MG PO CAPS: 200.0000 mg | ORAL_CAPSULE | Freq: Three times a day (TID) | ORAL | 0 refills | Status: DC

## 2023-03-18 MED ORDER — MOLNUPIRAVIR EUA 200MG CAPSULE: 4.0000 | ORAL_CAPSULE | Freq: Two times a day (BID) | ORAL | 0 refills | Status: AC

## 2023-03-18 MED ORDER — PROMETHAZINE-DM 6.25-15 MG/5ML PO SYRP: 5.0000 mL | ORAL_SOLUTION | Freq: Four times a day (QID) | ORAL | 0 refills | Status: DC | PRN

## 2023-03-18 NOTE — ED Provider Notes (Signed)
[facility]    CSN: [id number] Arrival date & time: 03/18/23  0801      History   Chief Complaint Chief Complaint  Patient presents with   Covid Positive    HPI Kathryn Ramirez is a 72 y.o. female.   HPI  72 year old female with a past medical history significant for diabetes, COPD, asthma, bipolar type I disorder, RA, psoriasis with psoriatic arthritis, and rheumatoid arthritis, and hypertension presents for evaluation after testing positive for COVID at home.  Her symptoms began 2 days ago with a subjective fever, runny nose, nasal congestion, sore throat, nonproductive cough, shortness of breath, and wheezing.  She has been using her albuterol inhaler which has helped.  She states that her apartment manager son has COVID and thinks that is where she may have contracted this.  She denies any recent travel.  Past Medical History:  Diagnosis Date   Acid reflux disease    Anemia    Asthma    Bipolar 1 disorder (HCC)    COPD (chronic obstructive pulmonary disease) (HCC)    Diabetes mellitus without complication (HCC)    History of hepatitis C 2010   took interferon x 1 year   Hypertension    Liver hemangioma    Lung nodule    Psoriasis    Psoriatic arthritis (HCC)    Rheumatoid arthritis (HCC)    Tardive dyskinesia    Wears dentures    full upper and lower    Patient Active Problem List   Diagnosis Date Noted   S/P cervical spinal fusion (C5/6 and C6/7) 04/08/2020   Neuroforaminal stenosis of cervical spine (moderate, Left C3/4, C4/5; R C4/5) 04/08/2020   Pain in limb 01/27/2018   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   Erroneous encounter - disregard 03/29/2017   Heartburn    Gastritis without bleeding    Hx of colonic polyps    Arthritis 09/02/2016   Hep C w/o coma, chronic (HCC) 09/02/2016   Hyperlipidemia, unspecified 09/02/2016   Hypertension 09/02/2016   Tardive dyskinesia 09/02/2016   Urinary incontinence 09/02/2016   History of  hypothyroidism 08/19/2016   Neck swelling 08/19/2016   Acute asthma exacerbation 08/19/2016   GERD (gastroesophageal reflux disease) 08/09/2016   Dry mouth 06/01/2016   Elevated antinuclear antibody (ANA) level 06/01/2016   Generalized osteoarthritis of hand 06/01/2016   Degenerative joint disease of right knee 06/01/2016   Vocal cord polyp 05/17/2016   Chest pain 04/06/2016   Drug-induced peripheral neuropathy (HCC) 03/25/2016   Sensory ataxia 03/25/2016   Wrist pain, chronic, left 03/25/2016   Impairment of balance 02/05/2016   Acute right-sided low back pain without sciatica 02/05/2016   Normal blood pressure 01/23/2016   Pulmonary infiltrates    Chronic cough    Pulmonary scarring 01/06/2016   OAB (overactive bladder) 12/18/2015   Asthma 09/18/2015   Multiple pulmonary nodules 09/15/2015   Baker's cyst of knee, right 07/02/2015   Bipolar disorder with moderate depression (HCC) 12/19/2014   Chronic pain 12/19/2014   Carpal tunnel syndrome of left wrist 10/01/2014   Chronic RUQ pain 09/19/2014   Esophageal dysphagia 09/19/2014   Hemangioma of liver 09/19/2014   Arthropathy of cervical facet joint 08/06/2014   Cervical facet joint syndrome 07/12/2014   Cervical myelopathy with cervical radiculopathy (HCC) 03/24/2014   Hyponatremia 02/21/2014   Cervicalgia 09/18/2013   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 09/18/2013    Past Surgical History:  Procedure Laterality Date   CARPAL TUNNEL  RELEASE Left 02/21/2020   Procedure: CARPAL TUNNEL RELEASE ENDOSCOPIC;  Surgeon: Christena Flake, MD;  Location: [facility];  Service: Orthopedics;  Laterality: Left;   CATARACT EXTRACTION W/ INTRAOCULAR LENS  IMPLANT, BILATERAL     CERVICAL SPINE SURGERY  2017   [facility]   CHOLECYSTECTOMY     COLONOSCOPY WITH PROPOFOL N/A 09/14/2016   Procedure: COLONOSCOPY WITH PROPOFOL;  Surgeon: Midge Minium, MD;  Location: [facility];  Service: Endoscopy;   Laterality: N/A;   ESOPHAGOGASTRODUODENOSCOPY (EGD) WITH PROPOFOL N/A 09/14/2016   Procedure: ESOPHAGOGASTRODUODENOSCOPY (EGD) WITH PROPOFOL;  Surgeon: Midge Minium, MD;  Location: [facility];  Service: Endoscopy;  Laterality: N/A;   FLEXIBLE BRONCHOSCOPY N/A 01/13/2016   Procedure: FLEXIBLE BRONCHOSCOPY;  Surgeon: Stephanie Acre, MD;  Location: [facility];  Service: Cardiopulmonary;  Laterality: N/A;   JOINT REPLACEMENT     NEUROPLASTY MAJOR NERVE     POLYPECTOMY     THROAT SURGERY     TOTAL KNEE ARTHROPLASTY Right 06/15/2017   Procedure: TOTAL KNEE ARTHROPLASTY;  Surgeon: Deeann Saint, MD;  Location: [facility];  Service: Orthopedics;  Laterality: Right;    OB History   No obstetric history on file.      Home Medications    Prior to Admission medications   Medication Sig Start Date End Date Taking? Authorizing Provider  benzonatate (TESSALON) 100 MG capsule Take 2 capsules (200 mg total) by mouth every 8 (eight) hours. 03/18/23  Yes Becky Augusta, NP  ipratropium (ATROVENT) 0.06 % nasal spray Place 2 sprays into both nostrils 4 (four) times daily. 03/18/23  Yes Becky Augusta, NP  molnupiravir EUA (LAGEVRIO) 200 mg CAPS capsule Take 4 capsules (800 mg total) by mouth 2 (two) times daily for 5 days. 03/18/23 03/23/23 Yes Becky Augusta, NP  promethazine-dextromethorphan (PROMETHAZINE-DM) 6.25-15 MG/5ML syrup Take 5 mLs by mouth 4 (four) times daily as needed. 03/18/23  Yes Becky Augusta, NP  acetaminophen (TYLENOL) 500 MG tablet Take 500 mg by mouth 2 (two) times daily.    [provider]  albuterol (VENTOLIN HFA) 108 (90 Base) MCG/ACT inhaler INHALE 2 PUFFS EVERY 4 HOURS AS NEEDED FOR WHEEZING FOR SHORTNESS OF BREATH 03/26/20   Salena Saner, MD  ARIPiprazole (ABILIFY) 5 MG tablet Take 10 mg by mouth daily.    [provider]  buPROPion (WELLBUTRIN XL) 150 MG 24 hr tablet Take 150 mg by mouth daily.    [provider]  gabapentin (NEURONTIN) 300 MG capsule Take 300  mg by mouth 3 (three) times daily.    [provider]  oxybutynin (DITROPAN) 5 MG tablet Take 5 mg by mouth every morning.     [provider]  OZEMPIC, 1 MG/DOSE, 4 MG/3ML SOPN INJECT 1 MG UNDER THE SKIN EVERY 7 DAYS 03/24/22   [provider]  pantoprazole (PROTONIX) 40 MG tablet TAKE 1 TABLET (40 MG TOTAL) BY MOUTH EVERY EVENING. 02/21/20 03/29/21  Salena Saner, MD  rosuvastatin (CRESTOR) 10 MG tablet Take 1 tablet (10 mg total) by mouth at bedtime. 10/03/17   Kerman Passey, MD  SYMBICORT 160-4.5 MCG/ACT inhaler Inhale into the lungs. 03/26/21   [provider]  SYNJARDY XR 25-1000 MG TB24 Take 1 tablet by mouth daily.  08/30/19   [provider]  traZODone (DESYREL) 100 MG tablet Take 200 mg by mouth at bedtime.    [provider]    Family History Family History  Problem Relation Age of Onset   Cancer Mother  Esophageal Ca   Asthma Brother    Breast cancer Neg Hx     Social History Social History   Tobacco Use   Smoking status: Former    Current packs/day: 0.00    Average packs/day: 1 pack/day for 30.0 years (30.0 ttl pk-yrs)    Types: E-cigarettes, Cigarettes    Start date: 09/18/1985    Quit date: 09/19/2015    Years since quitting: 7.4   Smokeless tobacco: Never  Vaping Use   Vaping status: Never Used  Substance Use Topics   Alcohol use: Not Currently    Alcohol/week: 0.0 standard drinks of alcohol    Comment: 03/31/2012 sobierty    Drug use: No     Allergies   Codeine, Haldol [haloperidol], Haloperidol lactate, Meloxicam, Trileptal [oxcarbazepine], and Benadryl [diphenhydramine]   Review of Systems Review of Systems  Constitutional:  Positive for appetite change and fever.  HENT:  Positive for congestion, rhinorrhea and sore throat. Negative for ear pain.   Respiratory:  Positive for cough, shortness of breath and wheezing.   Gastrointestinal:  Positive for nausea. Negative for diarrhea and vomiting.      Physical Exam Triage Vital Signs ED Triage Vitals  Encounter Vitals Group     BP      Systolic BP Percentile      Diastolic BP Percentile      Pulse      Resp      Temp      Temp src      SpO2      Weight      Height      Head Circumference      Peak Flow      Pain Score      Pain Loc      Pain Education      Exclude from Growth Chart    No data found.  Updated Vital Signs BP 118/75 (BP Location: Left Arm)   Pulse 88   Temp 99.1 F (37.3 C) (Oral)   Resp 15   Ht 5\' 3"  (1.6 m)   Wt 162 lb (73.5 kg)   SpO2 94%   BMI 28.70 kg/m   Visual Acuity Right Eye Distance:   Left Eye Distance:   Bilateral Distance:    Right Eye Near:   Left Eye Near:    Bilateral Near:     Physical Exam Vitals and nursing note reviewed.  Constitutional:      Appearance: Normal appearance. She is not ill-appearing.  HENT:     Head: Normocephalic and atraumatic.     Right Ear: Tympanic membrane, ear canal and external ear normal. There is no impacted cerumen.     Left Ear: Tympanic membrane, ear canal and external ear normal. There is no impacted cerumen.     Nose: Congestion and rhinorrhea present.     Comments: Nasal mucosa is erythematous and mildly edematous with clear discharge in both nares.    Mouth/Throat:     Mouth: Mucous membranes are moist.     Pharynx: Oropharynx is clear. Posterior oropharyngeal erythema present. No oropharyngeal exudate.     Comments: Erythema to the posterior oropharynx with clear postnasal drip. Cardiovascular:     Rate and Rhythm: Normal rate and regular rhythm.     Pulses: Normal pulses.     Heart sounds: Normal heart sounds. No murmur heard.    No friction rub. No gallop.  Pulmonary:     Effort: Pulmonary effort is normal.  Breath sounds: Wheezing present. No rhonchi or rales.     Comments: Patient has mild expiratory wheezing in the right lower lobe.  Remainder of lung fields are clear. Musculoskeletal:     Cervical back: Normal  range of motion and neck supple.  Lymphadenopathy:     Cervical: No cervical adenopathy.  Skin:    General: Skin is warm and dry.     Capillary Refill: Capillary refill takes less than 2 seconds.     Findings: No rash.  Neurological:     General: No focal deficit present.     Mental Status: She is alert and oriented to person, place, and time.      UC Treatments / Results  Labs (all labs ordered are listed, but only abnormal results are displayed) Labs Reviewed  SARS CORONAVIRUS 2 BY RT PCR - Abnormal; Notable for the following components:      Result Value   SARS Coronavirus 2 by RT PCR POSITIVE (*)    All other components within normal limits    EKG   Radiology No results found.  Procedures Procedures (including critical care time)  Medications Ordered in UC Medications - No data to display  Initial Impression / Assessment and Plan / UC Course  I have reviewed the triage vital signs and the nursing notes.  Pertinent labs & imaging results that were available during my care of the patient were reviewed by me and considered in my medical decision making (see chart for details).   Patient is a nontoxic-appearing 72 year old female presenting after testing COVID-positive at home.  She reports wheezing and shortness of breath though she is able to speak both out dyspnea in full sentences.  Also no tachypnea as a respiratory rate at triage was 15.  Room air oxygen saturation is soft at 94%.  Temperature is elevated 99.1 but not quite a fever.  Given that she tested positive at home I will order a COVID PCR for confirmatory as she does not have a copy of the test with her.  Given her medications there are too many interactions for Paxlovid so I will prescribe her molnupiravir.  We did discuss antiviral therapy along with supportive care.  COVID PCR is positive.  I will discharge patient home with a diagnosis of COVID-19 and start her on molnupiravir twice daily for 5 days.   Also Tylenol and/or ibuprofen as needed for fever or pain.  Atrovent nasal spray for nasal congestion and Tessalon Perles and Promethazine DM cough syrup for cough and congestion.  ER and return precautions reviewed.   Final Clinical Impressions(s) / UC Diagnoses   Final diagnoses:  COVID-19     Discharge Instructions      CDC guidelines state that you must wear a mask for the first 5 days of symptoms when you are around other people.  After 5 days you no longer need to mask as you are no longer considered infectious.  There is no longer need to quarantine unless you have a fever.  If you do have a fever then you need to quarantine until you have been fever free for 24 hours without taking Tylenol and/or ibuprofen.  Use over-the-counter Tylenol and/or ibuprofen according to the package instructions as needed for fever and pain.  Use the Atrovent nasal spray, 2 squirts up each nostril every 6 hours, as needed for nasal congestion and runny nose.  Use the Tessalon Perles every 8 hours during the day as needed for cough.  Take them  with a small sip of water.  You may experience numbness to the base of your tongue or metallic taste in her mouth, this is normal.  Use the Promethazine DM cough syrup at bedtime as needed for cough and congestion.  Be mindful this medication will make you sleepy.  Take the molnupiravir twice daily as prescribed for 5 days.  If you develop any worsening respiratory symptoms such as shortness of breath, shortness of breath at rest, feel as though you cannot catch your breath, you are unable to speak in full sentences, or, as a late sign, your lips begin turning blue you need to call 911 and go to the ER for evaluation.      ED Prescriptions     Medication Sig Dispense Auth. Provider   benzonatate (TESSALON) 100 MG capsule Take 2 capsules (200 mg total) by mouth every 8 (eight) hours. 21 capsule Becky Augusta, NP   ipratropium (ATROVENT) 0.06 % nasal spray  Place 2 sprays into both nostrils 4 (four) times daily. 15 mL Becky Augusta, NP   molnupiravir EUA (LAGEVRIO) 200 mg CAPS capsule Take 4 capsules (800 mg total) by mouth 2 (two) times daily for 5 days. 40 capsule Becky Augusta, NP   promethazine-dextromethorphan (PROMETHAZINE-DM) 6.25-15 MG/5ML syrup Take 5 mLs by mouth 4 (four) times daily as needed. 118 mL Becky Augusta, NP      PDMP not reviewed this encounter.   Becky Augusta, NP 03/18/23 [phone]

## 2023-03-18 NOTE — Discharge Instructions (Addendum)
CDC guidelines state that you must wear a mask for the first 5 days of symptoms when you are around other people.  After 5 days you no longer need to mask as you are no longer considered infectious.  There is no longer need to quarantine unless you have a fever.  If you do have a fever then you need to quarantine until you have been fever free for 24 hours without taking Tylenol and/or ibuprofen.  Use over-the-counter Tylenol and/or ibuprofen according to the package instructions as needed for fever and pain.  Use the Atrovent nasal spray, 2 squirts up each nostril every 6 hours, as needed for nasal congestion and runny nose.  Use the Tessalon Perles every 8 hours during the day as needed for cough.  Take them with a small sip of water.  You may experience numbness to the base of your tongue or metallic taste in her mouth, this is normal.  Use the Promethazine DM cough syrup at bedtime as needed for cough and congestion.  Be mindful this medication will make you sleepy.  Take the molnupiravir twice daily as prescribed for 5 days.  If you develop any worsening respiratory symptoms such as shortness of breath, shortness of breath at rest, feel as though you cannot catch your breath, you are unable to speak in full sentences, or, as a late sign, your lips begin turning blue you need to call 911 and go to the ER for evaluation.

## 2023-03-18 NOTE — ED Triage Notes (Signed)
Patient c/o cough, chest congestion, fever and SOB that started Wed.  Patient took home COVID test yesterday and was positive.

## 2023-04-30 ENCOUNTER — Ambulatory Visit
Admission: EM | Admit: 2023-04-30 | Discharge: 2023-04-30 | Disposition: A | Discharge status: Home / Self Care | Payer: Medicare HMO | Attending: Family Medicine | Admitting: Family Medicine

## 2023-04-30 DIAGNOSIS — J029 Acute pharyngitis, unspecified: Secondary | ICD-10-CM | POA: Insufficient documentation

## 2023-04-30 LAB — GROUP A STREP BY PCR: Group A Strep by PCR: NOT DETECTED

## 2023-04-30 MED ORDER — LIDOCAINE VISCOUS HCL 2 % MT SOLN: 15.0000 mL | OROMUCOSAL | 0 refills | Status: DC | PRN

## 2023-04-30 MED ORDER — LIDOCAINE VISCOUS HCL 2 % MT SOLN
15.0000 mL | Freq: Once | OROMUCOSAL | Status: AC
  Administered 2023-04-30: 15 mL via OROMUCOSAL

## 2023-04-30 NOTE — Discharge Instructions (Addendum)
Your strep test is negative.  Take a COVID antigen test when you get home to be sure you don't have COVID again.    You can take Tylenol and/or Ibuprofen as needed for fever reduction and pain relief.   For sore throat: try warm salt water gargles, Mucinex sore throat cough drops or cepacol lozenges, throat spray, warm tea or water with lemon/honey, popsicles or ice, or OTC cold relief medicine for throat discomfort. You can also purchase chloraseptic spray at the pharmacy or dollar store. Chamomile tea helps too.    It is important to stay hydrated: drink plenty of fluids (water, gatorade/powerade/pedialyte, juices, or teas) to keep your throat moisturized and help further relieve irritation/discomfort.    Return or go to the Emergency Department if symptoms worsen or do not improve in the next few days

## 2023-04-30 NOTE — ED Provider Notes (Signed)
[facility]    CSN: [id number] Arrival date & time: 04/30/23  1406      History   Chief Complaint Chief Complaint  Patient presents with   Sore Throat    HPI Kathryn Ramirez is a 72 y.o. female.   HPI  History obtained from the patient. Kathryn Ramirez presents for sore throat that started this morning.  "This is the worse sore throat that I ever had." Tylenol didn't help. She had COVID in late August.  Says she was still positive on 04/04/23.    No cough, rhinorrhea, vomiting, diarrhea, headache. Has painful swallowing.    Fever : no  Chills: no Sore throat: no   Cough: no Sputum: no Chest tightness: no Shortness of breath: no Wheezing: no  Nasal congestion : no  Rhinorrhea: no Myalgias: no Appetite: normal  Hydration: normal  Abdominal pain: no Nausea: no Vomiting: no Diarrhea: No Rash: No Sleep disturbance: no Headache: no      Past Medical History:  Diagnosis Date   Acid reflux disease    Anemia    Asthma    Bipolar 1 disorder (HCC)    COPD (chronic obstructive pulmonary disease) (HCC)    Diabetes mellitus without complication (HCC)    History of hepatitis C 2010   took interferon x 1 year   Hypertension    Liver hemangioma    Lung nodule    Psoriasis    Psoriatic arthritis (HCC)    Rheumatoid arthritis (HCC)    Tardive dyskinesia    Wears dentures    full upper and lower    Patient Active Problem List   Diagnosis Date Noted   S/P cervical spinal fusion (C5/6 and C6/7) 04/08/2020   Neuroforaminal stenosis of cervical spine (moderate, Left C3/4, C4/5; R C4/5) 04/08/2020   Pain in limb 01/27/2018   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   Erroneous encounter - disregard 03/29/2017   Heartburn    Gastritis without bleeding    Hx of colonic polyps    Arthritis 09/02/2016   Hep C w/o coma, chronic (HCC) 09/02/2016   Hyperlipidemia, unspecified 09/02/2016   Hypertension 09/02/2016   Tardive dyskinesia 09/02/2016   Urinary  incontinence 09/02/2016   History of hypothyroidism 08/19/2016   Neck swelling 08/19/2016   Acute asthma exacerbation 08/19/2016   GERD (gastroesophageal reflux disease) 08/09/2016   Dry mouth 06/01/2016   Elevated antinuclear antibody (ANA) level 06/01/2016   Generalized osteoarthritis of hand 06/01/2016   Degenerative joint disease of right knee 06/01/2016   Vocal cord polyp 05/17/2016   Chest pain 04/06/2016   Drug-induced peripheral neuropathy (HCC) 03/25/2016   Sensory ataxia 03/25/2016   Wrist pain, chronic, left 03/25/2016   Impairment of balance 02/05/2016   Acute right-sided low back pain without sciatica 02/05/2016   Normal blood pressure 01/23/2016   Pulmonary infiltrates    Chronic cough    Pulmonary scarring 01/06/2016   OAB (overactive bladder) 12/18/2015   Asthma 09/18/2015   Multiple pulmonary nodules 09/15/2015   Baker's cyst of knee, right 07/02/2015   Bipolar disorder with moderate depression (HCC) 12/19/2014   Chronic pain 12/19/2014   Carpal tunnel syndrome of left wrist 10/01/2014   Chronic RUQ pain 09/19/2014   Esophageal dysphagia 09/19/2014   Hemangioma of liver 09/19/2014   Arthropathy of cervical facet joint 08/06/2014   Cervical facet joint syndrome 07/12/2014   Cervical myelopathy with cervical radiculopathy (HCC) 03/24/2014   Hyponatremia 02/21/2014   Cervicalgia 09/18/2013   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 09/18/2013    Past Surgical History:  Procedure Laterality Date   CARPAL TUNNEL RELEASE Left 02/21/2020   Procedure: CARPAL TUNNEL RELEASE ENDOSCOPIC;  Surgeon: Christena Flake, MD;  Location: [facility] ORS;  Service: Orthopedics;  Laterality: Left;   CATARACT EXTRACTION W/ INTRAOCULAR LENS  IMPLANT, BILATERAL     CERVICAL SPINE SURGERY  2017   [facility]   CHOLECYSTECTOMY     COLONOSCOPY WITH PROPOFOL N/A 09/14/2016   Procedure: COLONOSCOPY WITH PROPOFOL;  Surgeon: Midge Minium, MD;  Location: [facility]  ENDOSCOPY;  Service: Endoscopy;  Laterality: N/A;   ESOPHAGOGASTRODUODENOSCOPY (EGD) WITH PROPOFOL N/A 09/14/2016   Procedure: ESOPHAGOGASTRODUODENOSCOPY (EGD) WITH PROPOFOL;  Surgeon: Midge Minium, MD;  Location: [facility] ENDOSCOPY;  Service: Endoscopy;  Laterality: N/A;   FLEXIBLE BRONCHOSCOPY N/A 01/13/2016   Procedure: FLEXIBLE BRONCHOSCOPY;  Surgeon: Stephanie Acre, MD;  Location: [facility] ORS;  Service: Cardiopulmonary;  Laterality: N/A;   JOINT REPLACEMENT     NEUROPLASTY MAJOR NERVE     POLYPECTOMY     THROAT SURGERY     TOTAL KNEE ARTHROPLASTY Right 06/15/2017   Procedure: TOTAL KNEE ARTHROPLASTY;  Surgeon: Deeann Saint, MD;  Location: [facility] ORS;  Service: Orthopedics;  Laterality: Right;    OB History   No obstetric history on file.      Home Medications    Prior to Admission medications   Medication Sig Start Date End Date Taking? Authorizing Provider  acetaminophen (TYLENOL) 500 MG tablet Take 500 mg by mouth 2 (two) times daily.   Yes [provider]  albuterol (VENTOLIN HFA) 108 (90 Base) MCG/ACT inhaler INHALE 2 PUFFS EVERY 4 HOURS AS NEEDED FOR WHEEZING FOR SHORTNESS OF BREATH 03/26/20  Yes Salena Saner, MD  ARIPiprazole (ABILIFY) 5 MG tablet Take 10 mg by mouth daily.   Yes [provider]  buPROPion (WELLBUTRIN XL) 150 MG 24 hr tablet Take 150 mg by mouth daily.   Yes [provider]  gabapentin (NEURONTIN) 300 MG capsule Take 300 mg by mouth 3 (three) times daily.   Yes [provider]  oxybutynin (DITROPAN) 5 MG tablet Take 5 mg by mouth every morning.    Yes [provider]  OZEMPIC, 1 MG/DOSE, 4 MG/3ML SOPN INJECT 1 MG UNDER THE SKIN EVERY 7 DAYS 03/24/22  Yes [provider]  pantoprazole (PROTONIX) 40 MG tablet TAKE 1 TABLET (40 MG TOTAL) BY MOUTH EVERY EVENING. 02/21/20 04/30/23 Yes Salena Saner, MD  rosuvastatin (CRESTOR) 10 MG tablet Take 1 tablet (10 mg total) by mouth at bedtime. 10/03/17  Yes Lada, Janit Bern, MD  SYMBICORT 160-4.5 MCG/ACT inhaler Inhale into the lungs. 03/26/21  Yes [provider]  SYNJARDY XR 25-1000 MG TB24 Take 1 tablet by mouth daily.  08/30/19  Yes [provider]  traZODone (DESYREL) 100 MG tablet Take 200 mg by mouth at bedtime.   Yes [provider]  benzonatate (TESSALON) 100 MG capsule Take 2 capsules (200 mg total) by mouth every 8 (eight) hours. 03/18/23   Becky Augusta, NP  ipratropium (ATROVENT) 0.06 % nasal spray Place 2 sprays into both nostrils 4 (four) times daily. 03/18/23   Becky Augusta, NP  promethazine-dextromethorphan (PROMETHAZINE-DM) 6.25-15 MG/5ML syrup Take 5 mLs by mouth 4 (four) times daily as needed. 03/18/23   Becky Augusta, NP    Family History Family History  Problem Relation Age of Onset   Cancer Mother        Esophageal Ca  Asthma Brother    Breast cancer Neg Hx     Social History Social History   Tobacco Use   Smoking status: Former    Current packs/day: 0.00    Average packs/day: 1 pack/day for 30.0 years (30.0 ttl pk-yrs)    Types: E-cigarettes, Cigarettes    Start date: 09/18/1985    Quit date: 09/19/2015    Years since quitting: 7.6   Smokeless tobacco: Never  Vaping Use   Vaping status: Never Used  Substance Use Topics   Alcohol use: Not Currently    Alcohol/week: 0.0 standard drinks of alcohol    Comment: 03/31/2012 sobierty    Drug use: No     Allergies   Celecoxib, Haldol [haloperidol], Haloperidol decanoate, Haloperidol lactate, Meloxicam, Trileptal [oxcarbazepine], Codeine, and Diphenhydramine   Review of Systems Review of Systems: negative unless otherwise stated in HPI.      Physical Exam Triage Vital Signs ED Triage Vitals  Encounter Vitals Group     BP 04/30/23 1418 133/83     Systolic BP Percentile --      Diastolic BP Percentile --      Pulse Rate 04/30/23 1418 71     Resp 04/30/23 1418 16     Temp 04/30/23 1418 98.2 F (36.8 C)     Temp Source 04/30/23 1418 Oral     SpO2  04/30/23 1418 94 %     Weight 04/30/23 1417 162 lb (73.5 kg)     Height 04/30/23 1417 5\' 3"  (1.6 m)     Head Circumference --      Peak Flow --      Pain Score 04/30/23 1421 5     Pain Loc --      Pain Education --      Exclude from Growth Chart --    No data found.  Updated Vital Signs BP 133/83 (BP Location: Left Arm)   Pulse 71   Temp 98.2 F (36.8 C) (Oral)   Resp 16   Ht 5\' 3"  (1.6 m)   Wt 73.5 kg   SpO2 94%   BMI 28.70 kg/m   Visual Acuity Right Eye Distance:   Left Eye Distance:   Bilateral Distance:    Right Eye Near:   Left Eye Near:    Bilateral Near:     Physical Exam GEN:     alert, non-toxic appearing elderly female in no distress    HENT:  mucus membranes moist, oropharyngeal without lesions, mild erythema, no tonsillar hypertrophy or exudates, no nasal discharge, bilateral TM normal EYES:   pupils equal and reactive, no scleral injection or discharge RESP:  no increased work of breathing Skin:   warm and dry    UC Treatments / Results  Labs (all labs ordered are listed, but only abnormal results are displayed) Labs Reviewed  GROUP A STREP BY PCR    EKG   Radiology No results found.  Procedures Procedures (including critical care time)  Medications Ordered in UC Medications - No data to display  Initial Impression / Assessment and Plan / UC Course  I have reviewed the triage vital signs and the nursing notes.  Pertinent labs & imaging results that were available during my care of the patient were reviewed by me and considered in my medical decision making (see chart for details).       Pt is a 72 y.o. female who presents for 1 day of sore throat. Jayle is afebrile here. Satting well on room  air. Overall pt is non-toxic appearing, well hydrated, without respiratory distress.    Pulmonary exam ***is unremarkable.  COVID testing obtained ***and was negative. ***Pt to quarantine until COVID test results or longer if positive.  I  will call patient with test results, if positive. History consistent with ***viral respiratory illness. Discussed symptomatic treatment.  Explained lack of efficacy of antibiotics in viral disease.  Typical duration of symptoms discussed.   Return and ED precautions given and voiced understanding. Discussed MDM, treatment plan and plan for follow-up with patient*** who agrees with plan.     Final Clinical Impressions(s) / UC Diagnoses   Final diagnoses:  None   Discharge Instructions   None    ED Prescriptions   None    PDMP not reviewed this encounter.

## 2023-04-30 NOTE — ED Triage Notes (Signed)
Pt c/o sore throat since this AM. Denies any fevers. Has tried tylenol w/o relief.

## 2023-07-28 ENCOUNTER — Ambulatory Visit (INDEPENDENT_AMBULATORY_CARE_PROVIDER_SITE_OTHER): Payer: 59

## 2023-07-28 ENCOUNTER — Encounter: Payer: Self-pay | Admitting: Emergency Medicine

## 2023-07-28 ENCOUNTER — Inpatient Hospital Stay
Admission: EM | Admit: 2023-07-28 | Discharge: 2023-07-30 | DRG: 178 | Disposition: A | Discharge status: Home / Self Care | Payer: 59 | Attending: Osteopathic Medicine | Admitting: Osteopathic Medicine

## 2023-07-28 ENCOUNTER — Other Ambulatory Visit: Payer: Self-pay

## 2023-07-28 ENCOUNTER — Ambulatory Visit
Admission: EM | Admit: 2023-07-28 | Discharge: 2023-07-28 | Disposition: A | Discharge status: Other Acute Inpatient Hospital | Payer: 59 | Source: Home / Self Care

## 2023-07-28 DIAGNOSIS — K219 Gastro-esophageal reflux disease without esophagitis: Secondary | ICD-10-CM | POA: Diagnosis present

## 2023-07-28 DIAGNOSIS — R0602 Shortness of breath: Secondary | ICD-10-CM | POA: Insufficient documentation

## 2023-07-28 DIAGNOSIS — R112 Nausea with vomiting, unspecified: Secondary | ICD-10-CM | POA: Diagnosis present

## 2023-07-28 DIAGNOSIS — I2489 Other forms of acute ischemic heart disease: Secondary | ICD-10-CM | POA: Diagnosis present

## 2023-07-28 DIAGNOSIS — Z888 Allergy status to other drugs, medicaments and biological substances status: Secondary | ICD-10-CM

## 2023-07-28 DIAGNOSIS — I341 Nonrheumatic mitral (valve) prolapse: Secondary | ICD-10-CM | POA: Diagnosis present

## 2023-07-28 DIAGNOSIS — U071 COVID-19: Principal | ICD-10-CM

## 2023-07-28 DIAGNOSIS — Z800 Family history of malignant neoplasm of digestive organs: Secondary | ICD-10-CM | POA: Diagnosis not present

## 2023-07-28 DIAGNOSIS — I10 Essential (primary) hypertension: Secondary | ICD-10-CM | POA: Diagnosis present

## 2023-07-28 DIAGNOSIS — L409 Psoriasis, unspecified: Secondary | ICD-10-CM | POA: Diagnosis present

## 2023-07-28 DIAGNOSIS — I214 Non-ST elevation (NSTEMI) myocardial infarction: Secondary | ICD-10-CM

## 2023-07-28 DIAGNOSIS — B182 Chronic viral hepatitis C: Secondary | ICD-10-CM | POA: Diagnosis present

## 2023-07-28 DIAGNOSIS — E785 Hyperlipidemia, unspecified: Secondary | ICD-10-CM | POA: Diagnosis present

## 2023-07-28 DIAGNOSIS — F319 Bipolar disorder, unspecified: Secondary | ICD-10-CM | POA: Diagnosis present

## 2023-07-28 DIAGNOSIS — E66811 Obesity, class 1: Secondary | ICD-10-CM | POA: Diagnosis present

## 2023-07-28 DIAGNOSIS — Z825 Family history of asthma and other chronic lower respiratory diseases: Secondary | ICD-10-CM

## 2023-07-28 DIAGNOSIS — Z7951 Long term (current) use of inhaled steroids: Secondary | ICD-10-CM

## 2023-07-28 DIAGNOSIS — J4489 Other specified chronic obstructive pulmonary disease: Secondary | ICD-10-CM | POA: Diagnosis present

## 2023-07-28 DIAGNOSIS — E871 Hypo-osmolality and hyponatremia: Secondary | ICD-10-CM | POA: Diagnosis present

## 2023-07-28 DIAGNOSIS — I2510 Atherosclerotic heart disease of native coronary artery without angina pectoris: Secondary | ICD-10-CM | POA: Diagnosis present

## 2023-07-28 DIAGNOSIS — Z7982 Long term (current) use of aspirin: Secondary | ICD-10-CM

## 2023-07-28 DIAGNOSIS — E119 Type 2 diabetes mellitus without complications: Secondary | ICD-10-CM | POA: Diagnosis present

## 2023-07-28 DIAGNOSIS — Z8616 Personal history of COVID-19: Secondary | ICD-10-CM

## 2023-07-28 DIAGNOSIS — M069 Rheumatoid arthritis, unspecified: Secondary | ICD-10-CM | POA: Diagnosis present

## 2023-07-28 DIAGNOSIS — L4050 Arthropathic psoriasis, unspecified: Secondary | ICD-10-CM | POA: Diagnosis present

## 2023-07-28 DIAGNOSIS — Z886 Allergy status to analgesic agent status: Secondary | ICD-10-CM

## 2023-07-28 DIAGNOSIS — Z885 Allergy status to narcotic agent status: Secondary | ICD-10-CM

## 2023-07-28 DIAGNOSIS — I252 Old myocardial infarction: Secondary | ICD-10-CM

## 2023-07-28 DIAGNOSIS — Z87891 Personal history of nicotine dependence: Secondary | ICD-10-CM | POA: Diagnosis not present

## 2023-07-28 DIAGNOSIS — Z96651 Presence of right artificial knee joint: Secondary | ICD-10-CM | POA: Diagnosis present

## 2023-07-28 DIAGNOSIS — Z7985 Long-term (current) use of injectable non-insulin antidiabetic drugs: Secondary | ICD-10-CM

## 2023-07-28 DIAGNOSIS — Z122 Encounter for screening for malignant neoplasm of respiratory organs: Secondary | ICD-10-CM

## 2023-07-28 DIAGNOSIS — Z79899 Other long term (current) drug therapy: Secondary | ICD-10-CM

## 2023-07-28 DIAGNOSIS — F419 Anxiety disorder, unspecified: Secondary | ICD-10-CM | POA: Diagnosis present

## 2023-07-28 DIAGNOSIS — Z6832 Body mass index (BMI) 32.0-32.9, adult: Secondary | ICD-10-CM | POA: Diagnosis not present

## 2023-07-28 DIAGNOSIS — G2401 Drug induced subacute dyskinesia: Secondary | ICD-10-CM | POA: Diagnosis present

## 2023-07-28 LAB — CBC WITH DIFFERENTIAL/PLATELET
Abs Immature Granulocytes: 0.02 10*3/uL (ref 0.00–0.07)
Basophils Absolute: 0.1 10*3/uL (ref 0.0–0.1)
Basophils Relative: 1 %
Eosinophils Absolute: 0.2 10*3/uL (ref 0.0–0.5)
Eosinophils Relative: 1 %
HCT: 37.2 % (ref 36.0–46.0)
Hemoglobin: 11.4 g/dL — ABNORMAL LOW (ref 12.0–15.0)
Immature Granulocytes: 0 %
Lymphocytes Relative: 17 %
Lymphs Abs: 1.8 10*3/uL (ref 0.7–4.0)
MCH: 23.8 pg — ABNORMAL LOW (ref 26.0–34.0)
MCHC: 30.6 g/dL (ref 30.0–36.0)
MCV: 77.5 fL — ABNORMAL LOW (ref 80.0–100.0)
Monocytes Absolute: 0.5 10*3/uL (ref 0.1–1.0)
Monocytes Relative: 5 %
Neutro Abs: 8 10*3/uL — ABNORMAL HIGH (ref 1.7–7.7)
Neutrophils Relative %: 76 %
Platelets: 324 10*3/uL (ref 150–400)
RBC: 4.8 MIL/uL (ref 3.87–5.11)
RDW: 15.4 % (ref 11.5–15.5)
WBC: 10.5 10*3/uL (ref 4.0–10.5)
nRBC: 0 % (ref 0.0–0.2)

## 2023-07-28 LAB — TROPONIN I (HIGH SENSITIVITY)
Troponin I (High Sensitivity): 85 ng/L — ABNORMAL HIGH (ref <18)
Troponin I (High Sensitivity): 87 ng/L — ABNORMAL HIGH (ref <18)
Troponin I (High Sensitivity): 101 ng/L (ref <18)

## 2023-07-28 LAB — COMPREHENSIVE METABOLIC PANEL
ALT: 17 U/L (ref 0–44)
AST: 18 U/L (ref 15–41)
Albumin: 3.9 g/dL (ref 3.5–5.0)
Alkaline Phosphatase: 44 U/L (ref 38–126)
Anion gap: 9 (ref 5–15)
BUN: 17 mg/dL (ref 8–23)
CO2: 24 mmol/L (ref 22–32)
Calcium: 9 mg/dL (ref 8.9–10.3)
Chloride: 101 mmol/L (ref 98–111)
Creatinine, Ser: 0.72 mg/dL (ref 0.44–1.00)
GFR, Estimated: >60 mL/min (ref >60)
Glucose, Bld: 91 mg/dL (ref 70–99)
Potassium: 4.1 mmol/L (ref 3.5–5.1)
Sodium: 134 mmol/L — ABNORMAL LOW (ref 135–145)
Total Bilirubin: 0.8 mg/dL (ref 0.0–1.2)
Total Protein: 7.5 g/dL (ref 6.5–8.1)

## 2023-07-28 LAB — PROTIME-INR
INR: 1.1 (ref 0.8–1.2)
Prothrombin Time: 14.1 s (ref 11.4–15.2)

## 2023-07-28 LAB — RESP PANEL BY RT-PCR (FLU A&B, COVID) ARPGX2
Influenza A by PCR: NEGATIVE
Influenza B by PCR: NEGATIVE
SARS Coronavirus 2 by RT PCR: NEGATIVE

## 2023-07-28 LAB — CBG MONITORING, ED
Glucose-Capillary: 83 mg/dL (ref 70–99)
Glucose-Capillary: 86 mg/dL (ref 70–99)

## 2023-07-28 LAB — CK: Total CK: 132 U/L (ref 38–234)

## 2023-07-28 LAB — SARS CORONAVIRUS 2 BY RT PCR: SARS Coronavirus 2 by RT PCR: POSITIVE — AB

## 2023-07-28 LAB — TSH: TSH: 0.913 u[IU]/mL (ref 0.350–4.500)

## 2023-07-28 LAB — APTT: aPTT: 29 s (ref 24–36)

## 2023-07-28 MED ORDER — HEPARIN (PORCINE) 25000 UT/250ML-% IV SOLN
1100.0000 [IU]/h | INTRAVENOUS | Status: DC
  Administered 2023-07-28: 850 [IU]/h via INTRAVENOUS
  Filled 2023-07-28: qty 250

## 2023-07-28 MED ORDER — ONDANSETRON HCL 4 MG/2ML IJ SOLN: 4.0000 mg | Freq: Four times a day (QID) | INTRAMUSCULAR | Status: DC | PRN

## 2023-07-28 MED ORDER — INSULIN ASPART 100 UNIT/ML IJ SOLN
0.0000 [IU] | Freq: Three times a day (TID) | INTRAMUSCULAR | Status: DC
Start: 2023-07-28 — End: 2023-07-29

## 2023-07-28 MED ORDER — ACETAMINOPHEN 325 MG PO TABS
650.0000 mg | ORAL_TABLET | ORAL | Status: DC | PRN
  Administered 2023-07-28 – 2023-07-30 (×2): 650 mg via ORAL
  Filled 2023-07-28 (×2): qty 2

## 2023-07-28 MED ORDER — HEPARIN BOLUS VIA INFUSION
4000.0000 [IU] | Freq: Once | INTRAVENOUS | Status: AC
Start: 2023-07-28 — End: 2023-07-28
  Administered 2023-07-28: 4000 [IU] via INTRAVENOUS
  Filled 2023-07-28: qty 4000

## 2023-07-28 MED ORDER — TRAZODONE HCL 100 MG PO TABS
200.0000 mg | ORAL_TABLET | Freq: Every day | ORAL | Status: DC
  Administered 2023-07-28 – 2023-07-29 (×2): 200 mg via ORAL
  Filled 2023-07-28 (×2): qty 2

## 2023-07-28 MED ORDER — ASPIRIN 81 MG PO TBEC
81.0000 mg | DELAYED_RELEASE_TABLET | Freq: Every day | ORAL | Status: DC
  Administered 2023-07-29 – 2023-07-30 (×2): 81 mg via ORAL
  Filled 2023-07-28 (×2): qty 1

## 2023-07-28 MED ORDER — MOMETASONE FURO-FORMOTEROL FUM 200-5 MCG/ACT IN AERO
2.0000 | INHALATION_SPRAY | Freq: Two times a day (BID) | RESPIRATORY_TRACT | Status: DC
  Filled 2023-07-28: qty 8.8

## 2023-07-28 MED ORDER — OXYBUTYNIN CHLORIDE 5 MG PO TABS
5.0000 mg | ORAL_TABLET | Freq: Every day | ORAL | Status: DC
  Administered 2023-07-29 – 2023-07-30 (×2): 5 mg via ORAL
  Filled 2023-07-28 (×2): qty 1

## 2023-07-28 MED ORDER — ROSUVASTATIN CALCIUM 10 MG PO TABS: 10.0000 mg | ORAL_TABLET | Freq: Every day | ORAL | Status: DC

## 2023-07-28 MED ORDER — NITROGLYCERIN 0.4 MG SL SUBL: 0.4000 mg | SUBLINGUAL_TABLET | SUBLINGUAL | Status: DC | PRN

## 2023-07-28 MED ORDER — ARIPIPRAZOLE 10 MG PO TABS
10.0000 mg | ORAL_TABLET | Freq: Every day | ORAL | Status: DC
  Administered 2023-07-29 – 2023-07-30 (×2): 10 mg via ORAL
  Filled 2023-07-28 (×2): qty 1

## 2023-07-28 MED ORDER — ROSUVASTATIN CALCIUM 10 MG PO TABS
40.0000 mg | ORAL_TABLET | Freq: Every day | ORAL | Status: DC
  Administered 2023-07-28 – 2023-07-29 (×2): 40 mg via ORAL
  Filled 2023-07-28: qty 2
  Filled 2023-07-28: qty 4

## 2023-07-28 MED ORDER — PANTOPRAZOLE SODIUM 40 MG PO TBEC
40.0000 mg | DELAYED_RELEASE_TABLET | Freq: Every evening | ORAL | Status: DC
  Administered 2023-07-29: 40 mg via ORAL
  Filled 2023-07-28: qty 1

## 2023-07-28 MED ORDER — ALBUTEROL SULFATE HFA 108 (90 BASE) MCG/ACT IN AERS: 2.0000 | INHALATION_SPRAY | RESPIRATORY_TRACT | Status: DC | PRN

## 2023-07-28 MED ORDER — EMPAGLIFLOZIN-METFORMIN HCL ER 25-1000 MG PO TB24: 1.0000 | ORAL_TABLET | Freq: Every day | ORAL | Status: DC

## 2023-07-28 MED ORDER — ACETAMINOPHEN 500 MG PO TABS: 500.0000 mg | ORAL_TABLET | Freq: Two times a day (BID) | ORAL | Status: DC

## 2023-07-28 MED ORDER — BUPROPION HCL ER (XL) 150 MG PO TB24
150.0000 mg | ORAL_TABLET | Freq: Every day | ORAL | Status: DC
  Administered 2023-07-29 – 2023-07-30 (×2): 150 mg via ORAL
  Filled 2023-07-28 (×2): qty 1

## 2023-07-28 NOTE — ED Notes (Signed)
 EMS HAS BEEN CALLED FOR PATIENT TRANSPORT.

## 2023-07-28 NOTE — ED Provider Notes (Addendum)
 [facility]    CSN: [id number] Arrival date & time: 07/28/23  1131      History   Chief Complaint Chief Complaint  Patient presents with   Chest Pain   Shortness of Breath    HPI Kathryn Ramirez is a 73 y.o. female.   HPI  73 year old female with a past medical history significant for diabetes, COPD, bipolar 1, asthma, anemia, GERD, hypertension, hepatitis C, and tardive dyskinesia who presents for evaluation of chest pain, shortness breath, nausea, and vomiting that started 1 hour ago.  She reports that the chest pain was substernal and radiated through to her back and lasted for a short duration but she is unsure of exactly how long.  She had 3 episodes of vomiting that has resolved with oral Zofran , and she continues to complain of shortness of breath.  She also reports palpitations with a heart rate in the 130s on her home monitor.  She denies any fever, runny nose, nasal congestion, sweating, or cough.  She is complaining that her hands are numb.  The patient took 324 mg of baby aspirin  prior to arrival.  Past Medical History:  Diagnosis Date   Acid reflux disease    Anemia    Asthma    Bipolar 1 disorder (HCC)    COPD (chronic obstructive pulmonary disease) (HCC)    Diabetes mellitus without complication (HCC)    History of hepatitis C 2010   took interferon x 1 year   Hypertension    Liver hemangioma    Lung nodule    Psoriasis    Psoriatic arthritis (HCC)    Rheumatoid arthritis (HCC)    Tardive dyskinesia    Wears dentures    full upper and lower    Patient Active Problem List   Diagnosis Date Noted   S/P cervical spinal fusion (C5/6 and C6/7) 04/08/2020   Neuroforaminal stenosis of cervical spine (moderate, Left C3/4, C4/5; R C4/5) 04/08/2020   Pain in limb 01/27/2018   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   Erroneous encounter - disregard 03/29/2017   Heartburn    Gastritis without bleeding    Hx of colonic polyps    Arthritis  09/02/2016   Hep C w/o coma, chronic (HCC) 09/02/2016   Hyperlipidemia, unspecified 09/02/2016   Hypertension 09/02/2016   Tardive dyskinesia 09/02/2016   Urinary incontinence 09/02/2016   History of hypothyroidism 08/19/2016   Neck swelling 08/19/2016   Acute asthma exacerbation 08/19/2016   GERD (gastroesophageal reflux disease) 08/09/2016   Dry mouth 06/01/2016   Elevated antinuclear antibody (ANA) level 06/01/2016   Generalized osteoarthritis of hand 06/01/2016   Degenerative joint disease of right knee 06/01/2016   Vocal cord polyp 05/17/2016   Chest pain 04/06/2016   Drug-induced peripheral neuropathy (HCC) 03/25/2016   Sensory ataxia 03/25/2016   Wrist pain, chronic, left 03/25/2016   Impairment of balance 02/05/2016   Acute right-sided low back pain without sciatica 02/05/2016   Normal blood pressure 01/23/2016   Pulmonary infiltrates    Chronic cough    Pulmonary scarring 01/06/2016   OAB (overactive bladder) 12/18/2015   Asthma 09/18/2015   Multiple pulmonary nodules 09/15/2015   Baker's cyst of knee, right 07/02/2015   Bipolar disorder with moderate depression (HCC) 12/19/2014   Chronic pain 12/19/2014   Carpal tunnel syndrome of left wrist 10/01/2014   Chronic RUQ pain 09/19/2014   Esophageal dysphagia 09/19/2014   Hemangioma of liver 09/19/2014   Arthropathy of cervical facet joint 08/06/2014  Cervical facet joint syndrome 07/12/2014   Cervical myelopathy with cervical radiculopathy (HCC) 03/24/2014   Hyponatremia 02/21/2014   Cervicalgia 09/18/2013   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 09/18/2013    Past Surgical History:  Procedure Laterality Date   CARPAL TUNNEL RELEASE Left 02/21/2020   Procedure: CARPAL TUNNEL RELEASE ENDOSCOPIC;  Surgeon: Edie Norleen PARAS, MD;  Location: [facility] ORS;  Service: Orthopedics;  Laterality: Left;   CATARACT EXTRACTION W/ INTRAOCULAR LENS  IMPLANT, BILATERAL     CERVICAL SPINE SURGERY  2017    [facility]   CHOLECYSTECTOMY     COLONOSCOPY WITH PROPOFOL  N/A 09/14/2016   Procedure: COLONOSCOPY WITH PROPOFOL ;  Surgeon: Rogelia Copping, MD;  Location: [facility] ENDOS[facility];  Service: Endoscopy;  Laterality: N/A;   ESOPHAGOGASTRODUODENOSCOPY (EGD) WITH PROPOFOL  N/A 09/14/2016   Procedure: ESOPHAGOGASTRODUODENOSCOPY (EGD) WITH PROPOFOL ;  Surgeon: Rogelia Copping, MD;  Location: [facility] END[facility];  Service: Endoscopy;  Laterality: N/A;   FLEXIBLE BRONCHOSCOPY N/A 01/13/2016   Procedure: FLEXIBLE BRONCHOSCOPY;  Surgeon: Jorie Cha, MD;  Location: [facility] OR[facility];  Service: Cardiopulmonary;  Laterality: N/A;   JOINT REPLACEMENT     NEUROPLASTY MAJOR NERVE     POLYPECTOMY     THROAT SURGERY     TOTAL KNEE ARTHROPLASTY Right 06/15/2017   Procedure: TOTAL KNEE ARTHROPLASTY;  Surgeon: Cleotilde Barrio, MD;  Location: [facility] OR[facility];  Service: Orthopedics;  Laterality: Right;    OB History   No obstetric history on file.      Home Medications    Prior to Admission medications   Medication Sig Start Date End Date Taking? Authorizing Provider  ARIPiprazole  (ABILIFY ) 5 MG tablet Take 10 mg by mouth daily.   Yes [provider]  buPROPion  (WELLBUTRIN  XL) 150 MG 24 hr tablet Take 150 mg by mouth daily.   Yes [provider]  oxybutynin  (DITROPAN ) 5 MG tablet Take 5 mg by mouth every morning.    Yes [provider]  OZEMPIC, 1 MG/DOSE, 4 MG/3ML SOPN INJECT 1 MG UNDER THE SKIN EVERY 7 DAYS 03/24/22  Yes [provider]  rosuvastatin  (CRESTOR ) 10 MG tablet Take 1 tablet (10 mg total) by mouth at bedtime. 10/03/17  Yes Lada, Newell SQUIBB, MD  SYMBICORT 160-4.5 MCG/ACT inhaler Inhale into the lungs. 03/26/21  Yes [provider]  SYNJARDY  XR 25-1000 MG TB24 Take 1 tablet by mouth daily.  08/30/19  Yes [provider]  traZODone  (DESYREL ) 100 MG tablet Take 200 mg by mouth at bedtime.   Yes [provider]  acetaminophen  (TYLENOL ) 500 MG tablet Take 500 mg by mouth 2  (two) times daily.    [provider]  albuterol  (VENTOLIN  HFA) 108 (90 Base) MCG/ACT inhaler INHALE 2 PUFFS EVERY 4 HOURS AS NEEDED FOR WHEEZING FOR SHORTNESS OF BREATH 03/26/20   Tamea Dedra CROME, MD  gabapentin  (NEURONTIN ) 300 MG capsule Take 300 mg by mouth 3 (three) times daily. Patient not taking: Reported on 07/28/2023    [provider]  lidocaine  (XYLOCAINE ) 2 % solution Use as directed 15 mLs in the mouth or throat as needed for mouth pain. 04/30/23   Brimage, Vondra, DO  pantoprazole  (PROTONIX ) 40 MG tablet TAKE 1 TABLET (40 MG TOTAL) BY MOUTH EVERY EVENING. 02/21/20 04/30/23  Tamea Dedra CROME, MD    Family History Family History  Problem Relation Age of Onset   Cancer Mother        Esophageal Ca   Asthma Brother    Breast cancer Neg Hx  Social History Social History   Tobacco Use   Smoking status: Former    Current packs/day: 0.00    Average packs/day: 1 pack/day for 30.0 years (30.0 ttl pk-yrs)    Types: E-cigarettes, Cigarettes    Start date: 09/18/1985    Quit date: 09/19/2015    Years since quitting: 7.8   Smokeless tobacco: Never  Vaping Use   Vaping status: Never Used  Substance Use Topics   Alcohol use: Not Currently    Alcohol/week: 0.0 standard drinks of alcohol    Comment: 03/31/2012 sobierty    Drug use: No     Allergies   Celecoxib , Haldol [haloperidol], Haloperidol decanoate, Haloperidol lactate, Meloxicam , Trileptal [oxcarbazepine], Codeine, and Diphenhydramine    Review of Systems Review of Systems  Constitutional:  Negative for diaphoresis, fatigue and fever.  HENT:  Negative for congestion, ear pain and rhinorrhea.   Respiratory:  Positive for shortness of breath. Negative for cough.   Cardiovascular:  Positive for chest pain.  Gastrointestinal:  Positive for nausea and vomiting.  Neurological:  Negative for syncope.     Physical Exam Triage Vital Signs ED Triage Vitals  Encounter Vitals Group     BP      Systolic  BP Percentile      Diastolic BP Percentile      Pulse      Resp      Temp      Temp src      SpO2      Weight      Height      Head Circumference      Peak Flow      Pain Score      Pain Loc      Pain Education      Exclude from Growth Chart    No data found.  Updated Vital Signs BP (!) 162/75 (BP Location: Left Arm)   Pulse 89   Temp 98.2 F (36.8 C) (Oral)   Resp 18   SpO2 98%   Visual Acuity Right Eye Distance:   Left Eye Distance:   Bilateral Distance:    Right Eye Near:   Left Eye Near:    Bilateral Near:     Physical Exam Vitals and nursing note reviewed.  Constitutional:      Appearance: Normal appearance. She is not ill-appearing.  HENT:     Head: Normocephalic and atraumatic.  Cardiovascular:     Rate and Rhythm: Normal rate and regular rhythm.     Pulses: Normal pulses.     Heart sounds: Normal heart sounds. No murmur heard.    No friction rub. No gallop.  Pulmonary:     Effort: Pulmonary effort is normal.     Breath sounds: Normal breath sounds. No wheezing, rhonchi or rales.  Skin:    General: Skin is warm and dry.     Capillary Refill: Capillary refill takes less than 2 seconds.     Findings: No rash.  Neurological:     General: No focal deficit present.     Mental Status: She is alert and oriented to person, place, and time.      UC Treatments / Results  Labs (all labs ordered are listed, but only abnormal results are displayed) Labs Reviewed  CBC WITH DIFFERENTIAL/PLATELET - Abnormal; Notable for the following components:      Result Value   Hemoglobin 11.4 (*)    MCV 77.5 (*)    MCH 23.8 (*)    Neutro Abs  8.0 (*)    All other components within normal limits  COMPREHENSIVE METABOLIC PANEL - Abnormal; Notable for the following components:   Sodium 134 (*)    All other components within normal limits  TROPONIN I (HIGH SENSITIVITY) - Abnormal; Notable for the following components:   Troponin I (High Sensitivity) 85 (*)    All  other components within normal limits  RESP PANEL BY RT-PCR (FLU A&B, COVID) ARPGX2    EKG Normal sinus rhythm with a ventricular rate of 70 bpm Peer interval 166 ms QRS duration 80 ms QT/QTc 392/446 ms No ST or T wave abnormalities noted.   Radiology No results found.  Procedures Procedures (including critical care time)  Medications Ordered in UC Medications - No data to display  Initial Impression / Assessment and Plan / UC Course  I have reviewed the triage vital signs and the nursing notes.  Pertinent labs & imaging results that were available during my care of the patient were reviewed by me and considered in my medical decision making (see chart for details).   Patient is a nontoxic-appearing 73 year old female presenting for evaluation of chest pain, shortness breath, nausea, vomiting, and heart racing that started approximately an hour ago.  All symptoms have resolved with the exception of shortness of breath.  In the exam room she is able to speak in full sentence without dyspnea or tachypnea.  Room air oxygen saturation is 98%.  Heart rate is currently 90, BP 183/96, she is afebrile with an oral temp of 98.2.  She denies any URI symptoms or cough.  Cardiopulmonary exam reveals S1-S2 heart sounds with regular rate and rhythm and lung sounds are clear to auscultation all fields.  Differential diagnosis include ACS, COVID, influenza, arrhythmia.  I will check CBC, CMP, troponin, EKG, respiratory panel for COVID or flu, and chest x-ray today for any acute cardiopulmonary process.  EKG shows normal sinus rhythm without ST or T wave abnormalities.  Unchanged when compared to EKG from 07/28/2022.  Chest x-ray independently reviewed and evaluated by me.  Impression: Lung fields are well aerated without evidence of infiltrate or effusion.  Cardiomediastinal silhouette appears normal.  Radiology overread is pending. Radiology impression states no active cardiopulmonary  disease.  Troponin is elevated at 85.  Respiratory panel is negative for COVID or influenza.  CMP shows mild hyponatremia with a sodium of 134 but is otherwise unremarkable.  Due to the patient's elevated troponin I am going to send her to the emergency department by EMS for further evaluation and to rule out possible ACS.  I have called and spoken to [facility], the charge nurse at [facility] ER.  I will have staff inserted peripheral IV and call EMS.  Report given to [facility] EMS paramedic and care transferred.   Final Clinical Impressions(s) / UC Diagnoses   Final diagnoses:  Shortness of breath  NSTEMI (non-ST elevated myocardial infarction) [facility])     Discharge Instructions      Please go to the emergency department [facility] via EMS for further evaluation of your chest pain and shortness of breath.     ED Prescriptions   None    PDMP not reviewed this encounter.   Bernardino Ditch, NP 07/28/23 1304    Bernardino Ditch, NP 07/28/23 [phone]

## 2023-07-28 NOTE — Progress Notes (Signed)
 PHARMACY - ANTICOAGULATION CONSULT NOTE  Pharmacy Consult for Heparin  Infusion Indication: chest pain/ACS  Allergies  Allergen Reactions   Celecoxib  Other (See Comments)   Haldol [Haloperidol] Other (See Comments)    Reaction: Pt felt like her neck was going to snap. Sever neck stiffness. Reaction: Pt felt like her neck was going to snap.   Haloperidol Decanoate Other (See Comments)   Haloperidol Lactate Other (See Comments)    Sever neck stiffness.   Meloxicam  Other (See Comments)    GERD   Trileptal [Oxcarbazepine] Swelling   Codeine Nausea Only, Rash and Other (See Comments)   Diphenhydramine  Palpitations and Other (See Comments)    Patient Measurements: Height: 5' 3 (160 cm) Weight: 83.2 kg (183 lb 6.4 oz) IBW/kg (Calculated) : 52.4 Heparin  Dosing Weight: 70.8 kg  Vital Signs: Temp: 97.9 F (36.6 C) (01/02 1334) Temp Source: Oral (01/02 1334) BP: 154/94 (01/02 1334) Pulse Rate: 71 (01/02 1334)  Labs: Recent Labs    07/28/23 1149  HGB 11.4*  HCT 37.2  PLT 324  CREATININE 0.72  TROPONINIHS 85*    Estimated Creatinine Clearance: 65.9 mL/min (by C-G formula based on SCr of 0.72 mg/dL).   Medical History: Past Medical History:  Diagnosis Date   Acid reflux disease    Anemia    Asthma    Bipolar 1 disorder (HCC)    COPD (chronic obstructive pulmonary disease) (HCC)    Diabetes mellitus without complication (HCC)    History of hepatitis C 2010   took interferon x 1 year   Hypertension    Liver hemangioma    Lung nodule    Psoriasis    Psoriatic arthritis (HCC)    Rheumatoid arthritis (HCC)    Tardive dyskinesia    Wears dentures    full upper and lower    Medications:  No anticoagulation use at home  Assessment: Patient is a 73 year old female with a past medical history of diabetes, COPD, bipolar 1, asthma, anemia, GERD, HTN, hepatitis C and tardive dyskinesia who presents to chest pain, SOB, nausea, and vomiting. She initially presented  to an urgent care and was brought via EMS to the ED due to elevated troponin of 85. Patient reported taking a baby aspirin  and zofran  at home. Pharmacy was consulted to initiate patient on a heparin  infusion for ACS/STEMI. Patient was not on anticoagulation at home, therefore we can monitor via heparin  levels.   Baseline INR and aPTT levels ordered.  No signs/symptoms of bleeding noted in chart. Hgb 11.4. PLT 324.  Goal of Therapy:  Heparin  level 0.3-0.7 units/ml Monitor platelets by anticoagulation protocol: Yes   Plan:  Give 4000 unit bolus x 1 Start heparin  infusion at a rate of 850 units/hr Check heparin  level in 8 hours Monitor CBC daily  Lum VEAR Mania, PharmD Clinical Pharmacist 07/28/2023,2:20 PM

## 2023-07-28 NOTE — H&P (Signed)
 History and Physical    Kathryn Ramirez FMW:[id number] DOB: Dec 07, 1950 DOA: 07/28/2023  PCP: [facility] (Confirm with patient/family/NH records and if not entered, this has to be entered at [facility] point of entry) Patient coming from: [facility]  I have personally briefly reviewed patient's old medical records in [facility] Link  Chief Complaint: Chest pain  HPI: Kathryn Ramirez is a 73 y.o. female with medical history significant of asthma/COPD, HTN, IIDM, RA, tardive dyskinesia, chronic hepatitis C status post interferon treatment, GERD, presented with new onset of chest pain.  Patient was at rest this morning and suddenly developed chest pain associate with nausea and vomited 1 time, described the chest pain as something rising up in the chest radiating to left-sided jaw and left forefingers, she was able to take 4 aspirins however her throat up again, minutes later but chest pain subsided.  Denied any shortness of breath palpitations.  Subsequently, she developed another 2 episodes of similar chest pains and decided to come to the [facility], where her trop was found to be elevated at 85 and sent to [facility] ED.  She reported that she never had such chest pain before.  Reports that she does not have any new medication on board.   ED Course: Afebrile, nontachycardic blood pressure 150/90 nonhypoxic.  Chest x-ray showed no acute infiltrates.  EKG showed sinus rhythm, no acute ST changes.  Blood work showed troponin trending 87> 85, creatinine 0.7 bicarb 24 CBC, WBC 10.5, hemoglobin 10.4.  Patient was started on heparin  drip in the ED  Review of Systems: As per HPI otherwise 14 point review of systems negative.    Past Medical History:  Diagnosis Date   Acid reflux disease    Anemia    Asthma    Bipolar 1 disorder (HCC)    COPD (chronic obstructive pulmonary disease) (HCC)    Diabetes mellitus without complication (HCC)    History of hepatitis C 2010   took interferon  x 1 year   Hypertension    Liver hemangioma    Lung nodule    Psoriasis    Psoriatic arthritis (HCC)    Rheumatoid arthritis (HCC)    Tardive dyskinesia    Wears dentures    full upper and lower    Past Surgical History:  Procedure Laterality Date   CARPAL TUNNEL RELEASE Left 02/21/2020   Procedure: CARPAL TUNNEL RELEASE ENDOSCOPIC;  Surgeon: Edie Norleen PARAS, MD;  Location: [facility] ORS;  Service: Orthopedics;  Laterality: Left;   CATARACT EXTRACTION W/ INTRAOCULAR LENS  IMPLANT, BILATERAL     CERVICAL SPINE SURGERY  2017   [facility]   CHOLECYSTECTOMY     COLONOSCOPY WITH PROPOFOL  N/A 09/14/2016   Procedure: COLONOSCOPY WITH PROPOFOL ;  Surgeon: Rogelia Copping, MD;  Location: [facility] ENDOSCOPY;  Service: Endoscopy;  Laterality: N/A;   ESOPHAGOGASTRODUODENOSCOPY (EGD) WITH PROPOFOL  N/A 09/14/2016   Procedure: ESOPHAGOGASTRODUODENOSCOPY (EGD) WITH PROPOFOL ;  Surgeon: Rogelia Copping, MD;  Location: [facility] ENDOSCOPY;  Service: Endoscopy;  Laterality: N/A;   FLEXIBLE BRONCHOSCOPY N/A 01/13/2016   Procedure: FLEXIBLE BRONCHOSCOPY;  Surgeon: Jorie Cha, MD;  Location: [facility] ORS;  Service: Cardiopulmonary;  Laterality: N/A;   JOINT REPLACEMENT     NEUROPLASTY MAJOR NERVE     POLYPECTOMY     THROAT SURGERY     TOTAL KNEE ARTHROPLASTY Right 06/15/2017   Procedure: TOTAL KNEE ARTHROPLASTY;  Surgeon: Cleotilde Barrio, MD;  Location: [facility] ORS;  Service: Orthopedics;  Laterality: Right;  reports that she quit smoking about 7 years ago. Her smoking use included e-cigarettes and cigarettes. She started smoking about 37 years ago. She has a 30 pack-year smoking history. She has never used smokeless tobacco. She reports that she does not currently use alcohol. She reports that she does not use drugs.  Allergies  Allergen Reactions   Celecoxib  Other (See Comments)   Haldol [Haloperidol] Other (See Comments)    Reaction: Pt felt like her neck was going to snap. Sever neck stiffness. Reaction: Pt  felt like her neck was going to snap.   Haloperidol Decanoate Other (See Comments)   Haloperidol Lactate Other (See Comments)    Sever neck stiffness.   Meloxicam  Other (See Comments)    GERD   Trileptal [Oxcarbazepine] Swelling   Codeine Nausea Only, Rash and Other (See Comments)   Diphenhydramine  Palpitations and Other (See Comments)    Family History  Problem Relation Age of Onset   Cancer Mother        Esophageal Ca   Asthma Brother    Breast cancer Neg Hx      Prior to Admission medications   Medication Sig Start Date End Date Taking? Authorizing Provider  acetaminophen  (TYLENOL ) 500 MG tablet Take 500 mg by mouth 2 (two) times daily.    [provider]  albuterol  (VENTOLIN  HFA) 108 (90 Base) MCG/ACT inhaler INHALE 2 PUFFS EVERY 4 HOURS AS NEEDED FOR WHEEZING FOR SHORTNESS OF BREATH 03/26/20   Tamea Dedra CROME, MD  ARIPiprazole  (ABILIFY ) 5 MG tablet Take 10 mg by mouth daily.    [provider]  buPROPion  (WELLBUTRIN  XL) 150 MG 24 hr tablet Take 150 mg by mouth daily.    [provider]  gabapentin  (NEURONTIN ) 300 MG capsule Take 300 mg by mouth 3 (three) times daily. Patient not taking: Reported on 07/28/2023    [provider]  lidocaine  (XYLOCAINE ) 2 % solution Use as directed 15 mLs in the mouth or throat as needed for mouth pain. 04/30/23   Brimage, Vondra, DO  oxybutynin  (DITROPAN ) 5 MG tablet Take 5 mg by mouth every morning.     [provider]  OZEMPIC, 1 MG/DOSE, 4 MG/3ML SOPN INJECT 1 MG UNDER THE SKIN EVERY 7 DAYS 03/24/22   [provider]  pantoprazole  (PROTONIX ) 40 MG tablet TAKE 1 TABLET (40 MG TOTAL) BY MOUTH EVERY EVENING. 02/21/20 04/30/23  Tamea Dedra CROME, MD  rosuvastatin  (CRESTOR ) 10 MG tablet Take 1 tablet (10 mg total) by mouth at bedtime. 10/03/17   Hinton Newell SQUIBB, MD  SYMBICORT 160-4.5 MCG/ACT inhaler Inhale into the lungs. 03/26/21   [provider]  SYNJARDY  XR 25-1000 MG TB24 Take 1 tablet  by mouth daily.  08/30/19   [provider]  traZODone  (DESYREL ) 100 MG tablet Take 200 mg by mouth at bedtime.    [provider]    Physical Exam: Vitals:   07/28/23 1331 07/28/23 1334 07/28/23 1336  BP:  (!) 154/94   Pulse:  71   Resp:  13   Temp:  97.9 F (36.6 C)   TempSrc:  Oral   SpO2: 95% 98%   Weight:   83.2 kg  Height:   5' 3 (1.6 m)    Constitutional: NAD, calm, comfortable Vitals:   07/28/23 1331 07/28/23 1334 07/28/23 1336  BP:  (!) 154/94   Pulse:  71   Resp:  13   Temp:  97.9 F (36.6 C)   TempSrc:  Oral  SpO2: 95% 98%   Weight:   83.2 kg  Height:   5' 3 (1.6 m)   Eyes: PERRL, lids and conjunctivae normal ENMT: Mucous membranes are moist. Posterior pharynx clear of any exudate or lesions.Normal dentition.  Neck: normal, supple, no masses, no thyromegaly Respiratory: clear to auscultation bilaterally, no wheezing, no crackles. Normal respiratory effort. No accessory muscle use.  Cardiovascular: Regular rate and rhythm, no murmurs / rubs / gallops. No extremity edema. 2+ pedal pulses. No carotid bruits.  Abdomen: no tenderness, no masses palpated. No hepatosplenomegaly. Bowel sounds positive.  Musculoskeletal: no clubbing / cyanosis. No joint deformity upper and lower extremities. Good ROM, no contractures. Normal muscle tone.  Skin: no rashes, lesions, ulcers. No induration Neurologic: CN 2-12 grossly intact. Sensation intact, DTR normal. Strength 5/5 in all 4.  Psychiatric: Normal judgment and insight. Alert and oriented x 3. Normal mood.     Labs on Admission: I have personally reviewed following labs and imaging studies  CBC: Recent Labs  Lab 07/28/23 1149  WBC 10.5  NEUTROABS 8.0*  HGB 11.4*  HCT 37.2  MCV 77.5*  PLT 324   Basic Metabolic Panel: Recent Labs  Lab 07/28/23 1149  NA 134*  K 4.1  CL 101  CO2 24  GLUCOSE 91  BUN 17  CREATININE 0.72  CALCIUM  9.0   GFR: Estimated Creatinine Clearance: 65.9 mL/min (by  C-G formula based on SCr of 0.72 mg/dL). Liver Function Tests: Recent Labs  Lab 07/28/23 1149  AST 18  ALT 17  ALKPHOS 44  BILITOT 0.8  PROT 7.5  ALBUMIN 3.9   No results for input(s): LIPASE, AMYLASE in the last 168 hours. No results for input(s): AMMONIA in the last 168 hours. Coagulation Profile: Recent Labs  Lab 07/28/23 1340  INR 1.1   Cardiac Enzymes: Recent Labs  Lab 07/28/23 1340  CKTOTAL 132   BNP (last 3 results) No results for input(s): PROBNP in the last 8760 hours. HbA1C: No results for input(s): HGBA1C in the last 72 hours. CBG: No results for input(s): GLUCAP in the last 168 hours. Lipid Profile: No results for input(s): CHOL, HDL, LDLCALC, TRIG, CHOLHDL, LDLDIRECT in the last 72 hours. Thyroid Function Tests: No results for input(s): TSH, T4TOTAL, FREET4, T3FREE, THYROIDAB in the last 72 hours. Anemia Panel: No results for input(s): VITAMINB12, FOLATE, FERRITIN, TIBC, IRON, RETICCTPCT in the last 72 hours. Urine analysis:    Component Value Date/Time   COLORURINE YELLOW 11/05/2020 0901   APPEARANCEUR HAZY (A) 11/05/2020 0901   APPEARANCEUR Clear 08/19/2015 1031   LABSPEC <1.005 (L) 11/05/2020 0901   PHURINE 5.5 11/05/2020 0901   GLUCOSEU 500 (A) 11/05/2020 0901   HGBUR LARGE (A) 11/05/2020 0901   BILIRUBINUR NEGATIVE 11/05/2020 0901   BILIRUBINUR Negative 08/19/2015 1031   KETONESUR NEGATIVE 11/05/2020 0901   PROTEINUR NEGATIVE 11/05/2020 0901   NITRITE NEGATIVE 11/05/2020 0901   LEUKOCYTESUR MODERATE (A) 11/05/2020 0901    Radiological Exams on Admission: DG Chest 2 View Result Date: 07/28/2023 CLINICAL DATA:  Chest pain and shortness of breath. EXAM: CHEST - 2 VIEW COMPARISON:  Chest radiographs 07/28/2022 FINDINGS: Cardiac silhouette and mediastinal contours are within normal limits. Mild-to-moderate atherosclerotic calcifications within the aortic arch. Unchanged mild inferolateral subpleural  linear scarring. No acute airspace opacity. No pleural effusion or pneumothorax. Moderate multilevel degenerative disc changes of the thoracic spine. Lower cervical spine ACDF hardware is again seen. IMPRESSION: No active cardiopulmonary disease. Electronically Signed   By: Tanda Orene HERO.D.  On: 07/28/2023 14:29    EKG: Independently reviewed.  Sinus rhythm, no acute ST changes.  Assessment/Plan Principal Problem:   NSTEMI (non-ST elevated myocardial infarction) (HCC)  (please populate well all problems here in Problem List. (For example, if patient is on BP meds at home and you resume or decide to hold them, it is a problem that needs to be her. Same for CAD, COPD, HLD and so on)  NSTEMI -Continue ACS medication including heparin  drip, aspirin , increase Crestor  dosage to 40 mg daily -Echocardiogram -Risk factor modification, check A1c and lipid panel.  Patient reported that she quit smoking 7 years ago -Discussed with on-call cardiology Dr. Mady, who agrees to be on consult for this patient. -Care everywhere showed patient had a normal stress test October/November 2024 with [facility], patient however does not recall such study done.  IIDM -Hold off metformin , as expect patient will receive IV contrast for ischemia study -SSI  Asthma/COPD -No acute concern, continue ICS and LABA, as needed albuterol   Anxiety/depression -On multiple SSRI, mentation at baseline.  DVT prophylaxis: Heparin  drip Code Status: Full code Family Communication: None at bedside Disposition Plan: Patient is sick with NSTEMI requiring patient cardiology workup, expect more than 2 midnight hospital Consults called: Cardiology Admission status: Telemetry admission   Cort ONEIDA Mana MD [facility] Pager [phone]  07/28/2023, 3:20 PM

## 2023-07-28 NOTE — ED Notes (Signed)
 Pt endorsing 3/10 HA at this time, medicated PRN. Pt ate dinner tray, lights lowered for comfort. VSS. Pending inpt bed.

## 2023-07-28 NOTE — Consult Note (Signed)
 Cardiology Consultation   Patient ID: Kathryn Ramirez MRN: [id number]; DOB: [birth year]  Admit date: 07/28/2023 Date of Consult: 07/28/2023  PCP:  [facility]   [facility] Providers Cardiologist: [facility]   Patient Profile:   Kathryn Ramirez is a 73 y.o. female with a hx of MV prolapse, coronary artery calcium  on CT, HLD, diabetes, COPD, bipolar 1, asthma, GERD, HTN, tardive dyskinesia who is being seen 07/28/2023 for the evaluation of chest pain at the request of Dr. Angelena.  History of Present Illness:   Kathryn Ramirez was recently referred to [facility] cardiology for abnormal EKG. She recently underwent echo and SPECT myocardial perfusion study 04/2013. Stress test showed normal myocardial perfusion. Echo showed normal LVEF with no significant valvular abnormalities. She has coronary artery calcium  in the pLAD on CT Chest.  Patient used to drink alcohol, she quit 20 years ago. She quit smoking a few years ago. No drug use. She denies exertional symptoms.  The patient presented to the ER 07/27/22 for chest pain. She initially went to [facility] urgent care for nausea, vomiting, chest pain and sob. She felt nauseated and vomited 3 times over an hour. After these events, she had substernal chest pain that radiated into her back and SOB. She denied fever, chills, diaphoresis. The took ASA 324 mg baby ASA prior to arrival. HS troponin was 85. EKG was nonischemic and she was sent to the ER for via EMS for further work-up.   In the ER BP 154/94, pulse 71bpm, RR 13, afebrile, 95%O2. HS trop 87. Resp panel negative. WBC 10.5, Hgb 11.4, K 4.1, Scr 0.72, BUN 17, normal LFTs. EKG showed NSR 78bpm and no ischemic changes.   Past Medical History:  Diagnosis Date   Acid reflux disease    Anemia    Asthma    Bipolar 1 disorder (HCC)    COPD (chronic obstructive pulmonary disease) (HCC)    Diabetes mellitus without complication (HCC)    History of hepatitis C 2010   took interferon x 1 year    Hypertension    Liver hemangioma    Lung nodule    Psoriasis    Psoriatic arthritis (HCC)    Rheumatoid arthritis (HCC)    Tardive dyskinesia    Wears dentures    full upper and lower    Past Surgical History:  Procedure Laterality Date   CARPAL TUNNEL RELEASE Left 02/21/2020   Procedure: CARPAL TUNNEL RELEASE ENDOSCOPIC;  Surgeon: Edie Norleen PARAS, MD;  Location: [facility] ORS;  Service: Orthopedics;  Laterality: Left;   CATARACT EXTRACTION W/ INTRAOCULAR LENS  IMPLANT, BILATERAL     CERVICAL SPINE SURGERY  2017   [facility]   CHOLECYSTECTOMY     COLONOSCOPY WITH PROPOFOL  N/A 09/14/2016   Procedure: COLONOSCOPY WITH PROPOFOL ;  Surgeon: Rogelia Copping, MD;  Location: [facility] ENDOSCOPY;  Service: Endoscopy;  Laterality: N/A;   ESOPHAGOGASTRODUODENOSCOPY (EGD) WITH PROPOFOL  N/A 09/14/2016   Procedure: ESOPHAGOGASTRODUODENOSCOPY (EGD) WITH PROPOFOL ;  Surgeon: Rogelia Copping, MD;  Location: [facility] ENDOSCOPY;  Service: Endoscopy;  Laterality: N/A;   FLEXIBLE BRONCHOSCOPY N/A 01/13/2016   Procedure: FLEXIBLE BRONCHOSCOPY;  Surgeon: Jorie Cha, MD;  Location: [facility] ORS;  Service: Cardiopulmonary;  Laterality: N/A;   JOINT REPLACEMENT     NEUROPLASTY MAJOR NERVE     POLYPECTOMY     THROAT SURGERY     TOTAL KNEE ARTHROPLASTY Right 06/15/2017   Procedure: TOTAL KNEE ARTHROPLASTY;  Surgeon: Cleotilde Barrio, MD;  Location: [facility] ORS;  Service: Orthopedics;  Laterality: Right;     Home Medications:  Prior to Admission medications   Medication Sig Start Date End Date Taking? Authorizing Provider  acetaminophen  (TYLENOL ) 500 MG tablet Take 500 mg by mouth 2 (two) times daily.    [provider]  albuterol  (VENTOLIN  HFA) 108 (90 Base) MCG/ACT inhaler INHALE 2 PUFFS EVERY 4 HOURS AS NEEDED FOR WHEEZING FOR SHORTNESS OF BREATH 03/26/20   Tamea Dedra CROME, MD  ARIPiprazole  (ABILIFY ) 5 MG tablet Take 10 mg by mouth daily.    [provider]  buPROPion  (WELLBUTRIN  XL) 150 MG 24 hr tablet Take  150 mg by mouth daily.    [provider]  gabapentin  (NEURONTIN ) 300 MG capsule Take 300 mg by mouth 3 (three) times daily. Patient not taking: Reported on 07/28/2023    [provider]  lidocaine  (XYLOCAINE ) 2 % solution Use as directed 15 mLs in the mouth or throat as needed for mouth pain. 04/30/23   Brimage, Vondra, DO  oxybutynin  (DITROPAN ) 5 MG tablet Take 5 mg by mouth every morning.     [provider]  OZEMPIC, 1 MG/DOSE, 4 MG/3ML SOPN INJECT 1 MG UNDER THE SKIN EVERY 7 DAYS 03/24/22   [provider]  pantoprazole  (PROTONIX ) 40 MG tablet TAKE 1 TABLET (40 MG TOTAL) BY MOUTH EVERY EVENING. 02/21/20 04/30/23  Tamea Dedra CROME, MD  rosuvastatin  (CRESTOR ) 10 MG tablet Take 1 tablet (10 mg total) by mouth at bedtime. 10/03/17   Hinton Newell SQUIBB, MD  SYMBICORT 160-4.5 MCG/ACT inhaler Inhale into the lungs. 03/26/21   [provider]  SYNJARDY  XR 25-1000 MG TB24 Take 1 tablet by mouth daily.  08/30/19   [provider]  traZODone  (DESYREL ) 100 MG tablet Take 200 mg by mouth at bedtime.    [provider]    Inpatient Medications: Scheduled Meds:  acetaminophen   500 mg Oral BID   ARIPiprazole   10 mg Oral Daily   [START ON 07/29/2023] aspirin  EC  81 mg Oral Daily   buPROPion   150 mg Oral Daily   Empagliflozin -metFORMIN  HCl ER  1 tablet Oral Daily   mometasone -formoterol   2 puff Inhalation BID   [START ON 07/29/2023] oxybutynin   5 mg Oral BH-q7a   pantoprazole   40 mg Oral QPM   rosuvastatin   10 mg Oral QHS   traZODone   200 mg Oral QHS   Continuous Infusions:  heparin  850 Units/hr (07/28/23 1454)   PRN Meds: acetaminophen , albuterol , nitroGLYCERIN , ondansetron  (ZOFRAN ) IV  Allergies:    Allergies  Allergen Reactions   Celecoxib  Other (See Comments)   Haldol [Haloperidol] Other (See Comments)    Reaction: Pt felt like her neck was going to snap. Sever neck stiffness. Reaction: Pt felt like her neck was going to snap.    Haloperidol Decanoate Other (See Comments)   Haloperidol Lactate Other (See Comments)    Sever neck stiffness.   Meloxicam  Other (See Comments)    GERD   Trileptal [Oxcarbazepine] Swelling   Codeine Nausea Only, Rash and Other (See Comments)   Diphenhydramine  Palpitations and Other (See Comments)    Social History:   Social History   Socioeconomic History   Marital status: Single    Spouse name: Not on file   Number of children: Not on file   Years of education: Not on file   Highest education level: Not on file  Occupational History   Not on file  Tobacco Use   Smoking status: Former    Current packs/day: 0.00  Average packs/day: 1 pack/day for 30.0 years (30.0 ttl pk-yrs)    Types: E-cigarettes, Cigarettes    Start date: 09/18/1985    Quit date: 09/19/2015    Years since quitting: 7.8   Smokeless tobacco: Never  Vaping Use   Vaping status: Never Used  Substance and Sexual Activity   Alcohol use: Not Currently    Alcohol/week: 0.0 standard drinks of alcohol    Comment: 03/31/2012 sobierty    Drug use: No   Sexual activity: Never  Other Topics Concern   Not on file  Social History Narrative   Not on file   Social Drivers of Health   Financial Resource Strain: Low Risk  (02/18/2022)   Received from [facility], [facility]   Overall Financial Resource Strain (CARDIA)    Difficulty of Paying Living Expenses: Not hard at all  Food Insecurity: No Food Insecurity (05/04/2023)   Received from [facility]   Hunger Vital Sign    Worried About Running Out of Food in the Last Year: Never true    Ran Out of Food in the Last Year: Never true  Transportation Needs: No Transportation Needs (05/04/2023)   Received from [facility] - Transportation    Lack of Transportation (Medical): No    Lack of Transportation (Non-Medical): No  Physical Activity: Insufficiently Active (02/18/2022)   Received from [facility], [facility]   Exercise Vital  Sign    Days of Exercise per Week: 2 days    Minutes of Exercise per Session: 20 min  Stress: No Stress Concern Present (02/18/2022)   Received from [facility], [facility] - Occupational Stress Questionnaire    Feeling of Stress : Not at all  Social Connections: Socially Isolated (02/18/2022)   Received from [facility], [facility]   Social Connection and Isolation Panel [NHANES]    Frequency of Communication with Friends and Family: More than three times a week    Frequency of Social Gatherings with Friends and Family: Twice a week    Attends Religious Services: Never    Database Administrator or Organizations: No    Attends Banker Meetings: Never    Marital Status: Divorced  Catering Manager Violence: Not At Risk (05/04/2023)   Received from [facility]   Humiliation, Afraid, Rape, and Kick questionnaire    Fear of Current or Ex-Partner: No    Emotionally Abused: No    Physically Abused: No    Sexually Abused: No    Family History:    Family History  Problem Relation Age of Onset   Cancer Mother        Esophageal Ca   Asthma Brother    Breast cancer Neg Hx      ROS:  Please see the history of present illness.   All other ROS reviewed and negative.     Physical Exam/Data:   Vitals:   07/28/23 1331 07/28/23 1334 07/28/23 1336  BP:  (!) 154/94   Pulse:  71   Resp:  13   Temp:  97.9 F (36.6 C)   TempSrc:  Oral   SpO2: 95% 98%   Weight:   83.2 kg  Height:   5' 3 (1.6 m)   No intake or output data in the 24 hours ending 07/28/23 1507    07/28/2023    1:36 PM 04/30/2023  2:17 PM 03/18/2023    8:07 AM  Last 3 Weights  Weight (lbs) 183 lb 6.4 oz 162 lb 162 lb  Weight (kg) 83.19 kg 73.483 kg 73.483 kg     Body mass index is 32.49 kg/m.  General:  Well nourished, well developed, in no acute distress HEENT: normal Neck: no JVD Vascular: No carotid bruits; Distal pulses 2+  bilaterally Cardiac:  normal S1, S2; RRR; no murmur  Lungs:  clear to auscultation bilaterally, no wheezing, rhonchi or rales  Abd: soft, nontender, no hepatomegaly  Ext: no edema Musculoskeletal:  No deformities, BUE and BLE strength normal and equal Skin: warm and dry  Neuro:  CNs 2-12 intact, no focal abnormalities noted Psych:  Normal affect   EKG:  The EKG was personally reviewed and demonstrates:  NSR 78bpm, no ST/t wave changes Telemetry:  Telemetry was personally reviewed and demonstrates:  NSR  Relevant CV Studies:   Echo 04/2023  Summary   1. The left ventricle is normal in size with normal wall thickness.    2. The left ventricular systolic function is normal, LVEF is visually  estimated at > 55%.    3. The right ventricle is normal in size, with normal systolic function.    4. There are no significant valvular abnormalities.   Myoview  lexiscan  04/2023  IMPRESSIONS:  __________________________________________________________________  -Normal myocardial perfusion study  - No evidence of any significant ischemia or scar  - Left ventricular systolic function is normal. Post stress the ejection fraction is > 65%. The left ventricle is normal in size.  -Unchanged 2.6 cm left breast mass with apparent internal fat attenuation, which has been stable over multiple prior mammograms and may represent a hamartoma. Atherosclerotic calcifications of the thoracic aorta and coronary arteries.   Stress test    Stress ECG: Non-diagnostic ECG portion of pharmacologic stress test.   Nuclear images are reported separately.  Resting ECG  - ECG is normal.  - Baseline shows normal sinus rhythm   Stress Findings  - A pharmacological stress test was performed using regadenoson  0.4mg  IV The patient with a peak HR of 95 bpm (63% of MPHR)  - The patient reported no symptoms during the stress test. The patient reported no symptoms during recovery.  - Blood pressure demonstrated a normal  response and heart rate demonstrated a normal response to stress. The patient's heart rate recovery was normal.   Stress ECG  - No significant ST segment changes were noted during stress or recovery  - There were no arrhythmias during stress.  - There were no arrhythmias during recovery. Non-diagnostic ECG portion of pharmacologic stress test.   Laboratory Data:  High Sensitivity Troponin:   Recent Labs  Lab 07/28/23 1149 07/28/23 1340  TROPONINIHS 85* 87*     Chemistry Recent Labs  Lab 07/28/23 1149  NA 134*  K 4.1  CL 101  CO2 24  GLUCOSE 91  BUN 17  CREATININE 0.72  CALCIUM  9.0  GFRNONAA >60  ANIONGAP 9    Recent Labs  Lab 07/28/23 1149  PROT 7.5  ALBUMIN 3.9  AST 18  ALT 17  ALKPHOS 44  BILITOT 0.8   Lipids No results for input(s): CHOL, TRIG, HDL, LABVLDL, LDLCALC, CHOLHDL in the last 168 hours.  Hematology Recent Labs  Lab 07/28/23 1149  WBC 10.5  RBC 4.80  HGB 11.4*  HCT 37.2  MCV 77.5*  MCH 23.8*  MCHC 30.6  RDW 15.4  PLT 324   Thyroid No results  for input(s): TSH, FREET4 in the last 168 hours.  BNPNo results for input(s): BNP, PROBNP in the last 168 hours.  DDimer No results for input(s): DDIMER in the last 168 hours.   Radiology/Studies:  DG Chest 2 View Result Date: 07/28/2023 CLINICAL DATA:  Chest pain and shortness of breath. EXAM: CHEST - 2 VIEW COMPARISON:  Chest radiographs 07/28/2022 FINDINGS: Cardiac silhouette and mediastinal contours are within normal limits. Mild-to-moderate atherosclerotic calcifications within the aortic arch. Unchanged mild inferolateral subpleural linear scarring. No acute airspace opacity. No pleural effusion or pneumothorax. Moderate multilevel degenerative disc changes of the thoracic spine. Lower cervical spine ACDF hardware is again seen. IMPRESSION: No active cardiopulmonary disease. Electronically Signed   By: Tanda Lyons M.D.   On: 07/28/2023 14:29     Assessment and Plan:    NSTEMI - patient presented to urgent care with nasuea, vomiting, chest pain and SOB. Hs trop 57 and she was sent to the ER. - in the ER HS trop trend flat (85>87)and EKG non-ischemic started on IV heparin  - Chest CT showed  mild coronary calcifications - with RF for CAD - check an echo - she had recent stress test and echo at [facility] which were unremarkable - no active chest pain, has some nausea - continue ASA 81mg  daily and Crestor  40mg  daily - plan for heart cath tomorrow Risks and benefits of cardiac catheterization have been discussed with the patient.  These include bleeding, infection, kidney damage, stroke, heart attack, death.  The patient understands these risks and is willing to proceed.   HLD - check lipids - continue Crestor  40mg  daily  DM2 - SSI per IM  For questions or updates, please contact [facility] Please consult [email or web address] for contact info under    Signed, Remer Couse VEAR Fishman, PA-C  07/28/2023 3:07 PM

## 2023-07-28 NOTE — ED Notes (Signed)
 Pt walks w/ steady gait to BR.

## 2023-07-28 NOTE — ED Notes (Addendum)
 Pt assisted to toilet in room. RN recommended pulling the bed close to the toilet and assist to stand from there. Pt safety back in bed and the bed is locked in the lowest position. Family at bedside.

## 2023-07-28 NOTE — Discharge Instructions (Addendum)
 Please go to the emergency department [facility] via EMS for further evaluation of your chest pain and shortness of breath.

## 2023-07-28 NOTE — ED Triage Notes (Signed)
 Patient to ED via [facility] from [facility]. Patient woke up this morning with chest pain, L arm pain, and nausea. Patient took aspirin  and zofran  at home with no relief of symptoms. Patient went to [facility] where they told her that her troponin 85 and she would need to be seen in the ER. Patient is A&Ox4 and in NAD at this time. Even and unlabored respirations.

## 2023-07-28 NOTE — ED Provider Notes (Signed)
 [facility] Provider Note    Event Date/Time   First MD Initiated Contact with Patient 07/28/23 1341     (approximate)   History   Chest Pain   HPI Kathryn Ramirez is a 73 y.o. female with past medical history of COPD, bipolar disorder, hypertension, rheumatoid arthritis, here with chest pain.  The patient states that earlier today, she was in her usual state of health.  She states that she became acutely nauseous, then started vomiting multiple times.  It was nonbloody and nonbilious.  She felt short of breath.  She felt a dull, aching, substernal chest pressure.  She went to urgent care and had labs sent, which showed an elevated troponin.  She was subsequently sent to the ED.  She took 4 aspirin  prior to going to urgent care.  The pain is now resolved.  Denies history of similar symptoms.  Denies known coronary disease.      Physical Exam   Triage Vital Signs: ED Triage Vitals  Encounter Vitals Group     BP 07/28/23 1334 (!) 154/94     Systolic BP Percentile --      Diastolic BP Percentile --      Pulse Rate 07/28/23 1334 71     Resp 07/28/23 1334 13     Temp 07/28/23 1334 97.9 F (36.6 C)     Temp Source 07/28/23 1334 Oral     SpO2 07/28/23 1331 95 %     Weight 07/28/23 1336 183 lb 6.4 oz (83.2 kg)     Height 07/28/23 1336 5' 3 (1.6 m)     Head Circumference --      Peak Flow --      Pain Score 07/28/23 1336 0     Pain Loc --      Pain Education --      Exclude from Growth Chart --     Most recent vital signs: Vitals:   07/28/23 1331 07/28/23 1334  BP:  (!) 154/94  Pulse:  71  Resp:  13  Temp:  97.9 F (36.6 C)  SpO2: 95% 98%     General: Awake, no distress.  CV:  Good peripheral perfusion.  Regular rate and rhythm.  No murmurs or rubs. Resp:  Normal work of breathing.  Lungs clear to auscultation bilaterally Abd:  No distention.  Other:  No lower extremity edema   ED Results / Procedures / Treatments   Labs (all labs  ordered are listed, but only abnormal results are displayed) Labs Reviewed  SARS CORONAVIRUS 2 BY RT PCR  PROTIME-INR  APTT  HEPARIN  LEVEL (UNFRACTIONATED)  TROPONIN I (HIGH SENSITIVITY)     EKG Normal sinus rhythm, ventricular rate 73.  PR 180, QRS 95, QTc 443.  No acute ST elevations or depressions.  Acute events of acute ischemia or infarct.   RADIOLOGY    I also independently reviewed and agree with radiologist interpretations.   PROCEDURES:  Critical Care performed: Yes, see critical care procedure note(s)  .Critical Care  Performed by: Angelena Smalls, MD Authorized by: Angelena Smalls, MD   Critical care provider statement:    Critical care time (minutes):  30   Critical care time was exclusive of:  Separately billable procedures and treating other patients   Critical care was necessary to treat or prevent imminent or life-threatening deterioration of the following conditions:  Circulatory failure, cardiac failure and respiratory failure   Critical care was time spent personally by me on  the following activities:  Development of treatment plan with patient or surrogate, discussions with consultants, evaluation of patient's response to treatment, examination of patient, ordering and review of laboratory studies, ordering and review of radiographic studies, ordering and performing treatments and interventions, pulse oximetry, re-evaluation of patient's condition and review of old charts     MEDICATIONS ORDERED IN ED: Medications  heparin  bolus via infusion 4,000 Units (has no administration in time range)  heparin  ADULT infusion 100 units/mL (25000 units/250mL) (has no administration in time range)     IMPRESSION / MDM / ASSESSMENT AND PLAN / ED COURSE  I reviewed the triage vital signs and the nursing notes.                              Differential diagnosis includes, but is not limited to, ACS, demand ischemia, anemia, arrhythmia, renal failure,  PE  Patient's presentation is most consistent with acute presentation with potential threat to life or bodily function.  The patient is on the cardiac monitor to evaluate for evidence of arrhythmia and/or significant heart rate changes   73 year old female with past medical history as above here with significant nausea, vomiting, and transient chest pain, now resolved.  EKG is nonischemic.  Troponin reviewed from urgent care and is in the 80s.  Symptoms are highly concerning for angina with associated shortness of breath and resolution currently.  Will place on heparin , admit to medicine.  Patient has no symptoms to suggest PE.  Pain is not concerning for dissection.  Lab work is otherwise reassuring.  No major anemia.  No significant renal failure.   FINAL CLINICAL IMPRESSION(S) / ED DIAGNOSES   Final diagnoses:  NSTEMI (non-ST elevated myocardial infarction) (HCC)     Rx / DC Orders   ED Discharge Orders     None        Note:  This document was prepared using Dragon voice recognition software and may include unintentional dictation errors.   Angelena Smalls, MD 07/28/23 [phone]

## 2023-07-28 NOTE — ED Triage Notes (Signed)
 Pt c/o SOB, chest pain with headache, and vomiting that started today, pt states she took 4, 81 mg Asprin about 30 minuets ago. Pt states she is still feeling SOB with her hands numb but the chest pain has resolved.   Pt denies any dizziness, blurry vision or chest pain at this time.

## 2023-07-28 NOTE — ED Notes (Signed)
 Patient is being discharged from the Urgent Care and sent to the Emergency Department via EMS . Per Harriette, NP, patient is in need of higher level of care due to Elevated troponin levels. Patient is aware and verbalizes understanding of plan of care.  Vitals:   07/28/23 1133 07/28/23 1140  BP: (!) 183/96 (!) 162/75  Pulse: 89   Resp: 18   Temp: 98.2 F (36.8 C)   SpO2: 98%

## 2023-07-29 ENCOUNTER — Encounter
Admission: EM | Disposition: A | Discharge status: Home / Self Care | Payer: Self-pay | Source: Home / Self Care | Attending: Internal Medicine

## 2023-07-29 DIAGNOSIS — U071 COVID-19: Secondary | ICD-10-CM | POA: Diagnosis not present

## 2023-07-29 DIAGNOSIS — I214 Non-ST elevation (NSTEMI) myocardial infarction: Secondary | ICD-10-CM | POA: Diagnosis not present

## 2023-07-29 HISTORY — PX: LEFT HEART CATH AND CORONARY ANGIOGRAPHY: CATH118249

## 2023-07-29 LAB — LIPID PANEL
Cholesterol: 111 mg/dL (ref 0–200)
HDL: 51 mg/dL (ref >40)
LDL Cholesterol: 37 mg/dL (ref 0–99)
Total CHOL/HDL Ratio: 2.2 {ratio}
Triglycerides: 115 mg/dL (ref <150)
VLDL: 23 mg/dL (ref 0–40)

## 2023-07-29 LAB — CBC
HCT: 35.8 % — ABNORMAL LOW (ref 36.0–46.0)
Hemoglobin: 10.7 g/dL — ABNORMAL LOW (ref 12.0–15.0)
MCH: 24 pg — ABNORMAL LOW (ref 26.0–34.0)
MCHC: 29.9 g/dL — ABNORMAL LOW (ref 30.0–36.0)
MCV: 80.4 fL (ref 80.0–100.0)
Platelets: 286 10*3/uL (ref 150–400)
RBC: 4.45 MIL/uL (ref 3.87–5.11)
RDW: 15.4 % (ref 11.5–15.5)
WBC: 7.1 10*3/uL (ref 4.0–10.5)
nRBC: 0 % (ref 0.0–0.2)

## 2023-07-29 LAB — HEPARIN LEVEL (UNFRACTIONATED)
Heparin Unfractionated: 0.17 [IU]/mL — ABNORMAL LOW (ref 0.30–0.70)
Heparin Unfractionated: >1.1 [IU]/mL — ABNORMAL HIGH (ref 0.30–0.70)

## 2023-07-29 LAB — GLUCOSE, CAPILLARY
Glucose-Capillary: 48 mg/dL — ABNORMAL LOW (ref 70–99)
Glucose-Capillary: 62 mg/dL — ABNORMAL LOW (ref 70–99)
Glucose-Capillary: 112 mg/dL — ABNORMAL HIGH (ref 70–99)
Glucose-Capillary: 76 mg/dL (ref 70–99)
Glucose-Capillary: 113 mg/dL — ABNORMAL HIGH (ref 70–99)

## 2023-07-29 LAB — CBG MONITORING, ED
Glucose-Capillary: 88 mg/dL (ref 70–99)
Glucose-Capillary: 91 mg/dL (ref 70–99)

## 2023-07-29 LAB — HEMOGLOBIN A1C
Hgb A1c MFr Bld: 6.1 % — ABNORMAL HIGH (ref 4.8–5.6)
Mean Plasma Glucose: 128 mg/dL

## 2023-07-29 SURGERY — LEFT HEART CATH AND CORONARY ANGIOGRAPHY
Anesthesia: Moderate Sedation

## 2023-07-29 MED ORDER — HEPARIN (PORCINE) IN NACL 2000-0.9 UNIT/L-% IV SOLN
INTRAVENOUS | Status: DC | PRN
  Administered 2023-07-29: 1000 mL

## 2023-07-29 MED ORDER — DEXTROSE 50 % IV SOLN
INTRAVENOUS | Status: AC
  Filled 2023-07-29: qty 50

## 2023-07-29 MED ORDER — FENTANYL CITRATE (PF) 100 MCG/2ML IJ SOLN
INTRAMUSCULAR | Status: AC
  Filled 2023-07-29: qty 2

## 2023-07-29 MED ORDER — IOHEXOL 300 MG/ML  SOLN
INTRAMUSCULAR | Status: DC | PRN
  Administered 2023-07-29: 30 mL

## 2023-07-29 MED ORDER — LABETALOL HCL 5 MG/ML IV SOLN
10.0000 mg | INTRAVENOUS | Status: AC | PRN
Start: 2023-07-29 — End: 2023-07-29

## 2023-07-29 MED ORDER — HYDRALAZINE HCL 20 MG/ML IJ SOLN: 10.0000 mg | INTRAMUSCULAR | Status: AC | PRN

## 2023-07-29 MED ORDER — SODIUM CHLORIDE 0.9 % WEIGHT BASED INFUSION
3.0000 mL/kg/h | INTRAVENOUS | Status: DC
  Administered 2023-07-29: 3 mL/kg/h via INTRAVENOUS

## 2023-07-29 MED ORDER — MIDAZOLAM HCL 2 MG/2ML IJ SOLN
INTRAMUSCULAR | Status: DC | PRN
  Administered 2023-07-29: 1 mg via INTRAVENOUS

## 2023-07-29 MED ORDER — DEXTROSE 50 % IV SOLN
25.0000 mL | Freq: Once | INTRAVENOUS | Status: AC
  Administered 2023-07-29: 25 mL via INTRAVENOUS

## 2023-07-29 MED ORDER — HEPARIN (PORCINE) 25000 UT/250ML-% IV SOLN
950.0000 [IU]/h | INTRAVENOUS | Status: DC
  Administered 2023-07-29: 950 [IU]/h via INTRAVENOUS
  Filled 2023-07-29: qty 250

## 2023-07-29 MED ORDER — ENOXAPARIN SODIUM 40 MG/0.4ML IJ SOSY
40.0000 mg | PREFILLED_SYRINGE | INTRAMUSCULAR | Status: DC
  Administered 2023-07-30: 40 mg via SUBCUTANEOUS
  Filled 2023-07-29 (×2): qty 0.4

## 2023-07-29 MED ORDER — SODIUM CHLORIDE 0.9% FLUSH
3.0000 mL | Freq: Two times a day (BID) | INTRAVENOUS | Status: DC
  Administered 2023-07-29 – 2023-07-30 (×2): 3 mL via INTRAVENOUS

## 2023-07-29 MED ORDER — SODIUM CHLORIDE 0.9 % WEIGHT BASED INFUSION: 1.0000 mL/kg/h | INTRAVENOUS | Status: DC

## 2023-07-29 MED ORDER — LIDOCAINE HCL 1 % IJ SOLN
INTRAMUSCULAR | Status: AC
  Filled 2023-07-29: qty 20

## 2023-07-29 MED ORDER — VERAPAMIL HCL 2.5 MG/ML IV SOLN
INTRAVENOUS | Status: AC
  Filled 2023-07-29: qty 2

## 2023-07-29 MED ORDER — HEPARIN (PORCINE) IN NACL 1000-0.9 UT/500ML-% IV SOLN
INTRAVENOUS | Status: AC
  Filled 2023-07-29: qty 1000

## 2023-07-29 MED ORDER — SODIUM CHLORIDE 0.9 % IV SOLN: 250.0000 mL | INTRAVENOUS | Status: DC | PRN

## 2023-07-29 MED ORDER — HEPARIN SODIUM (PORCINE) 1000 UNIT/ML IJ SOLN
INTRAMUSCULAR | Status: DC | PRN
  Administered 2023-07-29: 4000 [IU] via INTRAVENOUS

## 2023-07-29 MED ORDER — LIDOCAINE HCL (PF) 1 % IJ SOLN
INTRAMUSCULAR | Status: DC | PRN
  Administered 2023-07-29: 2 mL

## 2023-07-29 MED ORDER — VERAPAMIL HCL 2.5 MG/ML IV SOLN
INTRAVENOUS | Status: DC | PRN
  Administered 2023-07-29: 2.5 mg via INTRAVENOUS

## 2023-07-29 MED ORDER — MIDAZOLAM HCL 2 MG/2ML IJ SOLN
INTRAMUSCULAR | Status: AC
  Filled 2023-07-29: qty 2

## 2023-07-29 MED ORDER — SODIUM CHLORIDE 0.9% FLUSH: 3.0000 mL | INTRAVENOUS | Status: DC | PRN

## 2023-07-29 MED ORDER — HEPARIN BOLUS VIA INFUSION
2100.0000 [IU] | Freq: Once | INTRAVENOUS | Status: AC
  Administered 2023-07-29: 2100 [IU] via INTRAVENOUS
  Filled 2023-07-29: qty 2100

## 2023-07-29 MED ORDER — FENTANYL CITRATE (PF) 100 MCG/2ML IJ SOLN
INTRAMUSCULAR | Status: DC | PRN
  Administered 2023-07-29: 25 ug via INTRAVENOUS

## 2023-07-29 MED ORDER — HEPARIN SODIUM (PORCINE) 1000 UNIT/ML IJ SOLN
INTRAMUSCULAR | Status: AC
  Filled 2023-07-29: qty 10

## 2023-07-29 SURGICAL SUPPLY — 10 items
CATH 5FR JL3.5 JR4 ANG PIG MP (CATHETERS) IMPLANT
DEVICE RAD TR BAND REGULAR (VASCULAR PRODUCTS) IMPLANT
DRAPE BRACHIAL (DRAPES) IMPLANT
GLIDESHEATH SLEND SS 6F .021 (SHEATH) IMPLANT
GUIDEWIRE INQWIRE 1.5J.035X260 (WIRE) IMPLANT
INQWIRE 1.5J .035X260CM (WIRE) ×1
PACK CARDIAC CATH (CUSTOM PROCEDURE TRAY) ×1 IMPLANT
PROTECTION STATION PRESSURIZED (MISCELLANEOUS) ×1
SET ATX-X65L (MISCELLANEOUS) IMPLANT
STATION PROTECTION PRESSURIZED (MISCELLANEOUS) IMPLANT

## 2023-07-29 NOTE — H&P (View-Only) (Signed)
 Rounding Note    Patient Name: Kathryn Ramirez Date of Encounter: 07/29/2023  [facility] Cardiologist: New  Subjective   Patient found to be COVID positive. She denies chest pain. She is still wanting to pursue heart cath today. No further N/V.  Inpatient Medications    Scheduled Meds:  ARIPiprazole   10 mg Oral Daily   aspirin  EC  81 mg Oral Daily   buPROPion   150 mg Oral Daily   insulin  aspart  0-15 Units Subcutaneous TID WC   mometasone -formoterol   2 puff Inhalation BID   oxybutynin   5 mg Oral Daily   pantoprazole   40 mg Oral QPM   rosuvastatin   40 mg Oral QHS   traZODone   200 mg Oral QHS   Continuous Infusions:  heparin  1,100 Units/hr (07/29/23 0042)   PRN Meds: acetaminophen , albuterol , nitroGLYCERIN , ondansetron  (ZOFRAN ) IV   Vital Signs    Vitals:   07/29/23 0330 07/29/23 0400 07/29/23 0700 07/29/23 0746  BP: 94/69 92/66  105/73  Pulse: 66 70  75  Resp:  18  15  Temp:  98 F (36.7 C)  98.1 F (36.7 C)  TempSrc:  Oral  Oral  SpO2: 96% 91% 97% 96%  Weight:      Height:       No intake or output data in the 24 hours ending 07/29/23 0816    07/28/2023    1:36 PM 04/30/2023    2:17 PM 03/18/2023    8:07 AM  Last 3 Weights  Weight (lbs) 183 lb 6.4 oz 162 lb 162 lb  Weight (kg) 83.19 kg 73.483 kg 73.483 kg      Telemetry    NSR PVCs - Personally Reviewed  ECG    No new - Personally Reviewed  Physical Exam   GEN: No acute distress.   Neck: No JVD Cardiac: RRR, no murmurs, rubs, or gallops.  Respiratory: Clear to auscultation bilaterally. GI: Soft, nontender, non-distended  MS: No edema; No deformity. Neuro:  Nonfocal  Psych: Normal affect   Labs    High Sensitivity Troponin:   Recent Labs  Lab 07/28/23 1149 07/28/23 1340 07/28/23 1802  TROPONINIHS 85* 87* 101*     Chemistry Recent Labs  Lab 07/28/23 1149  NA 134*  K 4.1  CL 101  CO2 24  GLUCOSE 91  BUN 17  CREATININE 0.72  CALCIUM  9.0  PROT 7.5  ALBUMIN 3.9   AST 18  ALT 17  ALKPHOS 44  BILITOT 0.8  GFRNONAA >60  ANIONGAP 9    Lipids  Recent Labs  Lab 07/29/23 0439  CHOL 111  TRIG 115  HDL 51  LDLCALC 37  CHOLHDL 2.2    Hematology Recent Labs  Lab 07/28/23 1149 07/29/23 0439  WBC 10.5 7.1  RBC 4.80 4.45  HGB 11.4* 10.7*  HCT 37.2 35.8*  MCV 77.5* 80.4  MCH 23.8* 24.0*  MCHC 30.6 29.9*  RDW 15.4 15.4  PLT 324 286   Thyroid  Recent Labs  Lab 07/28/23 1530  TSH 0.913    BNPNo results for input(s): BNP, PROBNP in the last 168 hours.  DDimer No results for input(s): DDIMER in the last 168 hours.   Radiology    DG Chest 2 View Result Date: 07/28/2023 CLINICAL DATA:  Chest pain and shortness of breath. EXAM: CHEST - 2 VIEW COMPARISON:  Chest radiographs 07/28/2022 FINDINGS: Cardiac silhouette and mediastinal contours are within normal limits. Mild-to-moderate atherosclerotic calcifications within the aortic arch. Unchanged mild inferolateral subpleural  linear scarring. No acute airspace opacity. No pleural effusion or pneumothorax. Moderate multilevel degenerative disc changes of the thoracic spine. Lower cervical spine ACDF hardware is again seen. IMPRESSION: No active cardiopulmonary disease. Electronically Signed   By: Tanda Lyons M.D.   On: 07/28/2023 14:29    Cardiac Studies   Echo 04/2023  Summary   1. The left ventricle is normal in size with normal wall thickness.    2. The left ventricular systolic function is normal, LVEF is visually  estimated at > 55%.    3. The right ventricle is normal in size, with normal systolic function.    4. There are no significant valvular abnormalities.    Myoview  lexiscan  04/2023  IMPRESSIONS:  __________________________________________________________________  -Normal myocardial perfusion study  - No evidence of any significant ischemia or scar  - Left ventricular systolic function is normal. Post stress the ejection fraction is > 65%. The left ventricle is normal  in size.  -Unchanged 2.6 cm left breast mass with apparent internal fat attenuation, which has been stable over multiple prior mammograms and may represent a hamartoma. Atherosclerotic calcifications of the thoracic aorta and coronary arteries.    Stress test    Stress ECG: Non-diagnostic ECG portion of pharmacologic stress test.   Nuclear images are reported separately.  Resting ECG  - ECG is normal.  - Baseline shows normal sinus rhythm   Stress Findings  - A pharmacological stress test was performed using regadenoson  0.4mg  IV The patient with a peak HR of 95 bpm (63% of MPHR)  - The patient reported no symptoms during the stress test. The patient reported no symptoms during recovery.  - Blood pressure demonstrated a normal response and heart rate demonstrated a normal response to stress. The patient's heart rate recovery was normal.   Stress ECG  - No significant ST segment changes were noted during stress or recovery  - There were no arrhythmias during stress.  - There were no arrhythmias during recovery. Non-diagnostic ECG portion of pharmacologic stress test.   Patient Profile     73 y.o. female with a hx of MV prolapse, coronary artery calcium  on CT, HLD, diabetes, COPD, bipolar 1, asthma, GERD, HTN, tardive dyskinesia who is being seen 07/28/2023 for the evaluation of chest pain   Assessment & Plan    NSTEMI - patient presented to urgent care with nasuea, vomiting, chest pain and SOB. Hs trop 57 and she was sent to the ER. - in the ER HS trop trend flat (85>87>101)and EKG non-ischemic started on IV heparin  and found to be COVID+ - Chest CT showed  mild coronary calcifications - with RF for CAD -  echo ordered - she had recent stress test and echo at [facility] which were unremarkable - no active chest pain, no further N/V - continue ASA 81mg  daily and Crestor  40mg  daily - plan for heart cath today Risks and benefits of cardiac catheterization have been discussed with the patient.   These include bleeding, infection, kidney damage, stroke, heart attack, death.  The patient understands these risks and is willing to proceed.   HLD - LDL 37 - continue Crestor  40mg  daily   DM2 - SSI per IM  For questions or updates, please contact [facility] Please consult [email or web address] for contact info under        Signed, Lawrencia Mauney VEAR Fishman, PA-C  07/29/2023, 8:16 AM

## 2023-07-29 NOTE — ED Notes (Signed)
 Pt ambulates to BR w/ steady gait, EKG completed. Given denture adhesive per request. Maintaining NPO status. Pending inpt bed.

## 2023-07-29 NOTE — Progress Notes (Signed)
 PROGRESS NOTE    Kathryn Ramirez   FMW:[id number] DOB: 09-16-1950  DOA: 07/28/2023 Date of Service: 07/29/23 which is hospital day 1  PCP: Care, [facility] course / significant events:   HPI: Kathryn Ramirez is a 73 y.o. female with medical history significant of asthma/COPD, HTN, IIDM, RA, tardive dyskinesia, chronic hepatitis C status post interferon treatment, GERD, presented 07/28/23 with new onset of chest pain since that morning, assoc w/ N/V x1. Sent to ED from urgent care.   01/02: admitted for NSTEMI, started on heparin  gtt, cardiology consulted  01/03: cardiac cath today, per report normal coronary arteries      Consultants:  Cardiology   Procedures/Surgeries: 07/29/23 [facility]  Conclusions: No angiographically significant coronary artery disease. Grossly normal left ventricular systolic function, though contrast opacification of the left ventricle was suboptimal (LVEF 55-65%). Normal left ventricular filling pressure (LVEDP 9 mmHg). Recommendations: Continue conservative therapy of COVID-19 infection.  I suspect mild troponin elevation reflects supply-demand mismatch. Obtain echocardiogram to confirm normal LVEF in the setting of troponin elevation and COVID-19 infection. Primary prevention of coronary artery disease. Anticipate discharge home tomorrow (patient does not have anyone to stay with her tonight, precluding discharge this evening).     ASSESSMENT & PLAN:   NSTEMI Per cath report, elevated troponin with normal coronary arteries  Care everywhere showed patient had a normal stress test October/November 2024 with [facility], patient however does not recall such study done. Continue ACS medication including heparin  drip, aspirin , increase Crestor  dosage to 40 mg daily Echocardiogram pending  Risk factor modification, check A1c and lipid panel.  Patient reported that she quit smoking 7 years ago Appreciate Cardiology recs: continue tx COVID, echo  pending, primary prevention  Anticipate discharge home tomorrow AM  (+)COVID Likely cause of supply/demand mismatch cause elevation troponin  No hypoxia  Supportive care    IIDM Hold off metformin , as will receive IV contrast for ischemia study SSI   Asthma/COPD No acute exacerbation or hypoxia continue ICS and LABA, as needed albuterol    Anxiety/depression On multiple SSRI, mentation at baseline.    Class 1 obesity based on BMI: Body mass index is 32.49 kg/m.  Underweight - under 18  overweight - 25 to 29 obese - 30 or more Class 1 obesity: BMI of 30.0 to 34 Class 2 obesity: BMI of 35.0 to 39 Class 3 obesity: BMI of 40.0 to 49 Super Morbid Obesity: BMI 50-59 Super-super Morbid Obesity: BMI 60+ Significantly low or high BMI is associated with higher medical risk.  Weight management advised as adjunct to other disease management and risk reduction treatments    DVT prophylaxis: lovenox  IV fluids: no continuous IV fluids  Nutrition: plan for cardiac diet whe nout of recovery from cath  Central lines / invasive devices: none  Code Status: FULL CODE ACP documentation reviewed:  none on file in [facility]  TOC needs: TBD Barriers to dispo / significant pending items: cardiac clearance prior to dc - anticipate home tomorrow              Subjective / Brief ROS:  Patient reports no concerns following cardiac cath Denies CP/SOB.  Pain controlled.  Denies new weakness.  Tolerating diet.  Reports no concerns w/ urination/defecation.   Family Communication: none at this time     Objective Findings:  Vitals:   07/29/23 1630 07/29/23 1647 07/29/23 1700 07/29/23 1715  BP: 130/78 137/84 131/79 123/86  Pulse: 74 78 72 67  Resp: 17  19 15 15   Temp:      TempSrc:      SpO2: 91% 91% 92% 92%  Weight:      Height:       No intake or output data in the 24 hours ending 07/29/23 1810 Filed Weights   07/28/23 1336  Weight: 83.2 kg    Examination:  Physical  Exam Constitutional:      General: She is not in acute distress. Pulmonary:     Effort: Pulmonary effort is normal.  Neurological:     Mental Status: She is alert.          Scheduled Medications:   ARIPiprazole   10 mg Oral Daily   aspirin  EC  81 mg Oral Daily   buPROPion   150 mg Oral Daily   [START ON 07/30/2023] enoxaparin  (LOVENOX ) injection  40 mg Subcutaneous Q24H   mometasone -formoterol   2 puff Inhalation BID   oxybutynin   5 mg Oral Daily   pantoprazole   40 mg Oral QPM   rosuvastatin   40 mg Oral QHS   sodium chloride  flush  3 mL Intravenous Q12H   traZODone   200 mg Oral QHS    Continuous Infusions:  sodium chloride       PRN Medications:  sodium chloride , acetaminophen , albuterol , hydrALAZINE , labetalol , nitroGLYCERIN , ondansetron  (ZOFRAN ) IV, sodium chloride  flush  Antimicrobials from admission:  Anti-infectives (From admission, onward)    None           Data Reviewed:  I have personally reviewed the following...  CBC: Recent Labs  Lab 07/28/23 1149 07/29/23 0439  WBC 10.5 7.1  NEUTROABS 8.0*  --   HGB 11.4* 10.7*  HCT 37.2 35.8*  MCV 77.5* 80.4  PLT 324 286   Basic Metabolic Panel: Recent Labs  Lab 07/28/23 1149  NA 134*  K 4.1  CL 101  CO2 24  GLUCOSE 91  BUN 17  CREATININE 0.72  CALCIUM  9.0   GFR: Estimated Creatinine Clearance: 65.9 mL/min (by C-G formula based on SCr of 0.72 mg/dL). Liver Function Tests: Recent Labs  Lab 07/28/23 1149  AST 18  ALT 17  ALKPHOS 44  BILITOT 0.8  PROT 7.5  ALBUMIN 3.9   No results for input(s): LIPASE, AMYLASE in the last 168 hours. No results for input(s): AMMONIA in the last 168 hours. Coagulation Profile: Recent Labs  Lab 07/28/23 1340  INR 1.1   Cardiac Enzymes: Recent Labs  Lab 07/28/23 1340  CKTOTAL 132   BNP (last 3 results) No results for input(s): PROBNP in the last 8760 hours. HbA1C: No results for input(s): HGBA1C in the last 72 hours. CBG: Recent Labs   Lab 07/29/23 1211 07/29/23 1343 07/29/23 1345 07/29/23 1433 07/29/23 1633  GLUCAP 91 48* 62* 112* 76   Lipid Profile: Recent Labs    07/29/23 0439  CHOL 111  HDL 51  LDLCALC 37  TRIG 115  CHOLHDL 2.2   Thyroid Function Tests: Recent Labs    07/28/23 1530  TSH 0.913   Anemia Panel: No results for input(s): VITAMINB12, FOLATE, FERRITIN, TIBC, IRON, RETICCTPCT in the last 72 hours. Most Recent Urinalysis On File:     Component Value Date/Time   COLORURINE YELLOW 11/05/2020 0901   APPEARANCEUR HAZY (A) 11/05/2020 0901   APPEARANCEUR Clear 08/19/2015 1031   LABSPEC <1.005 (L) 11/05/2020 0901   PHURINE 5.5 11/05/2020 0901   GLUCOSEU 500 (A) 11/05/2020 0901   HGBUR LARGE (A) 11/05/2020 0901   BILIRUBINUR NEGATIVE 11/05/2020 0901   BILIRUBINUR Negative 08/19/2015  1031   KETONESUR NEGATIVE 11/05/2020 0901   PROTEINUR NEGATIVE 11/05/2020 0901   NITRITE NEGATIVE 11/05/2020 0901   LEUKOCYTESUR MODERATE (A) 11/05/2020 0901   Sepsis Labs: @LABRCNTIP (procalcitonin:4,lacticidven:4) Microbiology: Recent Results (from the past 240 hours)  Resp Panel by RT-PCR (Flu A&B, Covid) Anterior Nasal Swab     Status: None   Collection Time: 07/28/23 11:49 AM   Specimen: Anterior Nasal Swab  Result Value Ref Range Status   SARS Coronavirus 2 by RT PCR NEGATIVE NEGATIVE Final    Comment: (NOTE) SARS-CoV-2 target nucleic acids are NOT DETECTED.  The SARS-CoV-2 RNA is generally detectable in upper respiratory specimens during the acute phase of infection. The lowest concentration of SARS-CoV-2 viral copies this assay can detect is 138 copies/mL. A negative result does not preclude SARS-Cov-2 infection and should not be used as the sole basis for treatment or other patient management decisions. A negative result may occur with  improper specimen collection/handling, submission of specimen other than nasopharyngeal swab, presence of viral mutation(s) within the areas  targeted by this assay, and inadequate number of viral copies(<138 copies/mL). A negative result must be combined with clinical observations, patient history, and epidemiological information. The expected result is Negative.  Fact Sheet for Patients:  bloggercourse.com  Fact Sheet for Healthcare Providers:  seriousbroker.it  This test is no t yet approved or cleared by the [place]  FDA and  has been authorized for detection and/or diagnosis of SARS-CoV-2 by FDA under an Emergency Use Authorization (EUA). This EUA will remain  in effect (meaning this test can be used) for the duration of the COVID-19 declaration under Section 564(b)(1) of the Act, 21 U.S.C.section 360bbb-3(b)(1), unless the authorization is terminated  or revoked sooner.       Influenza A by PCR NEGATIVE NEGATIVE Final   Influenza B by PCR NEGATIVE NEGATIVE Final    Comment: (NOTE) The Xpert Xpress SARS-CoV-2/FLU/RSV plus assay is intended as an aid in the diagnosis of influenza from Nasopharyngeal swab specimens and should not be used as a sole basis for treatment. Nasal washings and aspirates are unacceptable for Xpert Xpress SARS-CoV-2/FLU/RSV testing.  Fact Sheet for Patients: bloggercourse.com  Fact Sheet for Healthcare Providers: seriousbroker.it  This test is not yet approved or cleared by the [place]  FDA and has been authorized for detection and/or diagnosis of SARS-CoV-2 by FDA under an Emergency Use Authorization (EUA). This EUA will remain in effect (meaning this test can be used) for the duration of the COVID-19 declaration under Section 564(b)(1) of the Act, 21 U.S.C. section 360bbb-3(b)(1), unless the authorization is terminated or revoked.  Performed at [facility], [address] [place], [place] [address]   SARS Coronavirus 2 by RT PCR (hospital order, performed in  [facility] hospital lab) *cepheid single result test* Anterior Nasal Swab     Status: Abnormal   Collection Time: 07/28/23  2:27 PM   Specimen: Anterior Nasal Swab  Result Value Ref Range Status   SARS Coronavirus 2 by RT PCR POSITIVE (A) NEGATIVE Final    Comment: (NOTE) SARS-CoV-2 target nucleic acids are DETECTED  SARS-CoV-2 RNA is generally detectable in upper respiratory specimens  during the acute phase of infection.  Positive results are indicative  of the presence of the identified virus, but do not rule out bacterial infection or co-infection with other pathogens not detected by the test.  Clinical correlation with patient history and  other diagnostic information is necessary to determine patient infection status.  The expected  result is negative.  Fact Sheet for Patients:   roadlaptop.co.za   Fact Sheet for Healthcare Providers:   http://kim-miller.com/    This test is not yet approved or cleared by the [place]  FDA and  has been authorized for detection and/or diagnosis of SARS-CoV-2 by FDA under an Emergency Use Authorization (EUA).  This EUA will remain in effect (meaning this test can be used) for the duration of  the COVID-19 declaration under Section 564(b)(1)  of the Act, 21 U.S.C. section 360-bbb-3(b)(1), unless the authorization is terminated or revoked sooner.   Performed at [facility], [address] [place], [place] [address]       Radiology Studies last 3 days: CARDIAC CATHETERIZATION Result Date: 07/29/2023 Conclusions: No angiographically significant coronary artery disease. Grossly normal left ventricular systolic function, though contrast opacification of the left ventricle was suboptimal (LVEF 55-65%). Normal left ventricular filling pressure (LVEDP 9 mmHg). Recommendations: Continue conservative therapy of COVID-19 infection.  I suspect mild troponin elevation reflects supply-demand  mismatch. Obtain echocardiogram to confirm normal LVEF in the setting of troponin elevation and COVID-19 infection. Primary prevention of coronary artery disease. Anticipate discharge home tomorrow (patient does not have anyone to stay with her tonight, precluding discharge this evening). Lonni Hanson, MD [facility]  DG Chest 2 View Result Date: 07/28/2023 CLINICAL DATA:  Chest pain and shortness of breath. EXAM: CHEST - 2 VIEW COMPARISON:  Chest radiographs 07/28/2022 FINDINGS: Cardiac silhouette and mediastinal contours are within normal limits. Mild-to-moderate atherosclerotic calcifications within the aortic arch. Unchanged mild inferolateral subpleural linear scarring. No acute airspace opacity. No pleural effusion or pneumothorax. Moderate multilevel degenerative disc changes of the thoracic spine. Lower cervical spine ACDF hardware is again seen. IMPRESSION: No active cardiopulmonary disease. Electronically Signed   By: Tanda Lyons M.D.   On: 07/28/2023 14:29        Jeena Arnett, DO [facility] 07/29/2023, 6:10 PM    Dictation software may have been used to generate the above note. Typos may occur and escape review in typed/dictated notes. Please contact Dr Marsa directly for clarity if needed.  Staff may message me via secure chat in Epic  but this may not receive an immediate response,  please page me for urgent matters!  If 7PM-7AM, please contact night coverage [email or web address]

## 2023-07-29 NOTE — Progress Notes (Signed)
 PHARMACY - ANTICOAGULATION CONSULT NOTE  Pharmacy Consult for Heparin  Infusion Indication: chest pain/ACS  Allergies  Allergen Reactions   Oxcarbazepine Swelling, Nausea Only and Other (See Comments)   Celecoxib  Other (See Comments)   Haldol [Haloperidol] Other (See Comments)    Reaction: Pt felt like her neck was going to snap. Sever neck stiffness. Reaction: Pt felt like her neck was going to snap.   Haloperidol Decanoate Other (See Comments)   Haloperidol Lactate Other (See Comments)    Sever neck stiffness.   Meloxicam  Other (See Comments)    GERD   Codeine Nausea Only, Rash and Other (See Comments)   Diphenhydramine  Palpitations and Other (See Comments)    Patient Measurements: Height: 5' 3 (160 cm) Weight: 83.2 kg (183 lb 6.4 oz) IBW/kg (Calculated) : 52.4 Heparin  Dosing Weight: 70.8 kg  Vital Signs: Temp: 97.9 F (36.6 C) (01/02 2322) Temp Source: Oral (01/02 2322) BP: 90/55 (01/03 0000) Pulse Rate: 64 (01/03 0000)  Labs: Recent Labs    07/28/23 1149 07/28/23 1340 07/28/23 1802 07/28/23 2319  HGB 11.4*  --   --   --   HCT 37.2  --   --   --   PLT 324  --   --   --   APTT  --  29  --   --   LABPROT  --  14.1  --   --   INR  --  1.1  --   --   HEPARINUNFRC  --   --   --  0.17*  CREATININE 0.72  --   --   --   CKTOTAL  --  132  --   --   TROPONINIHS 85* 87* 101*  --     Estimated Creatinine Clearance: 65.9 mL/min (by C-G formula based on SCr of 0.72 mg/dL).   Medical History: Past Medical History:  Diagnosis Date   Acid reflux disease    Anemia    Asthma    Bipolar 1 disorder (HCC)    COPD (chronic obstructive pulmonary disease) (HCC)    Diabetes mellitus without complication (HCC)    History of hepatitis C 2010   took interferon x 1 year   Hypertension    Liver hemangioma    Lung nodule    Psoriasis    Psoriatic arthritis (HCC)    Rheumatoid arthritis (HCC)    Tardive dyskinesia    Wears dentures    full upper and lower     Medications:  No anticoagulation use at home  Assessment: Patient is a 73 year old female with a past medical history of diabetes, COPD, bipolar 1, asthma, anemia, GERD, HTN, hepatitis C and tardive dyskinesia who presents to chest pain, SOB, nausea, and vomiting. She initially presented to an urgent care and was brought via EMS to the ED due to elevated troponin of 85. Patient reported taking a baby aspirin  and zofran  at home. Pharmacy was consulted to initiate patient on a heparin  infusion for ACS/STEMI. Patient was not on anticoagulation at home, therefore we can monitor via heparin  levels.   Baseline INR and aPTT levels ordered.  No signs/symptoms of bleeding noted in chart. Hgb 11.4. PLT 324.  Goal of Therapy:  Heparin  level 0.3-0.7 units/ml Monitor platelets by anticoagulation protocol: Yes   Plan:  1/2:  HL @ 2319 = 0.17, SUBtherapeutic - Will order heparin  2100 units IV X 1 bolus and increase drip rate to 1100 units/hr - will recheck HL 8 hrs after rate change  Monitor  CBC daily  Taquana Bartley D, PharmD Clinical Pharmacist 07/29/2023,12:53 AM

## 2023-07-29 NOTE — Progress Notes (Signed)
 Rounding Note    Patient Name: Kathryn Ramirez Date of Encounter: 07/29/2023  [facility] Cardiologist: New  Subjective   Patient found to be COVID positive. She denies chest pain. She is still wanting to pursue heart cath today. No further N/V.  Inpatient Medications    Scheduled Meds:  ARIPiprazole   10 mg Oral Daily   aspirin  EC  81 mg Oral Daily   buPROPion   150 mg Oral Daily   insulin  aspart  0-15 Units Subcutaneous TID WC   mometasone -formoterol   2 puff Inhalation BID   oxybutynin   5 mg Oral Daily   pantoprazole   40 mg Oral QPM   rosuvastatin   40 mg Oral QHS   traZODone   200 mg Oral QHS   Continuous Infusions:  heparin  1,100 Units/hr (07/29/23 0042)   PRN Meds: acetaminophen , albuterol , nitroGLYCERIN , ondansetron  (ZOFRAN ) IV   Vital Signs    Vitals:   07/29/23 0330 07/29/23 0400 07/29/23 0700 07/29/23 0746  BP: 94/69 92/66  105/73  Pulse: 66 70  75  Resp:  18  15  Temp:  98 F (36.7 C)  98.1 F (36.7 C)  TempSrc:  Oral  Oral  SpO2: 96% 91% 97% 96%  Weight:      Height:       No intake or output data in the 24 hours ending 07/29/23 0816    07/28/2023    1:36 PM 04/30/2023    2:17 PM 03/18/2023    8:07 AM  Last 3 Weights  Weight (lbs) 183 lb 6.4 oz 162 lb 162 lb  Weight (kg) 83.19 kg 73.483 kg 73.483 kg      Telemetry    NSR PVCs - Personally Reviewed  ECG    No new - Personally Reviewed  Physical Exam   GEN: No acute distress.   Neck: No JVD Cardiac: RRR, no murmurs, rubs, or gallops.  Respiratory: Clear to auscultation bilaterally. GI: Soft, nontender, non-distended  MS: No edema; No deformity. Neuro:  Nonfocal  Psych: Normal affect   Labs    High Sensitivity Troponin:   Recent Labs  Lab 07/28/23 1149 07/28/23 1340 07/28/23 1802  TROPONINIHS 85* 87* 101*     Chemistry Recent Labs  Lab 07/28/23 1149  NA 134*  K 4.1  CL 101  CO2 24  GLUCOSE 91  BUN 17  CREATININE 0.72  CALCIUM  9.0  PROT 7.5  ALBUMIN 3.9   AST 18  ALT 17  ALKPHOS 44  BILITOT 0.8  GFRNONAA >60  ANIONGAP 9    Lipids  Recent Labs  Lab 07/29/23 0439  CHOL 111  TRIG 115  HDL 51  LDLCALC 37  CHOLHDL 2.2    Hematology Recent Labs  Lab 07/28/23 1149 07/29/23 0439  WBC 10.5 7.1  RBC 4.80 4.45  HGB 11.4* 10.7*  HCT 37.2 35.8*  MCV 77.5* 80.4  MCH 23.8* 24.0*  MCHC 30.6 29.9*  RDW 15.4 15.4  PLT 324 286   Thyroid  Recent Labs  Lab 07/28/23 1530  TSH 0.913    BNPNo results for input(s): BNP, PROBNP in the last 168 hours.  DDimer No results for input(s): DDIMER in the last 168 hours.   Radiology    DG Chest 2 View Result Date: 07/28/2023 CLINICAL DATA:  Chest pain and shortness of breath. EXAM: CHEST - 2 VIEW COMPARISON:  Chest radiographs 07/28/2022 FINDINGS: Cardiac silhouette and mediastinal contours are within normal limits. Mild-to-moderate atherosclerotic calcifications within the aortic arch. Unchanged mild inferolateral subpleural  linear scarring. No acute airspace opacity. No pleural effusion or pneumothorax. Moderate multilevel degenerative disc changes of the thoracic spine. Lower cervical spine ACDF hardware is again seen. IMPRESSION: No active cardiopulmonary disease. Electronically Signed   By: Tanda Lyons M.D.   On: 07/28/2023 14:29    Cardiac Studies   Echo 04/2023  Summary   1. The left ventricle is normal in size with normal wall thickness.    2. The left ventricular systolic function is normal, LVEF is visually  estimated at > 55%.    3. The right ventricle is normal in size, with normal systolic function.    4. There are no significant valvular abnormalities.    Myoview  lexiscan  04/2023  IMPRESSIONS:  __________________________________________________________________  -Normal myocardial perfusion study  - No evidence of any significant ischemia or scar  - Left ventricular systolic function is normal. Post stress the ejection fraction is > 65%. The left ventricle is normal  in size.  -Unchanged 2.6 cm left breast mass with apparent internal fat attenuation, which has been stable over multiple prior mammograms and may represent a hamartoma. Atherosclerotic calcifications of the thoracic aorta and coronary arteries.    Stress test    Stress ECG: Non-diagnostic ECG portion of pharmacologic stress test.   Nuclear images are reported separately.  Resting ECG  - ECG is normal.  - Baseline shows normal sinus rhythm   Stress Findings  - A pharmacological stress test was performed using regadenoson  0.4mg  IV The patient with a peak HR of 95 bpm (63% of MPHR)  - The patient reported no symptoms during the stress test. The patient reported no symptoms during recovery.  - Blood pressure demonstrated a normal response and heart rate demonstrated a normal response to stress. The patient's heart rate recovery was normal.   Stress ECG  - No significant ST segment changes were noted during stress or recovery  - There were no arrhythmias during stress.  - There were no arrhythmias during recovery. Non-diagnostic ECG portion of pharmacologic stress test.   Patient Profile     73 y.o. female with a hx of MV prolapse, coronary artery calcium  on CT, HLD, diabetes, COPD, bipolar 1, asthma, GERD, HTN, tardive dyskinesia who is being seen 07/28/2023 for the evaluation of chest pain   Assessment & Plan    NSTEMI - patient presented to urgent care with nasuea, vomiting, chest pain and SOB. Hs trop 57 and she was sent to the ER. - in the ER HS trop trend flat (85>87>101)and EKG non-ischemic started on IV heparin  and found to be COVID+ - Chest CT showed  mild coronary calcifications - with RF for CAD -  echo ordered - she had recent stress test and echo at [facility] which were unremarkable - no active chest pain, no further N/V - continue ASA 81mg  daily and Crestor  40mg  daily - plan for heart cath today Risks and benefits of cardiac catheterization have been discussed with the patient.   These include bleeding, infection, kidney damage, stroke, heart attack, death.  The patient understands these risks and is willing to proceed.   HLD - LDL 37 - continue Crestor  40mg  daily   DM2 - SSI per IM  For questions or updates, please contact [facility] Please consult [email or web address] for contact info under        Signed, Lawrencia Mauney VEAR Fishman, PA-C  07/29/2023, 8:16 AM

## 2023-07-29 NOTE — Progress Notes (Signed)
 PHARMACY - ANTICOAGULATION CONSULT NOTE  Pharmacy Consult for Heparin  Infusion Indication: chest pain/ACS  Allergies  Allergen Reactions   Oxcarbazepine Swelling, Nausea Only and Other (See Comments)   Celecoxib  Other (See Comments)   Haldol [Haloperidol] Other (See Comments)    Reaction: Pt felt like her neck was going to snap. Sever neck stiffness. Reaction: Pt felt like her neck was going to snap.   Haloperidol Decanoate Other (See Comments)   Haloperidol Lactate Other (See Comments)    Sever neck stiffness.   Meloxicam  Other (See Comments)    GERD   Codeine Nausea Only, Rash and Other (See Comments)   Diphenhydramine  Palpitations and Other (See Comments)    Patient Measurements: Height: 5' 3 (160 cm) Weight: 83.2 kg (183 lb 6.4 oz) IBW/kg (Calculated) : 52.4 Heparin  Dosing Weight: 70.8 kg  Vital Signs: Temp: 98.1 F (36.7 C) (01/03 0746) Temp Source: Oral (01/03 0746) BP: 105/73 (01/03 0746) Pulse Rate: 75 (01/03 0746)  Labs: Recent Labs    07/28/23 1149 07/28/23 1340 07/28/23 1802 07/28/23 2319 07/29/23 0439 07/29/23 0832  HGB 11.4*  --   --   --  10.7*  --   HCT 37.2  --   --   --  35.8*  --   PLT 324  --   --   --  286  --   APTT  --  29  --   --   --   --   LABPROT  --  14.1  --   --   --   --   INR  --  1.1  --   --   --   --   HEPARINUNFRC  --   --   --  0.17*  --  >1.10*  CREATININE 0.72  --   --   --   --   --   CKTOTAL  --  132  --   --   --   --   TROPONINIHS 85* 87* 101*  --   --   --     Estimated Creatinine Clearance: 65.9 mL/min (by C-G formula based on SCr of 0.72 mg/dL).   Medical History: Past Medical History:  Diagnosis Date   Acid reflux disease    Anemia    Asthma    Bipolar 1 disorder (HCC)    COPD (chronic obstructive pulmonary disease) (HCC)    Diabetes mellitus without complication (HCC)    History of hepatitis C 2010   took interferon x 1 year   Hypertension    Liver hemangioma    Lung nodule    Psoriasis     Psoriatic arthritis (HCC)    Rheumatoid arthritis (HCC)    Tardive dyskinesia    Wears dentures    full upper and lower   Baseline INR and aPTT  Medications:  No anticoagulation use at home per chart review  Assessment: Patient is a 73 year old female with a past medical history of diabetes, COPD, bipolar 1, asthma, anemia, GERD, HTN, hepatitis C and tardive dyskinesia who presents to chest pain, SOB, nausea, and vomiting. She initially presented to an urgent care and was brought via EMS to the ED due to elevated troponin of 85. Patient reported taking a baby aspirin  and zofran  at home. Pharmacy was consulted to initiate patient on a heparin  infusion for ACS/STEMI. Heparin  started at 1400 on 1/2. Pharmacy has been consulted to monitor and dose heparin .  Goal of Therapy:  Heparin  level 0.3-0.7 units/ml Monitor platelets  by anticoagulation protocol: Yes  Heparin  Levels Date/Time HL Clinical Assessment  1/2@2319  0.17 SUBtherapeutic  1/3@0832  > 1.10 SUPRAtherapeutic           Plan:  Discussed with nurse, heparin  drawn appropriately Already held so repeat HL not drawn Hold heparin  for 1 hour (stopped at 0954) Restart heparin  gtt @ 950 units/hour at 1100 Planned left heart cath today at 1330; follow afterwards to continue heparin  gtt Re-check heparin  levels in 8 hours from rate change if still appropriate due to planned cath Monitor CBC and platelets daily  Alfonso MARLA Buys, PharmD Pharmacy Resident  07/29/2023 9:50 AM

## 2023-07-29 NOTE — ED Notes (Signed)
Pt ambulates w/ steady gait to BR

## 2023-07-29 NOTE — Hospital Course (Addendum)
 Hospital course / significant events:   HPI: Kathryn Ramirez is a 73 y.o. female with medical history significant of asthma/COPD, HTN, IIDM, RA, tardive dyskinesia, chronic hepatitis C status post interferon treatment, GERD, presented 07/28/23 with new onset of chest pain since that morning, assoc w/ N/V x1. Sent to ED from urgent care.   01/02: admitted for NSTEMI, started on heparin  gtt, cardiology consulted  01/03: cardiac cath today, per report normal coronary arteries      Consultants:  Cardiology   Procedures/Surgeries: 07/29/23 [facility]  Conclusions: No angiographically significant coronary artery disease. Grossly normal left ventricular systolic function, though contrast opacification of the left ventricle was suboptimal (LVEF 55-65%). Normal left ventricular filling pressure (LVEDP 9 mmHg). Recommendations: Continue conservative therapy of COVID-19 infection.  I suspect mild troponin elevation reflects supply-demand mismatch. Obtain echocardiogram to confirm normal LVEF in the setting of troponin elevation and COVID-19 infection. Primary prevention of coronary artery disease. Anticipate discharge home tomorrow (patient does not have anyone to stay with her tonight, precluding discharge this evening).     ASSESSMENT & PLAN:   NSTEMI Per cath report, elevated troponin with normal coronary arteries  Care everywhere showed patient had a normal stress test October/November 2024 with [facility], patient however does not recall such study done. Continue ACS medication including heparin  drip, aspirin , increase Crestor  dosage to 40 mg daily Echocardiogram pending  Risk factor modification, check A1c and lipid panel.  Patient reported that she quit smoking 7 years ago Appreciate Cardiology recs: continue tx COVID, echo pending, primary prevention  Anticipate discharge home tomorrow AM  (+)COVID Likely cause of supply/demand mismatch cause elevation troponin  No hypoxia  Supportive care     IIDM Hold off metformin , as will receive IV contrast for ischemia study SSI   Asthma/COPD No acute exacerbation or hypoxia continue ICS and LABA, as needed albuterol    Anxiety/depression On multiple SSRI, mentation at baseline.    Class 1 obesity based on BMI: Body mass index is 32.49 kg/m.  Underweight - under 18  overweight - 25 to 29 obese - 30 or more Class 1 obesity: BMI of 30.0 to 34 Class 2 obesity: BMI of 35.0 to 39 Class 3 obesity: BMI of 40.0 to 49 Super Morbid Obesity: BMI 50-59 Super-super Morbid Obesity: BMI 60+ Significantly low or high BMI is associated with higher medical risk.  Weight management advised as adjunct to other disease management and risk reduction treatments    DVT prophylaxis: lovenox  IV fluids: no continuous IV fluids  Nutrition: plan for cardiac diet whe nout of recovery from cath  Central lines / invasive devices: none  Code Status: FULL CODE ACP documentation reviewed:  none on file in [facility]  TOC needs: TBD Barriers to dispo / significant pending items: cardiac clearance prior to dc - anticipate home tomorrow

## 2023-07-29 NOTE — Inpatient Diabetes Management (Signed)
 Inpatient Diabetes Program Recommendations  AACE/ADA: New Consensus Statement on Inpatient Glycemic Control (2015)  Target Ranges:  Prepandial:   less than 140 mg/dL      Peak postprandial:   less than 180 mg/dL (1-2 hours)      Critically ill patients:  140 - 180 mg/dL   Lab Results  Component Value Date   GLUCAP 48 (L) 07/29/2023   HGBA1C 6.6 (H) 06/01/2017    Review of Glycemic Control  Latest Reference Range & Units 07/28/23 17:32 07/28/23 21:09 07/29/23 07:26 07/29/23 12:11 07/29/23 13:43  Glucose-Capillary 70 - 99 mg/dL 83 86 88 91 48 (L)   Diabetes history: DM 2 Outpatient Diabetes medications:  Ozempic 1 mg weekly Synjardy  XR 25-1000 mg daily Current orders for Inpatient glycemic control:  Novolog  moderate q 4 hours  Inpatient Diabetes Program Recommendations:    No insulin  needed-  Consider holding Novolog  insulin  as patient does not appear to need.   Thanks,  Randall Bullocks, RN, BC-ADM Inpatient Diabetes Coordinator Pager [phone]  (8a-5p)

## 2023-07-29 NOTE — Interval H&P Note (Signed)
 History and Physical Interval Note:  07/29/2023 3:14 PM  Kathryn Ramirez  has presented today for surgery, with the diagnosis of chest pain.  The various methods of treatment have been discussed with the patient and family. After consideration of risks, benefits and other options for treatment, the patient has consented to  Procedure(s): LEFT HEART CATH AND CORONARY ANGIOGRAPHY (N/A) as a surgical intervention.  The patient's history has been reviewed, patient examined, no change in status, stable for surgery.  I have reviewed the patient's chart and labs.  Questions were answered to the patient's satisfaction.    Cath Lab Visit (complete for each Cath Lab visit)  Clinical Evaluation Leading to the Procedure:   ACS: Yes.    Non-ACS:  N/A  [name]

## 2023-07-30 ENCOUNTER — Inpatient Hospital Stay (HOSPITAL_COMMUNITY)
Admit: 2023-07-30 | Discharge: 2023-07-30 | Disposition: A | Discharge status: Home / Self Care | Payer: 59 | Attending: Internal Medicine | Admitting: Internal Medicine

## 2023-07-30 DIAGNOSIS — I2489 Other forms of acute ischemic heart disease: Secondary | ICD-10-CM | POA: Diagnosis not present

## 2023-07-30 DIAGNOSIS — I214 Non-ST elevation (NSTEMI) myocardial infarction: Secondary | ICD-10-CM

## 2023-07-30 LAB — BASIC METABOLIC PANEL
Anion gap: 9 (ref 5–15)
BUN: 14 mg/dL (ref 8–23)
CO2: 25 mmol/L (ref 22–32)
Calcium: 9.1 mg/dL (ref 8.9–10.3)
Chloride: 105 mmol/L (ref 98–111)
Creatinine, Ser: 0.71 mg/dL (ref 0.44–1.00)
GFR, Estimated: >60 mL/min (ref >60)
Glucose, Bld: 101 mg/dL — ABNORMAL HIGH (ref 70–99)
Potassium: 4.4 mmol/L (ref 3.5–5.1)
Sodium: 139 mmol/L (ref 135–145)

## 2023-07-30 LAB — CBC
HCT: 38.5 % (ref 36.0–46.0)
Hemoglobin: 12 g/dL (ref 12.0–15.0)
MCH: 24.5 pg — ABNORMAL LOW (ref 26.0–34.0)
MCHC: 31.2 g/dL (ref 30.0–36.0)
MCV: 78.7 fL — ABNORMAL LOW (ref 80.0–100.0)
Platelets: 314 10*3/uL (ref 150–400)
RBC: 4.89 MIL/uL (ref 3.87–5.11)
RDW: 15.5 % (ref 11.5–15.5)
WBC: 9 10*3/uL (ref 4.0–10.5)
nRBC: 0 % (ref 0.0–0.2)

## 2023-07-30 LAB — ECHOCARDIOGRAM COMPLETE
AR max vel: 2.24 cm2
AV Peak grad: 10.2 mm[Hg]
Ao pk vel: 1.6 m/s
Area-P 1/2: 3.39 cm2
Height: 63 in
S' Lateral: 2.9 cm
Weight: 2934.4 [oz_av]

## 2023-07-30 LAB — LIPOPROTEIN A (LPA): Lipoprotein (a): 80 nmol/L — ABNORMAL HIGH (ref <75.0)

## 2023-07-30 LAB — GLUCOSE, CAPILLARY: Glucose-Capillary: 155 mg/dL — ABNORMAL HIGH (ref 70–99)

## 2023-07-30 MED ORDER — ASPIRIN 81 MG PO TBEC: 81.0000 mg | DELAYED_RELEASE_TABLET | Freq: Every day | ORAL | Status: AC

## 2023-07-30 NOTE — Progress Notes (Signed)
  Echocardiogram 2D Echocardiogram has been performed.  Kathryn Ramirez 07/30/2023, 9:16 AM

## 2023-07-30 NOTE — Discharge Summary (Signed)
 Physician Discharge Summary   Patient: Kathryn Ramirez MRN: [id number]  DOB: 1950-09-25   Admit:     Date of Admission: 07/28/2023 Admitted from: home   Discharge: Date of discharge: 07/30/23 Disposition: Home Condition at discharge: good  CODE STATUS: FULL CODE     Discharge Physician: Laneta Blunt, DO [facility]     PCP: [facility]  Recommendations for Outpatient Follow-up:  Follow up with PCP [facility] in 1-2 weeks for hospital follow up  Follow up with cardiology - [facility] will call patient    Discharge Instructions     Ambulatory Referral for Lung Cancer Scre   Complete by: As directed    Increase activity slowly   Complete by: As directed          Discharge Diagnoses: Principal Problem: NSTEMI (non-ST elevated myocardial infarction) elevation I ntroponin d/t demand ischemia, ACS ruled out [facility] course / significant events:   HPI: [name] is a 73 y.o. female with medical history significant of asthma/COPD, HTN, IIDM, RA, tardive dyskinesia, chronic hepatitis C status post interferon treatment, GERD, presented 07/28/23 with new onset of chest pain since that morning, assoc w/ N/V x1. Sent to ED from urgent care.   01/02: admitted for NSTEMI, started on heparin  gtt, cardiology consulted  01/03: cardiac cath today, normal coronary arteries  01/04: improving, no concerns on echocardiogram, cardio recs to continue ASA/Statin and follow outpatient, supportive care for COVID      Consultants:  Cardiology   Procedures/Surgeries: 07/29/23 [facility]  Conclusions: No angiographically significant coronary artery disease. Grossly normal left ventricular systolic function, though contrast opacification of the left ventricle was suboptimal (LVEF 55-65%). Normal left ventricular filling pressure (LVEDP 9 mmHg). Recommendations: Continue conservative therapy of COVID-19 infection.  I suspect mild  troponin elevation reflects supply-demand mismatch. Obtain echocardiogram to confirm normal LVEF in the setting of troponin elevation and COVID-19 infection. Primary prevention of coronary artery disease. Anticipate discharge home tomorrow (patient does not have anyone to stay with her tonight, precluding discharge this evening).     ASSESSMENT & PLAN:   Demand ischemia - elevated troponin with normal coronary arteries, in settin gof COVID19 illness  Care everywhere showed patient had a normal stress test October/November 2024 with [facility], patient however does not recall such study done. Cardiology has low suspicion for myocarditis  Continue ASA/statin Echocardiogram no concerns   Risk factor modification, check A1c and lipid panel.  Patient reported that she quit smoking 7 years ago  (+)COVID Likely cause of supply/demand mismatch cause elevation troponin  No hypoxia  Supportive care    IIDM Hold off metformin , as will receive IV contrast for ischemia study SSI   Asthma/COPD No acute exacerbation or hypoxia continue ICS and LABA, as needed albuterol    Anxiety/depression On multiple SSRI, mentation at baseline.    Class 1 obesity based on BMI: Body mass index is 32.49 kg/m.  Underweight - under 18  overweight - 25 to 29 obese - 30 or more Class 1 obesity: BMI of 30.0 to 34 Class 2 obesity: BMI of 35.0 to 39 Class 3 obesity: BMI of 40.0 to 49 Super Morbid Obesity: BMI 50-59 Super-super Morbid Obesity: BMI 60+ Significantly low or high BMI is associated with higher medical risk.  Weight management advised as adjunct to other disease management and risk reduction treatments             Discharge Instructions  Allergies as of 07/30/2023       Reactions   Oxcarbazepine Swelling, Nausea Only, Other (See Comments)   Celecoxib  Other (See Comments)   Haldol [haloperidol] Other (See Comments)   Reaction: Pt felt like her neck was going to snap. Sever neck  stiffness. Reaction: Pt felt like her neck was going to snap.   Haloperidol Decanoate Other (See Comments)   Haloperidol Lactate Other (See Comments)   Sever neck stiffness.   Meloxicam  Other (See Comments)   GERD   Codeine Nausea Only, Rash, Other (See Comments)   Diphenhydramine  Palpitations, Other (See Comments)        Medication List     STOP taking these medications    gabapentin  300 MG capsule Commonly known as: NEURONTIN    lidocaine  2 % solution Commonly known as: XYLOCAINE        TAKE these medications    acetaminophen  500 MG tablet Commonly known as: TYLENOL  Take 500 mg by mouth 2 (two) times daily.   albuterol  108 (90 Base) MCG/ACT inhaler Commonly known as: VENTOLIN  HFA INHALE 2 PUFFS EVERY 4 HOURS AS NEEDED FOR WHEEZING FOR SHORTNESS OF BREATH   ARIPiprazole  10 MG tablet Commonly known as: ABILIFY  Take 10 mg by mouth daily.   ascorbic acid 500 MG tablet Commonly known as: VITAMIN C Take 500 mg by mouth daily.   aspirin  EC 81 MG tablet Take 1 tablet (81 mg total) by mouth daily. Swallow whole. Start taking on: July 31, 2023   buPROPion  150 MG 24 hr tablet Commonly known as: WELLBUTRIN  XL Take 150 mg by mouth daily.   calcipotriene  0.005 % cream Commonly known as: DOVONOX Apply 1 Application topically 2 (two) times daily.   cyanocobalamin 1000 MCG tablet Commonly known as: VITAMIN B12 Take 1,000 mcg by mouth daily.   LORazepam 0.5 MG tablet Commonly known as: ATIVAN Take 0.25 mg by mouth every morning.   oxybutynin  5 MG tablet Commonly known as: DITROPAN  Take 5 mg by mouth every morning.   Ozempic (1 MG/DOSE) 4 MG/3ML Sopn Generic drug: Semaglutide (1 MG/DOSE) INJECT 1 MG UNDER THE SKIN EVERY 7 DAYS   pantoprazole  40 MG tablet Commonly known as: PROTONIX  TAKE 1 TABLET (40 MG TOTAL) BY MOUTH EVERY EVENING.   rosuvastatin  10 MG tablet Commonly known as: CRESTOR  Take 1 tablet (10 mg total) by mouth at bedtime.   Symbicort  160-4.5 MCG/ACT inhaler Generic drug: budesonide-formoterol  Inhale 2 puffs into the lungs 2 (two) times daily.   Synjardy  XR 25-1000 MG Tb24 Generic drug: Empagliflozin -metFORMIN  HCl ER Take 1 tablet by mouth daily.   traZODone  100 MG tablet Commonly known as: DESYREL  Take 200 mg by mouth at bedtime.   Vitamin D -1000 Max St 25 MCG (1000 UT) tablet Generic drug: Cholecalciferol Take 1 tablet by mouth daily.          Allergies  Allergen Reactions   Oxcarbazepine Swelling, Nausea Only and Other (See Comments)   Celecoxib  Other (See Comments)   Haldol [Haloperidol] Other (See Comments)    Reaction: Pt felt like her neck was going to snap. Sever neck stiffness. Reaction: Pt felt like her neck was going to snap.   Haloperidol Decanoate Other (See Comments)   Haloperidol Lactate Other (See Comments)    Sever neck stiffness.   Meloxicam  Other (See Comments)    GERD   Codeine Nausea Only, Rash and Other (See Comments)   Diphenhydramine  Palpitations and Other (See Comments)     Subjective: pt reports feeling better today,  some fatigue but no chest pain, no shortness of breath    Discharge Exam: BP 122/80 (BP Location: Left Arm)   Pulse 77   Temp 98.1 F (36.7 C)   Resp 16   Ht 5' 3 (1.6 m)   Wt 83.2 kg   SpO2 95%   BMI 32.49 kg/m  General: Pt is alert, awake, not in acute distress Cardiovascular: RRR, S1/S2 +, no rubs, no gallops Respiratory: CTA bilaterally, no wheezing, no rhonchi Abdominal: Soft, NT, ND, bowel sounds + Extremities: no edema, no cyanosis     The results of significant diagnostics from this hospitalization (including imaging, microbiology, ancillary and laboratory) are listed below for reference.     Microbiology: Recent Results (from the past 240 hours)  Resp Panel by RT-PCR (Flu A&B, Covid) Anterior Nasal Swab     Status: None   Collection Time: 07/28/23 11:49 AM   Specimen: Anterior Nasal Swab  Result Value Ref Range Status    SARS Coronavirus 2 by RT PCR NEGATIVE NEGATIVE Final    Comment: (NOTE) SARS-CoV-2 target nucleic acids are NOT DETECTED.  The SARS-CoV-2 RNA is generally detectable in upper respiratory specimens during the acute phase of infection. The lowest concentration of SARS-CoV-2 viral copies this assay can detect is 138 copies/mL. A negative result does not preclude SARS-Cov-2 infection and should not be used as the sole basis for treatment or other patient management decisions. A negative result may occur with  improper specimen collection/handling, submission of specimen other than nasopharyngeal swab, presence of viral mutation(s) within the areas targeted by this assay, and inadequate number of viral copies(<138 copies/mL). A negative result must be combined with clinical observations, patient history, and epidemiological information. The expected result is Negative.  Fact Sheet for Patients:  bloggercourse.com  Fact Sheet for Healthcare Providers:  seriousbroker.it  This test is no t yet approved or cleared by the [place]  FDA and  has been authorized for detection and/or diagnosis of SARS-CoV-2 by FDA under an Emergency Use Authorization (EUA). This EUA will remain  in effect (meaning this test can be used) for the duration of the COVID-19 declaration under Section 564(b)(1) of the Act, 21 U.S.C.section 360bbb-3(b)(1), unless the authorization is terminated  or revoked sooner.       Influenza A by PCR NEGATIVE NEGATIVE Final   Influenza B by PCR NEGATIVE NEGATIVE Final    Comment: (NOTE) The Xpert Xpress SARS-CoV-2/FLU/RSV plus assay is intended as an aid in the diagnosis of influenza from Nasopharyngeal swab specimens and should not be used as a sole basis for treatment. Nasal washings and aspirates are unacceptable for Xpert Xpress SARS-CoV-2/FLU/RSV testing.  Fact Sheet for  Patients: bloggercourse.com  Fact Sheet for Healthcare Providers: seriousbroker.it  This test is not yet approved or cleared by the [place]  FDA and has been authorized for detection and/or diagnosis of SARS-CoV-2 by FDA under an Emergency Use Authorization (EUA). This EUA will remain in effect (meaning this test can be used) for the duration of the COVID-19 declaration under Section 564(b)(1) of the Act, 21 U.S.C. section 360bbb-3(b)(1), unless the authorization is terminated or revoked.  Performed at [facility], [address] [place], [place] [address]   SARS Coronavirus 2 by RT PCR (hospital order, performed in [facility] hospital lab) *cepheid single result test* Anterior Nasal Swab     Status: Abnormal   Collection Time: 07/28/23  2:27 PM   Specimen: Anterior Nasal Swab  Result Value Ref Range Status  SARS Coronavirus 2 by RT PCR POSITIVE (A) NEGATIVE Final    Comment: (NOTE) SARS-CoV-2 target nucleic acids are DETECTED  SARS-CoV-2 RNA is generally detectable in upper respiratory specimens  during the acute phase of infection.  Positive results are indicative  of the presence of the identified virus, but do not rule out bacterial infection or co-infection with other pathogens not detected by the test.  Clinical correlation with patient history and  other diagnostic information is necessary to determine patient infection status.  The expected result is negative.  Fact Sheet for Patients:   roadlaptop.co.za   Fact Sheet for Healthcare Providers:   http://kim-miller.com/    This test is not yet approved or cleared by the [place]  FDA and  has been authorized for detection and/or diagnosis of SARS-CoV-2 by FDA under an Emergency Use Authorization (EUA).  This EUA will remain in effect (meaning this test can be used) for the duration of  the COVID-19  declaration under Section 564(b)(1)  of the Act, 21 U.S.C. section 360-bbb-3(b)(1), unless the authorization is terminated or revoked sooner.   Performed at [facility], [address] [place], [place] [address]      Labs: BNP (last 3 results) No results for input(s): BNP in the last 8760 hours. Basic Metabolic Panel: Recent Labs  Lab 07/28/23 1149 07/30/23 0428  NA 134* 139  K 4.1 4.4  CL 101 105  CO2 24 25  GLUCOSE 91 101*  BUN 17 14  CREATININE 0.72 0.71  CALCIUM  9.0 9.1   Liver Function Tests: Recent Labs  Lab 07/28/23 1149  AST 18  ALT 17  ALKPHOS 44  BILITOT 0.8  PROT 7.5  ALBUMIN 3.9   No results for input(s): LIPASE, AMYLASE in the last 168 hours. No results for input(s): AMMONIA in the last 168 hours. CBC: Recent Labs  Lab 07/28/23 1149 07/29/23 0439 07/30/23 0428  WBC 10.5 7.1 9.0  NEUTROABS 8.0*  --   --   HGB 11.4* 10.7* 12.0  HCT 37.2 35.8* 38.5  MCV 77.5* 80.4 78.7*  PLT 324 286 314   Cardiac Enzymes: Recent Labs  Lab 07/28/23 1340  CKTOTAL 132   BNP: Invalid input(s): POCBNP CBG: Recent Labs  Lab 07/29/23 1345 07/29/23 1433 07/29/23 1633 07/29/23 2057 07/30/23 0925  GLUCAP 62* 112* 76 113* 155*   D-Dimer No results for input(s): DDIMER in the last 72 hours. Hgb A1c Recent Labs    07/28/23 1340  HGBA1C 6.1*   Lipid Profile Recent Labs    07/29/23 0439  CHOL 111  HDL 51  LDLCALC 37  TRIG 115  CHOLHDL 2.2   Thyroid function studies Recent Labs    07/28/23 1530  TSH 0.913   Anemia work up No results for input(s): VITAMINB12, FOLATE, FERRITIN, TIBC, IRON, RETICCTPCT in the last 72 hours. Urinalysis    Component Value Date/Time   COLORURINE YELLOW 11/05/2020 0901   APPEARANCEUR HAZY (A) 11/05/2020 0901   APPEARANCEUR Clear 08/19/2015 1031   LABSPEC <1.005 (L) 11/05/2020 0901   PHURINE 5.5 11/05/2020 0901   GLUCOSEU 500 (A) 11/05/2020 0901   HGBUR LARGE (A) 11/05/2020  0901   BILIRUBINUR NEGATIVE 11/05/2020 0901   BILIRUBINUR Negative 08/19/2015 1031   KETONESUR NEGATIVE 11/05/2020 0901   PROTEINUR NEGATIVE 11/05/2020 0901   NITRITE NEGATIVE 11/05/2020 0901   LEUKOCYTESUR MODERATE (A) 11/05/2020 0901   Sepsis Labs Recent Labs  Lab 07/28/23 1149 07/29/23 0439 07/30/23 0428  WBC 10.5 7.1 9.0   Microbiology  Recent Results (from the past 240 hours)  Resp Panel by RT-PCR (Flu A&B, Covid) Anterior Nasal Swab     Status: None   Collection Time: 07/28/23 11:49 AM   Specimen: Anterior Nasal Swab  Result Value Ref Range Status   SARS Coronavirus 2 by RT PCR NEGATIVE NEGATIVE Final    Comment: (NOTE) SARS-CoV-2 target nucleic acids are NOT DETECTED.  The SARS-CoV-2 RNA is generally detectable in upper respiratory specimens during the acute phase of infection. The lowest concentration of SARS-CoV-2 viral copies this assay can detect is 138 copies/mL. A negative result does not preclude SARS-Cov-2 infection and should not be used as the sole basis for treatment or other patient management decisions. A negative result may occur with  improper specimen collection/handling, submission of specimen other than nasopharyngeal swab, presence of viral mutation(s) within the areas targeted by this assay, and inadequate number of viral copies(<138 copies/mL). A negative result must be combined with clinical observations, patient history, and epidemiological information. The expected result is Negative.  Fact Sheet for Patients:  bloggercourse.com  Fact Sheet for Healthcare Providers:  seriousbroker.it  This test is no t yet approved or cleared by the [place]  FDA and  has been authorized for detection and/or diagnosis of SARS-CoV-2 by FDA under an Emergency Use Authorization (EUA). This EUA will remain  in effect (meaning this test can be used) for the duration of the COVID-19 declaration under Section  564(b)(1) of the Act, 21 U.S.C.section 360bbb-3(b)(1), unless the authorization is terminated  or revoked sooner.       Influenza A by PCR NEGATIVE NEGATIVE Final   Influenza B by PCR NEGATIVE NEGATIVE Final    Comment: (NOTE) The Xpert Xpress SARS-CoV-2/FLU/RSV plus assay is intended as an aid in the diagnosis of influenza from Nasopharyngeal swab specimens and should not be used as a sole basis for treatment. Nasal washings and aspirates are unacceptable for Xpert Xpress SARS-CoV-2/FLU/RSV testing.  Fact Sheet for Patients: bloggercourse.com  Fact Sheet for Healthcare Providers: seriousbroker.it  This test is not yet approved or cleared by the [place]  FDA and has been authorized for detection and/or diagnosis of SARS-CoV-2 by FDA under an Emergency Use Authorization (EUA). This EUA will remain in effect (meaning this test can be used) for the duration of the COVID-19 declaration under Section 564(b)(1) of the Act, 21 U.S.C. section 360bbb-3(b)(1), unless the authorization is terminated or revoked.  Performed at [facility], [address] [place], [place] [address]   SARS Coronavirus 2 by RT PCR (hospital order, performed in [facility] hospital lab) *cepheid single result test* Anterior Nasal Swab     Status: Abnormal   Collection Time: 07/28/23  2:27 PM   Specimen: Anterior Nasal Swab  Result Value Ref Range Status   SARS Coronavirus 2 by RT PCR POSITIVE (A) NEGATIVE Final    Comment: (NOTE) SARS-CoV-2 target nucleic acids are DETECTED  SARS-CoV-2 RNA is generally detectable in upper respiratory specimens  during the acute phase of infection.  Positive results are indicative  of the presence of the identified virus, but do not rule out bacterial infection or co-infection with other pathogens not detected by the test.  Clinical correlation with patient history and  other diagnostic information is  necessary to determine patient infection status.  The expected result is negative.  Fact Sheet for Patients:   roadlaptop.co.za   Fact Sheet for Healthcare Providers:   http://kim-miller.com/    This test is not yet approved or cleared  by the [place]  FDA and  has been authorized for detection and/or diagnosis of SARS-CoV-2 by FDA under an Emergency Use Authorization (EUA).  This EUA will remain in effect (meaning this test can be used) for the duration of  the COVID-19 declaration under Section 564(b)(1)  of the Act, 21 U.S.C. section 360-bbb-3(b)(1), unless the authorization is terminated or revoked sooner.   Performed at [facility], [address] [place], [place] [address]    Imaging ECHOCARDIOGRAM COMPLETE Result Date: 07/30/2023    ECHOCARDIOGRAM REPORT   Patient Name:   DAYSI BOGGAN Date of Exam: 07/30/2023 Medical Rec #:  [id number]       Height:       63.0 in Accession #:    [id number]      Weight:       183.4 lb Date of Birth:  05-Jul-1951      BSA:          1.864 m Patient Age:    72 years        BP:           98/75 mmHg Patient Gender: F               HR:           77 bpm. Exam Location:  [facility] Procedure: 2D Echo Indications:     NSTEMI I21.4  History:         Patient has no prior history of Echocardiogram examinations.  Sonographer:     Thedora Louder RDCS, FASE Referring Phys:  [id number] Diagnosing Phys: Darryle Decent MD IMPRESSIONS  1. Left ventricular ejection fraction, by estimation, is 60 to 65%. The left ventricle has normal function. The left ventricle has no regional wall motion abnormalities. Left ventricular diastolic parameters were normal.  2. Right ventricular systolic function is normal. The right ventricular size is normal. Tricuspid regurgitation signal is inadequate for assessing PA pressure.  3. The mitral valve is grossly normal. Trivial mitral valve regurgitation. No evidence of mitral  stenosis.  4. The aortic valve is tricuspid. Aortic valve regurgitation is not visualized. No aortic stenosis is present.  5. The inferior vena cava is normal in size with greater than 50% respiratory variability, suggesting right atrial pressure of 3 mmHg. Conclusion(s)/Recommendation(s): Normal biventricular function without evidence of hemodynamically significant valvular heart disease. FINDINGS  Left Ventricle: Left ventricular ejection fraction, by estimation, is 60 to 65%. The left ventricle has normal function. The left ventricle has no regional wall motion abnormalities. The left ventricular internal cavity size was normal in size. There is  no left ventricular hypertrophy. Left ventricular diastolic parameters were normal. Right Ventricle: The right ventricular size is normal. No increase in right ventricular wall thickness. Right ventricular systolic function is normal. Tricuspid regurgitation signal is inadequate for assessing PA pressure. Left Atrium: Left atrial size was normal in size. Right Atrium: Right atrial size was normal in size. Pericardium: There is no evidence of pericardial effusion. Presence of epicardial fat layer. Mitral Valve: The mitral valve is grossly normal. Trivial mitral valve regurgitation. No evidence of mitral valve stenosis. Tricuspid Valve: The tricuspid valve is grossly normal. Tricuspid valve regurgitation is trivial. No evidence of tricuspid stenosis. Aortic Valve: The aortic valve is tricuspid. Aortic valve regurgitation is not visualized. No aortic stenosis is present. Aortic valve peak gradient measures 10.2 mmHg. Pulmonic Valve: The pulmonic valve was grossly normal. Pulmonic valve regurgitation is not visualized. No evidence of pulmonic stenosis. Aorta: The  aortic root and ascending aorta are structurally normal, with no evidence of dilitation. Venous: The inferior vena cava is normal in size with greater than 50% respiratory variability, suggesting right atrial  pressure of 3 mmHg. IAS/Shunts: The atrial septum is grossly normal.  LEFT VENTRICLE PLAX 2D LVIDd:         4.30 cm   Diastology LVIDs:         2.90 cm   LV e' medial:    7.40 cm/s LV PW:         1.30 cm   LV E/e' medial:  7.6 LV IVS:        1.00 cm   LV e' lateral:   8.05 cm/s LVOT diam:     2.00 cm   LV E/e' lateral: 7.0 LV SV:         62 LV SV Index:   33 LVOT Area:     3.14 cm  RIGHT VENTRICLE RV Basal diam:  3.00 cm RV S prime:     10.60 cm/s TAPSE (M-mode): 2.0 cm LEFT ATRIUM             Index        RIGHT ATRIUM           Index LA diam:        2.50 cm 1.34 cm/m   RA Area:     12.00 cm LA Vol (A2C):   29.0 ml 15.56 ml/m  RA Volume:   29.40 ml  15.77 ml/m LA Vol (A4C):   15.8 ml 8.48 ml/m LA Biplane Vol: 22.5 ml 12.07 ml/m  AORTIC VALVE                 PULMONIC VALVE AV Area (Vmax): 2.24 cm     PV Vmax:        1.15 m/s AV Vmax:        160.00 cm/s  PV Peak grad:   5.3 mmHg AV Peak Grad:   10.2 mmHg    RVOT Peak grad: 4 mmHg LVOT Vmax:      114.00 cm/s LVOT Vmean:     74.400 cm/s LVOT VTI:       0.196 m  AORTA Ao Root diam: 3.00 cm Ao Asc diam:  2.60 cm MITRAL VALVE MV Area (PHT): 3.39 cm    SHUNTS MV Decel Time: 224 msec    Systemic VTI:  0.20 m MV E velocity: 56.10 cm/s  Systemic Diam: 2.00 cm MV A velocity: 68.70 cm/s MV E/A ratio:  0.82 Darryle Decent MD Electronically signed by Darryle Decent MD Signature Date/Time: 07/30/2023/11:07:32 AM    Final       Time coordinating discharge: over 30 minutes  SIGNED:  Aalia Greulich DO [facility]

## 2023-07-30 NOTE — Progress Notes (Signed)
 Rounding Note    Patient Name: Kathryn Ramirez Date of Encounter: 07/30/2023  [facility] Cardiologist: New-End  Subjective   LHC showed no significant CAD. Bps soft at times. Echo pending. Patient is overall doing well. She denies chest pain, SOB, nausea or vomiting.  Inpatient Medications    Scheduled Meds:  ARIPiprazole   10 mg Oral Daily   aspirin  EC  81 mg Oral Daily   buPROPion   150 mg Oral Daily   enoxaparin  (LOVENOX ) injection  40 mg Subcutaneous Q24H   mometasone -formoterol   2 puff Inhalation BID   oxybutynin   5 mg Oral Daily   pantoprazole   40 mg Oral QPM   rosuvastatin   40 mg Oral QHS   sodium chloride  flush  3 mL Intravenous Q12H   traZODone   200 mg Oral QHS   Continuous Infusions:  sodium chloride      PRN Meds: sodium chloride , acetaminophen , albuterol , nitroGLYCERIN , ondansetron  (ZOFRAN ) IV, sodium chloride  flush   Vital Signs    Vitals:   07/29/23 1816 07/29/23 2056 07/30/23 0023 07/30/23 0450  BP: 126/75 114/67 (!) 107/59 98/75  Pulse: 77 74 71 78  Resp:  16 18 18   Temp:  97.8 F (36.6 C) 97.8 F (36.6 C) 98.1 F (36.7 C)  TempSrc:  Oral    SpO2: 95% 96% 92% 93%  Weight:      Height:       No intake or output data in the 24 hours ending 07/30/23 0727    07/28/2023    1:36 PM 04/30/71    2:17 PM 03/18/71    8:07 AM  Last 3 Weights  Weight (lbs) 183 lb 6.4 oz 162 lb 162 lb  Weight (kg) 83.19 kg 73.483 kg 73.483 kg      Telemetry    Sinus rhythm heart 70s- Personally Reviewed  ECG    Normal sinus rhythm heart rate 73, no acute ischemic changes- Personally Reviewed  Physical Exam   GEN: No acute distress.   Neck: No JVD Cardiac: RRR, no murmurs, rubs, or gallops.  Respiratory: Clear to auscultation bilaterally. GI: Soft, nontender, non-distended  MS: No edema; No deformity. Neuro:  Nonfocal  Psych: Normal affect  Ext: Right radial cath site 2+ pulse, no hematoma  Labs    High Sensitivity Troponin:   Recent Labs   Lab 07/28/23 1149 07/28/23 1340 07/28/23 1802  TROPONINIHS 85* 87* 101*     Chemistry Recent Labs  Lab 07/28/23 1149 07/30/23 0428  NA 134* 139  K 4.1 4.4  CL 101 105  CO2 24 25  GLUCOSE 91 101*  BUN 17 14  CREATININE 0.72 0.71  CALCIUM  9.0 9.1  PROT 7.5  --   ALBUMIN 3.9  --   AST 18  --   ALT 17  --   ALKPHOS 44  --   BILITOT 0.8  --   GFRNONAA >60 >60  ANIONGAP 9 9    Lipids  Recent Labs  Lab 07/29/23 0439  CHOL 111  TRIG 115  HDL 51  LDLCALC 37  CHOLHDL 2.2    Hematology Recent Labs  Lab 07/28/23 1149 07/29/23 0439 07/30/23 0428  WBC 10.5 7.1 9.0  RBC 4.80 4.45 4.89  HGB 11.4* 10.7* 12.0  HCT 37.2 35.8* 38.5  MCV 77.5* 80.4 78.7*  MCH 23.8* 24.0* 24.5*  MCHC 30.6 29.9* 31.2  RDW 15.4 15.4 15.5  PLT 324 286 314   Thyroid  Recent Labs  Lab 07/28/23 1530  TSH 0.913  BNPNo results for input(s): BNP, PROBNP in the last 168 hours.  DDimer No results for input(s): DDIMER in the last 168 hours.   Radiology    CARDIAC CATHETERIZATION Result Date: 07/29/2023 Conclusions: No angiographically significant coronary artery disease. Grossly normal left ventricular systolic function, though contrast opacification of the left ventricle was suboptimal (LVEF 55-65%). Normal left ventricular filling pressure (LVEDP 9 mmHg). Recommendations: Continue conservative therapy of COVID-19 infection.  I suspect mild troponin elevation reflects supply-demand mismatch. Obtain echocardiogram to confirm normal LVEF in the setting of troponin elevation and COVID-19 infection. Primary prevention of coronary artery disease. Anticipate discharge home tomorrow (patient does not have anyone to stay with her tonight, precluding discharge this evening). Lonni Hanson, MD [facility]  DG Chest 2 View Result Date: 07/28/2023 CLINICAL DATA:  Chest pain and shortness of breath. EXAM: CHEST - 2 VIEW COMPARISON:  Chest radiographs 07/28/2022 FINDINGS: Cardiac silhouette and  mediastinal contours are within normal limits. Mild-to-moderate atherosclerotic calcifications within the aortic arch. Unchanged mild inferolateral subpleural linear scarring. No acute airspace opacity. No pleural effusion or pneumothorax. Moderate multilevel degenerative disc changes of the thoracic spine. Lower cervical spine ACDF hardware is again seen. IMPRESSION: No active cardiopulmonary disease. Electronically Signed   By: Tanda Lyons M.D.   On: 07/28/2023 14:29    Cardiac Studies   LHC 07/28/22 Conclusions: No angiographically significant coronary artery disease. Grossly normal left ventricular systolic function, though contrast opacification of the left ventricle was suboptimal (LVEF 55-65%). Normal left ventricular filling pressure (LVEDP 9 mmHg).   Recommendations: Continue conservative therapy of COVID-19 infection.  I suspect mild troponin elevation reflects supply-demand mismatch. Obtain echocardiogram to confirm normal LVEF in the setting of troponin elevation and COVID-19 infection. Primary prevention of coronary artery disease. Anticipate discharge home tomorrow (patient does not have anyone to stay with her tonight, precluding discharge this evening).   Lonni Hanson, MD [facility]    Echo 72/2024  Summary   1. The left ventricle is normal in size with normal wall thickness.    2. The left ventricular systolic function is normal, LVEF is visually  estimated at > 55%.    3. The right ventricle is normal in size, with normal systolic function.    4. There are no significant valvular abnormalities.    Myoview  lexiscan  72/2024  IMPRESSIONS:  __________________________________________________________________  -Normal myocardial perfusion study  - No evidence of any significant ischemia or scar  - Left ventricular systolic function is normal. Post stress the ejection fraction is > 65%. The left ventricle is normal in size.  -Unchanged 2.6 cm left breast mass  with apparent internal fat attenuation, which has been stable over multiple prior mammograms and may represent a hamartoma. Atherosclerotic calcifications of the thoracic aorta and coronary arteries.    Stress test    Stress ECG: Non-diagnostic ECG portion of pharmacologic stress test.   Nuclear images are reported separately.  Resting ECG  - ECG is normal.  - Baseline shows normal sinus rhythm   Stress Findings  - A pharmacological stress test was performed using regadenoson  0.4mg  IV The patient with a peak HR of 95 bpm (63% of MPHR)  - The patient reported no symptoms during the stress test. The patient reported no symptoms during recovery.  - Blood pressure demonstrated a normal response and heart rate demonstrated a normal response to stress. The patient's heart rate recovery was normal.   Stress ECG  - No significant ST segment changes were noted during stress or  recovery  - There were no arrhythmias during stress.  - There were no arrhythmias during recovery. Non-diagnostic ECG portion of pharmacologic stress test.   Patient Profile     72 year old female with history of diabetes, COPD, coronary calcium  admitted on 07/27/2022 for non-STEMI in the setting of COVID-19 infection.  Assessment & Plan    # Demand Ischemia -Admitted with nausea vomiting chest pain and shortness of breath in the setting of COVID-19 infection.  Troponins are minimally elevated and flat.  EKG unremarkable.  Left heart catheterization with normal coronary anatomy. -Echocardiogram is normal. -Strongly suspect this is demand ischemia in the setting of COVID-19 infection.  I do not have a strong suspicion for myocarditis.  Symptoms have resolved. -Continue aspirin  and statin. -We will arrange outpatient follow-up.  # COVID-19 infection -Supportive care  For questions or updates, please contact [facility] Please consult [email or web address] for contact info under   [employer] Barbaraann, MD, [facility]  [facility]  [address] [place], [place] [address] [phone]  11:13 AM

## 2023-07-30 NOTE — Plan of Care (Signed)
  Problem: Education: Goal: Understanding of cardiac disease, CV risk reduction, and recovery process will improve Outcome: Progressing Goal: Individualized Educational Video(s) Outcome: Progressing   Problem: Activity: Goal: Ability to tolerate increased activity will improve Outcome: Progressing   

## 2023-08-01 ENCOUNTER — Encounter: Payer: Self-pay | Admitting: Internal Medicine

## 2023-08-22 ENCOUNTER — Ambulatory Visit: Payer: 59 | Attending: Medical | Admitting: Medical

## 2023-08-22 ENCOUNTER — Encounter: Payer: Self-pay | Admitting: Medical

## 2023-08-22 VITALS — BP 100/63 | HR 77 | Ht 63.0 in | Wt 160.2 lb

## 2023-08-22 DIAGNOSIS — I2489 Other forms of acute ischemic heart disease: Secondary | ICD-10-CM

## 2023-08-22 DIAGNOSIS — I214 Non-ST elevation (NSTEMI) myocardial infarction: Secondary | ICD-10-CM

## 2023-08-22 DIAGNOSIS — J45902 Unspecified asthma with status asthmaticus: Secondary | ICD-10-CM

## 2023-08-22 NOTE — Patient Instructions (Signed)
Medication Instructions:  Your physician recommends that you continue on your current medications as directed. Please refer to the Current Medication list given to you today.   *If you need a refill on your cardiac medications before your next appointment, please call your pharmacy*   Lab Work: No labs ordered today    Testing/Procedures: No test ordered today    Follow-Up: At [facility], you and your health needs are our priority.  As part of our continuing mission to provide you with exceptional heart care, we have created designated Provider Care Teams.  These Care Teams include your primary Cardiologist (physician) and Advanced Practice Providers (APPs -  Physician Assistants and Nurse Practitioners) who all work together to provide you with the care you need, when you need it.  We recommend signing up for the patient portal called "MyChart".  Sign up information is provided on this After Visit Summary.  MyChart is used to connect with patients for Virtual Visits (Telemedicine).  Patients are able to view lab/test results, encounter notes, upcoming appointments, etc.  Non-urgent messages can be sent to your provider as well.   To learn more about what you can do with MyChart, go to [email or web address].    Your next appointment:   With your cardiologist at [facility]

## 2023-08-22 NOTE — Progress Notes (Signed)
Cardiology Office Note:    Date:  08/22/2023   ID:  Kathryn Ramirez, DOB 1951-06-29, MRN [id number]  PCP:  Pcp, No  [facility] Cardiologist: [facility] Electrophysiologist:  None   Referring MD: Care, [facility]   Chief Complaint: Hospital follow-up  History of Present Illness:    Kathryn Ramirez is a 73 y.o. female with a hx of diabetes, COPD, coronary calcium admitted on 07/27/2022 for non-STEMI in the setting of COVID 19 infection.  Patient was admitted in early January 2025 with chest pain, shortness of breath, nausea and vomiting in the setting of COVID-19 infection.  Troponins were minimally elevated and flat.  Left heart cath showed normal coronary anatomy.  Echo showed normal pump function.  Suspected demand ischemia in the setting of COVID-19 infection.  Symptoms improved and patient was discharged home.  Today, the patient reports she is better since the discharge. She still has chest tightness when she exerts herself .  She denies lower leg edema, orthopnea or pnd. She denies dizziness or lightheadedness. BP is soft, she is not on BP lowering drugs at baseline. Cath site is stable.   Past Medical History:  Diagnosis Date   Acid reflux disease    Anemia    Asthma    Bipolar 1 disorder (HCC)    COPD (chronic obstructive pulmonary disease) (HCC)    Diabetes mellitus without complication (HCC)    History of hepatitis C 2010   took interferon x 1 year   Hypertension    Liver hemangioma    Lung nodule    Psoriasis    Psoriatic arthritis (HCC)    Rheumatoid arthritis (HCC)    Tardive dyskinesia    Wears dentures    full upper and lower    Past Surgical History:  Procedure Laterality Date   CARPAL TUNNEL RELEASE Left 02/21/2020   Procedure: CARPAL TUNNEL RELEASE ENDOSCOPIC;  Surgeon: Christena Flake, MD;  Location: [facility] ORS;  Service: Orthopedics;  Laterality: Left;   CATARACT EXTRACTION W/ INTRAOCULAR LENS  IMPLANT, BILATERAL     CERVICAL SPINE SURGERY   2017   [facility]   CHOLECYSTECTOMY     COLONOSCOPY WITH PROPOFOL N/A 09/14/2016   Procedure: COLONOSCOPY WITH PROPOFOL;  Surgeon: Midge Minium, MD;  Location: [facility] ENDOSCOPY;  Service: Endoscopy;  Laterality: N/A;   ESOPHAGOGASTRODUODENOSCOPY (EGD) WITH PROPOFOL N/A 09/14/2016   Procedure: ESOPHAGOGASTRODUODENOSCOPY (EGD) WITH PROPOFOL;  Surgeon: Midge Minium, MD;  Location: [facility] ENDOSCOPY;  Service: Endoscopy;  Laterality: N/A;   FLEXIBLE BRONCHOSCOPY N/A 01/13/2016   Procedure: FLEXIBLE BRONCHOSCOPY;  Surgeon: Stephanie Acre, MD;  Location: [facility] ORS;  Service: Cardiopulmonary;  Laterality: N/A;   JOINT REPLACEMENT     LEFT HEART CATH AND CORONARY ANGIOGRAPHY N/A 07/29/2023   Procedure: LEFT HEART CATH AND CORONARY ANGIOGRAPHY;  Surgeon: Yvonne Kendall, MD;  Location: [facility] INVASIVE CV LAB;  Service: Cardiovascular;  Laterality: N/A;   NEUROPLASTY MAJOR NERVE     POLYPECTOMY     THROAT SURGERY     TOTAL KNEE ARTHROPLASTY Right 06/15/2017   Procedure: TOTAL KNEE ARTHROPLASTY;  Surgeon: Deeann Saint, MD;  Location: [facility] ORS;  Service: Orthopedics;  Laterality: Right;    Current Medications: Current Meds  Medication Sig   acetaminophen (TYLENOL) 500 MG tablet Take 500 mg by mouth 2 (two) times daily.   albuterol (VENTOLIN HFA) 108 (90 Base) MCG/ACT inhaler INHALE 2 PUFFS EVERY 4 HOURS AS NEEDED FOR WHEEZING FOR SHORTNESS OF BREATH   ascorbic acid (VITAMIN  C) 500 MG tablet Take 500 mg by mouth daily.   aspirin EC 81 MG tablet Take 1 tablet (81 mg total) by mouth daily. Swallow whole.   atorvastatin (LIPITOR) 40 MG tablet Take 1 tablet by mouth daily.   buPROPion (WELLBUTRIN XL) 150 MG 24 hr tablet Take 150 mg by mouth daily.   calcipotriene (DOVONOX) 0.005 % cream Apply 1 Application topically 2 (two) times daily.   Cholecalciferol (VITAMIN D-1000 MAX ST) 25 MCG (1000 UT) tablet Take 1 tablet by mouth daily.   cyanocobalamin (VITAMIN B12) 1000 MCG tablet Take 1,000 mcg by mouth  daily.   LORazepam (ATIVAN) 0.5 MG tablet Take 0.25 mg by mouth every morning.   oxybutynin (DITROPAN) 5 MG tablet Take 5 mg by mouth every morning.    OZEMPIC, 1 MG/DOSE, 4 MG/3ML SOPN INJECT 1 MG UNDER THE SKIN EVERY 7 DAYS   pyridoxine (B-6) 500 MG tablet Take by mouth.   SYMBICORT 160-4.5 MCG/ACT inhaler Inhale 2 puffs into the lungs 2 (two) times daily.   SYNJARDY XR 25-1000 MG TB24 Take 1 tablet by mouth daily.    traZODone (DESYREL) 100 MG tablet Take 200 mg by mouth at bedtime.   [DISCONTINUED] rosuvastatin (CRESTOR) 10 MG tablet Take 1 tablet (10 mg total) by mouth at bedtime.     Allergies:   Oxcarbazepine, Celecoxib, Haldol [haloperidol], Haloperidol decanoate, Haloperidol lactate, Meloxicam, Codeine, and Diphenhydramine   Social History   Socioeconomic History   Marital status: Single    Spouse name: Not on file   Number of children: Not on file   Years of education: Not on file   Highest education level: Not on file  Occupational History   Not on file  Tobacco Use   Smoking status: Former    Current packs/day: 0.00    Average packs/day: 1 pack/day for 30.0 years (30.0 ttl pk-yrs)    Types: E-cigarettes, Cigarettes    Start date: 09/18/1985    Quit date: 09/19/2015    Years since quitting: 7.9   Smokeless tobacco: Never  Vaping Use   Vaping status: Never Used  Substance and Sexual Activity   Alcohol use: Not Currently    Alcohol/week: 0.0 standard drinks of alcohol    Comment: 03/31/2012 sobierty    Drug use: No   Sexual activity: Never  Other Topics Concern   Not on file  Social History Narrative   Not on file   Social Drivers of Health   Financial Resource Strain: Low Risk  (02/18/2022)   Received [facility]   Overall Financial Resource Strain (CARDIA)    Difficulty of Paying Living Expenses: Not hard at all  Food Insecurity: No Food Insecurity (08/05/2023)   Received from [facility]   Hunger Vital Sign    Worried About  Running Out of Food in the Last Year: Never true    Ran Out of Food in the Last Year: Never true  Transportation Needs: No Transportation Needs (07/30/2023)   PRAPARE - Administrator, Civil Service (Medical): No    Lack of Transportation (Non-Medical): No  Physical Activity: Insufficiently Active (02/18/2022)   Received from [facility]   Exercise Vital Sign    Days of Exercise per Week: 2 days    Minutes of Exercise per Session: 20 min  Stress: No Stress Concern Present (08/05/2023)   Received from [facility] - Occupational  Stress Questionnaire    Feeling of Stress : Not at all  Social Connections: Unknown (07/30/2023)   Social Connection and Isolation Panel [NHANES]    Frequency of Communication with Friends and Family: Never    Frequency of Social Gatherings with Friends and Family: Never    Attends Religious Services: Never    Database administrator or Organizations: No    Attends Engineer, structural: Never    Marital Status: Patient unable to answer     Family History: The patient's family history includes Asthma in her brother; Cancer in her mother. There is no history of Breast cancer.  ROS:   Please see the history of present illness.     All other systems reviewed and are negative.  EKGs/Labs/Other Studies Reviewed:    The following studies were reviewed today:  Echo 07/2023 1. Left ventricular ejection fraction, by estimation, is 60 to 65%. The  left ventricle has normal function. The left ventricle has no regional  wall motion abnormalities. Left ventricular diastolic parameters were  normal.   2. Right ventricular systolic function is normal. The right ventricular  size is normal. Tricuspid regurgitation signal is inadequate for assessing  PA pressure.   3. The mitral valve is grossly normal. Trivial mitral valve  regurgitation. No evidence of mitral stenosis.   4. The aortic  valve is tricuspid. Aortic valve regurgitation is not  visualized. No aortic stenosis is present.   5. The inferior vena cava is normal in size with greater than 50%  respiratory variability, suggesting right atrial pressure of 3 mmHg.   Conclusion(s)/Recommendation(s): Normal biventricular function without  evidence of hemodynamically significant valvular heart disease.   LHC 07/2023 Conclusions: No angiographically significant coronary artery disease. Grossly normal left ventricular systolic function, though contrast opacification of the left ventricle was suboptimal (LVEF 55-65%). Normal left ventricular filling pressure (LVEDP 9 mmHg).   Recommendations: Continue conservative therapy of COVID-19 infection.  I suspect mild troponin elevation reflects supply-demand mismatch. Obtain echocardiogram to confirm normal LVEF in the setting of troponin elevation and COVID-19 infection. Primary prevention of coronary artery disease. Anticipate discharge home tomorrow (patient does not have anyone to stay with her tonight, precluding discharge this evening).   Yvonne Kendall, MD [facility]  EKG:  EKG is ordered today.  The ekg ordered today demonstrates NSR 77bpm, LAD, nonspecific T wave changes  Recent Labs: 07/28/2023: ALT 17; TSH 0.913 07/30/2023: BUN 14; Creatinine, Ser 0.71; Hemoglobin 12.0; Platelets 314; Potassium 4.4; Sodium 139  Recent Lipid Panel    Component Value Date/Time   CHOL 111 07/29/2023 0439   TRIG 115 07/29/2023 0439   HDL 51 07/29/2023 0439   CHOLHDL 2.2 07/29/2023 0439   VLDL 23 07/29/2023 0439   LDLCALC 37 07/29/2023 0439   LDLCALC 59 05/02/2017 1115    Physical Exam:    VS:  BP 100/63 (BP Location: Left Arm, Patient Position: Sitting)   Pulse 77   Ht 5\' 3"  (1.6 m)   Wt 160 lb 3.2 oz (72.7 kg)   SpO2 97%   BMI 28.38 kg/m     Wt Readings from Last 3 Encounters:  08/22/23 160 lb 3.2 oz (72.7 kg)  07/28/23 183 lb 6.4 oz (83.2 kg)  04/30/23 162 lb  (73.5 kg)     GEN:  Well nourished, well developed in no acute distress HEENT: Normal NECK: No JVD; No carotid bruits LYMPHATICS: No lymphadenopathy CARDIAC: RRR, no murmurs, rubs, gallops RESPIRATORY:  Clear to auscultation without  rales, wheezing or rhonchi  ABDOMEN: Soft, non-tender, non-distended MUSCULOSKELETAL:  No edema; No deformity  SKIN: Warm and dry NEUROLOGIC:  Alert and oriented x 3 PSYCHIATRIC:  Normal affect   ASSESSMENT:    1. NSTEMI (non-ST elevated myocardial infarction) (HCC)   2. Demand ischemia (HCC)   3. Asthma with status asthmaticus, unspecified asthma severity, unspecified whether persistent    PLAN:    In order of problems listed above:  Demand ischemia/NSTEMI Recent admission for nausea, vomiting, chest pain and SOB in the setting of COVID-19 infection. Troponins were minimally elevated (101) with flat trend. Echo showed normal LVEF with normal diastolic parameters. LHC showed no significant CAD.  Cath site healed well. Patient reports persistent chest tightness and DOE that I suspect is from lungs/asthma. Continue ASA and Crestor. She will continue to follow up with [facility] cardiology.   Asthma/DOE H/o COVID-19 infection Patient is following with pulmonology, plan for Chest CT next month.  Disposition: Follow up with [facility]  Signed, Mirtie Bastyr David Stall, PA-C  08/22/2023 12:09 PM    [facility]

## 2023-10-12 ENCOUNTER — Other Ambulatory Visit: Payer: Self-pay | Admitting: Neurology

## 2023-10-12 DIAGNOSIS — R413 Other amnesia: Secondary | ICD-10-CM

## 2023-10-17 ENCOUNTER — Ambulatory Visit
Admission: RE | Admit: 2023-10-17 | Discharge: 2023-10-17 | Disposition: A | Discharge status: Home / Self Care | Source: Ambulatory Visit | Attending: Neurology | Admitting: Neurology

## 2023-10-17 DIAGNOSIS — R413 Other amnesia: Secondary | ICD-10-CM | POA: Insufficient documentation

## 2023-12-01 ENCOUNTER — Ambulatory Visit
Admission: EM | Admit: 2023-12-01 | Discharge: 2023-12-01 | Disposition: A | Discharge status: Home / Self Care | Attending: Emergency Medicine | Admitting: Emergency Medicine

## 2023-12-01 ENCOUNTER — Ambulatory Visit (INDEPENDENT_AMBULATORY_CARE_PROVIDER_SITE_OTHER)

## 2023-12-01 DIAGNOSIS — R051 Acute cough: Secondary | ICD-10-CM | POA: Diagnosis not present

## 2023-12-01 DIAGNOSIS — J441 Chronic obstructive pulmonary disease with (acute) exacerbation: Secondary | ICD-10-CM | POA: Diagnosis present

## 2023-12-01 LAB — SARS CORONAVIRUS 2 BY RT PCR: SARS Coronavirus 2 by RT PCR: NEGATIVE

## 2023-12-01 LAB — GROUP A STREP BY PCR: Group A Strep by PCR: NOT DETECTED

## 2023-12-01 MED ORDER — BENZONATATE 100 MG PO CAPS: 200.0000 mg | ORAL_CAPSULE | Freq: Three times a day (TID) | ORAL | 0 refills | Status: DC

## 2023-12-01 MED ORDER — LEVOFLOXACIN 500 MG PO TABS: 500.0000 mg | ORAL_TABLET | Freq: Every day | ORAL | 0 refills | Status: AC

## 2023-12-01 MED ORDER — PREDNISONE 20 MG PO TABS: 60.0000 mg | ORAL_TABLET | Freq: Every day | ORAL | 0 refills | Status: AC

## 2023-12-01 MED ORDER — PROMETHAZINE-DM 6.25-15 MG/5ML PO SYRP: 5.0000 mL | ORAL_SOLUTION | Freq: Four times a day (QID) | ORAL | 0 refills | Status: DC | PRN

## 2023-12-01 NOTE — ED Provider Notes (Addendum)
 [facility]    CSN: [id number] Arrival date & time: 12/01/23  0807      History   Chief Complaint Chief Complaint  Patient presents with   Cough   Asthma    HPI Kathryn Ramirez is a 73 y.o. female.   HPI  73 year old female with past medical history significant for asthma, COPD, bipolar type I, GERD, diabetes, hypertension, psoriatic arthritis, psoriasis, rheumatoid arthritis, and tardive dyskinesia presents for evaluation of productive cough for green sputum, shortness of breath, and sore throat.  She reports that she has not had any wheezing.  She has been taking her Symbicort twice daily and using her albuterol  inhaler but reports that it is not improving her respiratory symptoms.  She feels that she has a hard time breathing.  She denies fever, runny nose, nasal congestion, or ear pain.  Past Medical History:  Diagnosis Date   Acid reflux disease    Anemia    Asthma    Bipolar 1 disorder (HCC)    COPD (chronic obstructive pulmonary disease) (HCC)    Diabetes mellitus without complication (HCC)    History of hepatitis C 2010   took interferon x 1 year   Hypertension    Liver hemangioma    Lung nodule    Psoriasis    Psoriatic arthritis (HCC)    Rheumatoid arthritis (HCC)    Tardive dyskinesia    Wears dentures    full upper and lower    Patient Active Problem List   Diagnosis Date Noted   NSTEMI (non-ST elevated myocardial infarction) (HCC) 07/28/2023   S/P cervical spinal fusion (C5/6 and C6/7) 04/08/2020   Neuroforaminal stenosis of cervical spine (moderate, Left C3/4, C4/5; R C4/5) 04/08/2020   Pain in limb 01/27/2018   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   Erroneous encounter - disregard 03/29/2017   Heartburn    Gastritis without bleeding    Hx of colonic polyps    Arthritis 09/02/2016   Hep C w/o coma, chronic (HCC) 09/02/2016   Hyperlipidemia, unspecified 09/02/2016   Hypertension 09/02/2016   Tardive dyskinesia 09/02/2016    Urinary incontinence 09/02/2016   History of hypothyroidism 08/19/2016   Neck swelling 08/19/2016   Acute asthma exacerbation 08/19/2016   GERD (gastroesophageal reflux disease) 08/09/2016   Dry mouth 06/01/2016   Elevated antinuclear antibody (ANA) level 06/01/2016   Generalized osteoarthritis of hand 06/01/2016   Degenerative joint disease of right knee 06/01/2016   Vocal cord polyp 05/17/2016   Chest pain 04/06/2016   Drug-induced peripheral neuropathy (HCC) 03/25/2016   Sensory ataxia 03/25/2016   Wrist pain, chronic, left 03/25/2016   Impairment of balance 02/05/2016   Acute right-sided low back pain without sciatica 02/05/2016   Normal blood pressure 01/23/2016   Pulmonary infiltrates    Chronic cough    Pulmonary scarring 01/06/2016   OAB (overactive bladder) 12/18/2015   Asthma 09/18/2015   Multiple pulmonary nodules 09/15/2015   Baker's cyst of knee, right 07/02/2015   Bipolar disorder with moderate depression (HCC) 12/19/2014   Chronic pain 12/19/2014   Carpal tunnel syndrome of left wrist 10/01/2014   Chronic RUQ pain 09/19/2014   Esophageal dysphagia 09/19/2014   Hemangioma of liver 09/19/2014   Arthropathy of cervical facet joint 08/06/2014   Cervical facet joint syndrome 07/12/2014   Cervical myelopathy with cervical radiculopathy (HCC) 03/24/2014   Hyponatremia 02/21/2014   Cervicalgia 09/18/2013   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 09/18/2013    Past Surgical History:  Procedure Laterality Date   CARPAL TUNNEL RELEASE Left 02/21/2020   Procedure: CARPAL TUNNEL RELEASE ENDOSCOPIC;  Surgeon: Elner Hahn, MD;  Location: [facility];  Service: Orthopedics;  Laterality: Left;   CATARACT EXTRACTION W/ INTRAOCULAR LENS  IMPLANT, BILATERAL     CERVICAL SPINE SURGERY  2017   [facility]   CHOLECYSTECTOMY     COLONOSCOPY WITH PROPOFOL  N/A 09/14/2016   Procedure: COLONOSCOPY WITH PROPOFOL ;  Surgeon: Marnee Sink, MD;   Location: [facility];  Service: Endoscopy;  Laterality: N/A;   ESOPHAGOGASTRODUODENOSCOPY (EGD) WITH PROPOFOL  N/A 09/14/2016   Procedure: ESOPHAGOGASTRODUODENOSCOPY (EGD) WITH PROPOFOL ;  Surgeon: Marnee Sink, MD;  Location: [facility];  Service: Endoscopy;  Laterality: N/A;   FLEXIBLE BRONCHOSCOPY N/A 01/13/2016   Procedure: FLEXIBLE BRONCHOSCOPY;  Surgeon: Laine Piggs, MD;  Location: [facility];  Service: Cardiopulmonary;  Laterality: N/A;   JOINT REPLACEMENT     LEFT HEART CATH AND CORONARY ANGIOGRAPHY N/A 07/29/2023   Procedure: LEFT HEART CATH AND CORONARY ANGIOGRAPHY;  Surgeon: Sammy Crisp, MD;  Location: [facility];  Service: Cardiovascular;  Laterality: N/A;   NEUROPLASTY MAJOR NERVE     POLYPECTOMY     THROAT SURGERY     TOTAL KNEE ARTHROPLASTY Right 06/15/2017   Procedure: TOTAL KNEE ARTHROPLASTY;  Surgeon: Marlynn Singer, MD;  Location: [facility];  Service: Orthopedics;  Laterality: Right;    OB History   No obstetric history on file.      Home Medications    Prior to Admission medications   Medication Sig Start Date End Date Taking? Authorizing Provider  acetaminophen  (TYLENOL ) 500 MG tablet Take 500 mg by mouth 2 (two) times daily.   Yes [provider]  albuterol  (VENTOLIN  HFA) 108 (90 Base) MCG/ACT inhaler INHALE 2 PUFFS EVERY 4 HOURS AS NEEDED FOR WHEEZING FOR SHORTNESS OF BREATH 03/26/20  Yes Marc Senior, MD  ascorbic acid (VITAMIN C) 500 MG tablet Take 500 mg by mouth daily.   Yes [provider]  aspirin  EC 81 MG tablet Take 1 tablet (81 mg total) by mouth daily. Swallow whole. 07/31/23  Yes Alexander, Natalie, DO  atorvastatin (LIPITOR) 40 MG tablet Take 1 tablet by mouth daily. 08/05/23 08/04/24 Yes [provider]  benzonatate  (TESSALON ) 100 MG capsule Take 2 capsules (200 mg total) by mouth every 8 (eight) hours. 12/01/23  Yes Kent Pear, NP  buPROPion  (WELLBUTRIN  XL) 150 MG 24 hr tablet Take 150 mg by mouth daily.    Yes [provider]  calcipotriene  (DOVONOX) 0.005 % cream Apply 1 Application topically 2 (two) times daily.   Yes [provider]  Cholecalciferol (VITAMIN D -1000 MAX ST) 25 MCG (1000 UT) tablet Take 1 tablet by mouth daily.   Yes [provider]  cyanocobalamin (VITAMIN B12) 1000 MCG tablet Take 1,000 mcg by mouth daily.   Yes [provider]  levofloxacin  (LEVAQUIN ) 500 MG tablet Take 1 tablet (500 mg total) by mouth daily. 12/01/23  Yes Kent Pear, NP  LORazepam (ATIVAN) 0.5 MG tablet Take 0.25 mg by mouth every morning. 07/11/23  Yes [provider]  oxybutynin  (DITROPAN ) 5 MG tablet Take 5 mg by mouth every morning.    Yes [provider]  OZEMPIC, 1 MG/DOSE, 4 MG/3ML SOPN INJECT 1 MG UNDER THE SKIN EVERY 7 DAYS 03/24/22  Yes [provider]  predniSONE  (DELTASONE ) 20 MG tablet Take 3 tablets (60 mg total) by mouth daily with breakfast for 5 days. 3 tablets daily for 5 days.  12/01/23 12/06/23 Yes Kent Pear, NP  promethazine -dextromethorphan (PROMETHAZINE -DM) 6.25-15 MG/5ML syrup Take 5 mLs by mouth 4 (four) times daily as needed. 12/01/23  Yes Kent Pear, NP  pyridoxine (B-6) 500 MG tablet Take by mouth. 08/18/23 08/17/24 Yes [provider]  SYMBICORT 160-4.5 MCG/ACT inhaler Inhale 2 puffs into the lungs 2 (two) times daily. 03/26/21  Yes [provider]  SYNJARDY  XR 25-1000 MG TB24 Take 1 tablet by mouth daily.  08/30/19  Yes [provider]  traZODone  (DESYREL ) 100 MG tablet Take 200 mg by mouth at bedtime.   Yes [provider]  pantoprazole  (PROTONIX ) 40 MG tablet TAKE 1 TABLET (40 MG TOTAL) BY MOUTH EVERY EVENING. 02/21/20 07/28/23  Marc Senior, MD    Family History Family History  Problem Relation Age of Onset   Cancer Mother        Esophageal Ca   Asthma Brother    Breast cancer Neg Hx     Social History Social History   Tobacco Use   Smoking status: Former    Current  packs/day: 0.00    Average packs/day: 1 pack/day for 30.0 years (30.0 ttl pk-yrs)    Types: E-cigarettes, Cigarettes    Start date: 09/18/1985    Quit date: 09/19/2015    Years since quitting: 8.2   Smokeless tobacco: Never  Vaping Use   Vaping status: Never Used  Substance Use Topics   Alcohol use: Not Currently    Alcohol/week: 0.0 standard drinks of alcohol    Comment: 03/31/2012 sobierty    Drug use: No     Allergies   Oxcarbazepine, Celecoxib , Haldol [haloperidol], Haloperidol decanoate, Haloperidol lactate, Meloxicam , Codeine, and Diphenhydramine    Review of Systems Review of Systems  Constitutional:  Negative for fever.  HENT:  Positive for sore throat. Negative for congestion, ear pain and rhinorrhea.   Respiratory:  Positive for cough and shortness of breath. Negative for wheezing.      Physical Exam Triage Vital Signs ED Triage Vitals  Encounter Vitals Group     BP      Systolic BP Percentile      Diastolic BP Percentile      Pulse      Resp      Temp      Temp src      SpO2      Weight      Height      Head Circumference      Peak Flow      Pain Score      Pain Loc      Pain Education      Exclude from Growth Chart    No data found.  Updated Vital Signs BP 130/79 (BP Location: Left Arm)   Pulse 88   Temp 98.6 F (37 C) (Oral)   Ht 5\' 3"  (1.6 m)   Wt 160 lb (72.6 kg)   SpO2 93%   BMI 28.34 kg/m   Visual Acuity Right Eye Distance:   Left Eye Distance:   Bilateral Distance:    Right Eye Near:   Left Eye Near:    Bilateral Near:     Physical Exam Vitals and nursing note reviewed.  Constitutional:      Appearance: Normal appearance. She is not ill-appearing.  HENT:     Head: Normocephalic and atraumatic.  Cardiovascular:     Rate and Rhythm: Normal rate and regular rhythm.     Pulses: Normal pulses.     Heart  sounds: Normal heart sounds. No murmur heard.    No friction rub. No gallop.  Pulmonary:     Effort: Pulmonary effort is  normal.     Breath sounds: Normal breath sounds. No wheezing, rhonchi or rales.  Skin:    General: Skin is warm and dry.     Capillary Refill: Capillary refill takes less than 2 seconds.     Findings: No rash.  Neurological:     General: No focal deficit present.     Mental Status: She is alert and oriented to person, place, and time.      UC Treatments / Results  Labs (all labs ordered are listed, but only abnormal results are displayed) Labs Reviewed  GROUP A STREP BY PCR  SARS CORONAVIRUS 2 BY RT PCR    EKG   Radiology No results found.  Procedures Procedures (including critical care time)  Medications Ordered in UC Medications - No data to display  Initial Impression / Assessment and Plan / UC Course  I have reviewed the triage vital signs and the nursing notes.  Pertinent labs & imaging results that were available during my care of the patient were reviewed by me and considered in my medical decision making (see chart for details).   Patient is a very pleasant, nontoxic-appearing 73 year old female presenting for evaluation of 4 days with respiratory symptoms as outlined HPI above with exception of a sore throat she denies any URI symptoms though she did report at triage dizziness.  However, during my history taking she did not mention any dizziness.  Her most prominent complaint was that she feels like she is having trouble breathing.  She is able to speak in full sentence without dyspnea or tachypnea but her respiratory rate at triage is 93%.  She has been using her Symbicort twice daily and her albuterol  inhaler without any improvement of symptoms.  She does endorse a productive cough for green sputum but no wheezing.  Cardiopulmonary exam reveals S1-S2 heart sounds with regular rate and rhythm and lung sounds that are clear to auscultation in all fields.  Due to the fact that she is complaining of a sore throat I will order a strep PCR.  I will also order a COVID test  given that she is on day 4 of symptoms.  Additionally, I will obtain a chest x-ray to evaluate for any acute cardiopulmonary pathology.  COVID PCR is negative.    Strep PCR is negative.  Chest x-ray independently reviewed and evaluated by me.  Impression: There is a blunting of the left costophrenic angle the lung fields are otherwise well aerated.  Cardiomediastinal silhouette appears normal.  No evidence of infiltrate or effusion. This chest x-ray was compared to chest x-ray dated 07/28/2023 with a similar appearance though improvement in the left lung base.  At that time radiology impression states mild inferolateral subpleural linear scarring. Radiology impression states no active cardiopulmonary disease.  I will discharge patient home with a diagnosis of COPD exacerbation start her on Levaquin  500 milligrams once daily for 7 days.  I will also prescribe a 5-day burst dose of 60 mg prednisone  to decrease pulmonary inflammation.  She should continue to use her albuterol  inhaler as well as her Symbicort.  Additionally, I will prescribe Tessalon  Perles and Promethazine  DM cough syrup With cough.   Final Clinical Impressions(s) / UC Diagnoses   Final diagnoses:  Acute cough  COPD exacerbation [facility])     Discharge Instructions  Your chest x-ray did not show any evidence of pneumonia.  However, since you are coughing up a purulent sputum I will treat you for a COPD exacerbation and treat you with a short course of antibiotics.  Take the 500 mg of Levaquin  once daily for 7 days for treatment of your COPD exacerbation.  Continue to use your albuterol  inhaler, 1 to 2 puffs every 4-6 hours as needed for shortness of breath or wheezing.  You may increase your Symbicort to 2 puffs 3 times a day for the next week to help aid in decreasing pulmonary inflammation and see if this improves your breathing.  Additionally, will prescribe you prednisone  5-day burst dose of 60 mg.  Take it each  morning at breakfast time for the next 5 days to decrease pulm inflammation and improve your breathing.  Use the Tessalon  Perles every 8 hours during the day as needed for cough.  Take them with a small sip of water.  They may give you numbness to the base of your tongue, or metallic taste in your mouth, this is normal.  Use the Promethazine  DM cough syrup at bedtime as needed for cough and congestion.  If your symptoms or not improving, or new symptoms develop, either return for reevaluation or follow-up with your primary care provider.  If your symptoms do not improve you may want to talk to your primary care provider about a referral to pulmonology.   ED Prescriptions     Medication Sig Dispense Auth. Provider   levofloxacin  (LEVAQUIN ) 500 MG tablet Take 1 tablet (500 mg total) by mouth daily. 7 tablet Kent Pear, NP   predniSONE  (DELTASONE ) 20 MG tablet Take 3 tablets (60 mg total) by mouth daily with breakfast for 5 days. 3 tablets daily for 5 days. 15 tablet Kent Pear, NP   benzonatate  (TESSALON ) 100 MG capsule Take 2 capsules (200 mg total) by mouth every 8 (eight) hours. 21 capsule Kent Pear, NP   promethazine -dextromethorphan (PROMETHAZINE -DM) 6.25-15 MG/5ML syrup Take 5 mLs by mouth 4 (four) times daily as needed. 118 mL Kent Pear, NP      PDMP not reviewed this encounter.   Kent Pear, NP 12/01/23 1610    Kent Pear, NP 12/01/23 1017

## 2023-12-01 NOTE — Discharge Instructions (Addendum)
 Your chest x-ray did not show any evidence of pneumonia.  However, since you are coughing up a purulent sputum I will treat you for a COPD exacerbation and treat you with a short course of antibiotics.  Take the 500 mg of Levaquin  once daily for 7 days for treatment of your COPD exacerbation.  Continue to use your albuterol  inhaler, 1 to 2 puffs every 4-6 hours as needed for shortness of breath or wheezing.  You may increase your Symbicort to 2 puffs 3 times a day for the next week to help aid in decreasing pulmonary inflammation and see if this improves your breathing.  Additionally, will prescribe you prednisone  5-day burst dose of 60 mg.  Take it each morning at breakfast time for the next 5 days to decrease pulm inflammation and improve your breathing.  Use the Tessalon  Perles every 8 hours during the day as needed for cough.  Take them with a small sip of water.  They may give you numbness to the base of your tongue, or metallic taste in your mouth, this is normal.  Use the Promethazine  DM cough syrup at bedtime as needed for cough and congestion.  If your symptoms or not improving, or new symptoms develop, either return for reevaluation or follow-up with your primary care provider.  If your symptoms do not improve you may want to talk to your primary care provider about a referral to pulmonology.

## 2023-12-01 NOTE — ED Triage Notes (Signed)
 Pt c/o asthma, dizziness, cough x4days  Pt states that she is taking dayquil and nyquil and it is not helping.   Pt states that she is having a productive cough.

## 2024-01-05 ENCOUNTER — Ambulatory Visit
Admission: EM | Admit: 2024-01-05 | Discharge: 2024-01-05 | Disposition: A | Discharge status: Home / Self Care | Attending: Physician Assistant | Admitting: Physician Assistant

## 2024-01-05 DIAGNOSIS — R110 Nausea: Secondary | ICD-10-CM | POA: Insufficient documentation

## 2024-01-05 DIAGNOSIS — R4189 Other symptoms and signs involving cognitive functions and awareness: Secondary | ICD-10-CM | POA: Insufficient documentation

## 2024-01-05 DIAGNOSIS — R202 Paresthesia of skin: Secondary | ICD-10-CM

## 2024-01-05 DIAGNOSIS — R42 Dizziness and giddiness: Secondary | ICD-10-CM

## 2024-01-05 DIAGNOSIS — I252 Old myocardial infarction: Secondary | ICD-10-CM | POA: Insufficient documentation

## 2024-01-05 LAB — URINALYSIS, W/ REFLEX TO CULTURE (INFECTION SUSPECTED)
Bacteria, UA: NONE SEEN
Bilirubin Urine: NEGATIVE
Glucose, UA: 500 mg/dL — AB
Hgb urine dipstick: NEGATIVE
Ketones, ur: NEGATIVE mg/dL
Leukocytes,Ua: NEGATIVE
Nitrite: NEGATIVE
Protein, ur: NEGATIVE mg/dL
RBC / HPF: NONE SEEN RBC/hpf (ref 0–5)
Specific Gravity, Urine: 1.01 (ref 1.005–1.030)
WBC, UA: NONE SEEN WBC/hpf (ref 0–5)
pH: 6 (ref 5.0–8.0)

## 2024-01-05 LAB — GLUCOSE, CAPILLARY: Glucose-Capillary: 90 mg/dL (ref 70–99)

## 2024-01-05 NOTE — ED Triage Notes (Signed)
 Pt c/o nausea and dizziness x1day  Pt states that she has had covid 2 times this year.  Pt states that the last time she had a heart attack on 07/28/23, she had nausea, dizziness, and vomiting.

## 2024-01-05 NOTE — Discharge Instructions (Addendum)
-  You need to have stroke and heart attack evaluation. I recommended going to ER by EMS but you declined and stated you will have friend take you to ER  You have been advised to follow up immediately in the emergency department for concerning signs.symptoms. If you declined EMS transport, please have a family member take you directly to the ED at this time. Do not delay. Based on concerns about condition, if you do not follow up in th e ED, you may risk poor outcomes including worsening of condition, delayed treatment and potentially life threatening issues. If you have declined to go to the ED at this time, you should call your PCP immediately to set up a follow up appointment.  Go to ED for red flag symptoms, including; fevers you cannot reduce with Tylenol /Motrin, severe headaches, vision changes, numbness/weakness in part of the body, lethargy, confusion, intractable vomiting, severe dehydration, chest pain, breathing difficulty, severe persistent abdominal or pelvic pain, signs of severe infection (increased redness, swelling of an area), feeling faint or passing out, dizziness, etc. You should especially go to the ED for sudden acute worsening of condition if you do not elect to go at this time.

## 2024-01-05 NOTE — ED Provider Notes (Signed)
 [facility]    CSN: [id number] Arrival date & time: 01/05/24  1044      History   Chief Complaint Chief Complaint  Patient presents with   Dizziness   Nausea    HPI Kathryn Ramirez is a 73 y.o. female with past medical history significant for NSTEMI, asthma, COPD, bipolar type I, GERD, diabetes, hypertension, psoriatic arthritis, psoriasis, rheumatoid arthritis, and tardive dyskinesia presents for dizziness and nausea that started 4 hours ago today. Patient reports having similar symptoms in January this year when she was found to have an NSTEMI.  However at this time she is not having any shortness of breath which she had last time.  She denies having chest pain, palpitations, leg swelling, vomiting, headaches, vision changes, confusion, numbness/tingling, abdominal pain, diarrhea.  Denies cough, congestion, sore throat, fever, ear pain, sinus pain.  She has had urinary frequency and urinary odor but denies painful urination. No flank pain or hematuria.  She reports tingling of her chin and left hand and does not know when that started. Patient took Zofran  and reports that her symptoms have improved a bit from onset.  HPI  Past Medical History:  Diagnosis Date   Acid reflux disease    Anemia    Asthma    Bipolar 1 disorder (HCC)    COPD (chronic obstructive pulmonary disease) (HCC)    Diabetes mellitus without complication (HCC)    History of hepatitis C 2010   took interferon x 1 year   Hypertension    Liver hemangioma    Lung nodule    Psoriasis    Psoriatic arthritis (HCC)    Rheumatoid arthritis (HCC)    Tardive dyskinesia    Wears dentures    full upper and lower    Patient Active Problem List   Diagnosis Date Noted   NSTEMI (non-ST elevated myocardial infarction) (HCC) 07/28/2023   S/P cervical spinal fusion (C5/6 and C6/7) 04/08/2020   Neuroforaminal stenosis of cervical spine (moderate, Left C3/4, C4/5; R C4/5) 04/08/2020   Pain in limb 01/27/2018    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   Erroneous encounter - disregard 03/29/2017   Heartburn    Gastritis without bleeding    Hx of colonic polyps    Arthritis 09/02/2016   Hep C w/o coma, chronic (HCC) 09/02/2016   Hyperlipidemia, unspecified 09/02/2016   Hypertension 09/02/2016   Tardive dyskinesia 09/02/2016   Urinary incontinence 09/02/2016   History of hypothyroidism 08/19/2016   Neck swelling 08/19/2016   Acute asthma exacerbation 08/19/2016   GERD (gastroesophageal reflux disease) 08/09/2016   Dry mouth 06/01/2016   Elevated antinuclear antibody (ANA) level 06/01/2016   Generalized osteoarthritis of hand 06/01/2016   Degenerative joint disease of right knee 06/01/2016   Vocal cord polyp 05/17/2016   Chest pain 04/06/2016   Drug-induced peripheral neuropathy (HCC) 03/25/2016   Sensory ataxia 03/25/2016   Wrist pain, chronic, left 03/25/2016   Impairment of balance 02/05/2016   Acute right-sided low back pain without sciatica 02/05/2016   Normal blood pressure 01/23/2016   Pulmonary infiltrates    Chronic cough    Pulmonary scarring 01/06/2016   OAB (overactive bladder) 12/18/2015   Asthma 09/18/2015   Multiple pulmonary nodules 09/15/2015   Baker's cyst of knee, right 07/02/2015   Bipolar disorder with moderate depression (HCC) 12/19/2014   Chronic pain 12/19/2014   Carpal tunnel syndrome of left wrist 10/01/2014   Chronic RUQ pain 09/19/2014   Esophageal dysphagia 09/19/2014   Hemangioma of  liver 09/19/2014   Arthropathy of cervical facet joint 08/06/2014   Cervical facet joint syndrome 07/12/2014   Cervical myelopathy with cervical radiculopathy (HCC) 03/24/2014   Hyponatremia 02/21/2014   Cervicalgia 09/18/2013   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 09/18/2013    Past Surgical History:  Procedure Laterality Date   CARPAL TUNNEL RELEASE Left 02/21/2020   Procedure: CARPAL TUNNEL RELEASE ENDOSCOPIC;  Surgeon: Elner Hahn, MD;  Location: [facility];  Service: Orthopedics;  Laterality: Left;   CATARACT EXTRACTION W/ INTRAOCULAR LENS  IMPLANT, BILATERAL     CERVICAL SPINE SURGERY  2017   [facility]   CHOLECYSTECTOMY     COLONOSCOPY WITH PROPOFOL  N/A 09/14/2016   Procedure: COLONOSCOPY WITH PROPOFOL ;  Surgeon: Marnee Sink, MD;  Location: [facility];  Service: Endoscopy;  Laterality: N/A;   ESOPHAGOGASTRODUODENOSCOPY (EGD) WITH PROPOFOL  N/A 09/14/2016   Procedure: ESOPHAGOGASTRODUODENOSCOPY (EGD) WITH PROPOFOL ;  Surgeon: Marnee Sink, MD;  Location: [facility];  Service: Endoscopy;  Laterality: N/A;   FLEXIBLE BRONCHOSCOPY N/A 01/13/2016   Procedure: FLEXIBLE BRONCHOSCOPY;  Surgeon: Laine Piggs, MD;  Location: [facility];  Service: Cardiopulmonary;  Laterality: N/A;   JOINT REPLACEMENT     LEFT HEART CATH AND CORONARY ANGIOGRAPHY N/A 07/29/2023   Procedure: LEFT HEART CATH AND CORONARY ANGIOGRAPHY;  Surgeon: Sammy Crisp, MD;  Location: [facility];  Service: Cardiovascular;  Laterality: N/A;   NEUROPLASTY MAJOR NERVE     POLYPECTOMY     THROAT SURGERY     TOTAL KNEE ARTHROPLASTY Right 06/15/2017   Procedure: TOTAL KNEE ARTHROPLASTY;  Surgeon: Marlynn Singer, MD;  Location: [facility];  Service: Orthopedics;  Laterality: Right;    OB History   No obstetric history on file.      Home Medications    Prior to Admission medications   Medication Sig Start Date End Date Taking? Authorizing Provider  acetaminophen  (TYLENOL ) 500 MG tablet Take 500 mg by mouth 2 (two) times daily.   Yes [provider]  albuterol  (VENTOLIN  HFA) 108 (90 Base) MCG/ACT inhaler INHALE 2 PUFFS EVERY 4 HOURS AS NEEDED FOR WHEEZING FOR SHORTNESS OF BREATH 03/26/20  Yes Marc Senior, MD  ascorbic acid (VITAMIN C) 500 MG tablet Take 500 mg by mouth daily.   Yes [provider]  aspirin  EC 81 MG tablet Take 1 tablet (81 mg total) by mouth daily. Swallow whole. 07/31/23  Yes Alexander,  Natalie, DO  atorvastatin (LIPITOR) 40 MG tablet Take 1 tablet by mouth daily. 08/05/23 08/04/24 Yes [provider]  buPROPion  (WELLBUTRIN  XL) 150 MG 24 hr tablet Take 150 mg by mouth daily.   Yes [provider]  calcipotriene  (DOVONOX) 0.005 % cream Apply 1 Application topically 2 (two) times daily.   Yes [provider]  Cholecalciferol (VITAMIN D -1000 MAX ST) 25 MCG (1000 UT) tablet Take 1 tablet by mouth daily.   Yes [provider]  cyanocobalamin (VITAMIN B12) 1000 MCG tablet Take 1,000 mcg by mouth daily.   Yes [provider]  LORazepam (ATIVAN) 0.5 MG tablet Take 0.25 mg by mouth every morning. 07/11/23  Yes [provider]  oxybutynin  (DITROPAN ) 5 MG tablet Take 5 mg by mouth every morning.    Yes [provider]  OZEMPIC, 1 MG/DOSE, 4 MG/3ML SOPN INJECT 1 MG UNDER THE SKIN EVERY 7 DAYS 03/24/22  Yes [provider]  pyridoxine (B-6) 500 MG tablet Take by mouth. 08/18/23 08/17/24 Yes [provider]  SYMBICORT 160-4.5 MCG/ACT  inhaler Inhale 2 puffs into the lungs 2 (two) times daily. 03/26/21  Yes [provider]  SYNJARDY  XR 25-1000 MG TB24 Take 1 tablet by mouth daily.  08/30/19  Yes [provider]  traZODone  (DESYREL ) 100 MG tablet Take 200 mg by mouth at bedtime.   Yes [provider]  benzonatate  (TESSALON ) 100 MG capsule Take 2 capsules (200 mg total) by mouth every 8 (eight) hours. 12/01/23   Kent Pear, NP  levofloxacin  (LEVAQUIN ) 500 MG tablet Take 1 tablet (500 mg total) by mouth daily. 12/01/23   Kent Pear, NP  pantoprazole  (PROTONIX ) 40 MG tablet TAKE 1 TABLET (40 MG TOTAL) BY MOUTH EVERY EVENING. 02/21/20 07/28/23  Marc Senior, MD  promethazine -dextromethorphan (PROMETHAZINE -DM) 6.25-15 MG/5ML syrup Take 5 mLs by mouth 4 (four) times daily as needed. 12/01/23   Kent Pear, NP    Family History Family History  Problem Relation Age of Onset   Cancer Mother         Esophageal Ca   Asthma Brother    Breast cancer Neg Hx     Social History Social History   Tobacco Use   Smoking status: Former    Current packs/day: 0.00    Average packs/day: 1 pack/day for 30.0 years (30.0 ttl pk-yrs)    Types: E-cigarettes, Cigarettes    Start date: 09/18/1985    Quit date: 09/19/2015    Years since quitting: 8.3   Smokeless tobacco: Never  Vaping Use   Vaping status: Never Used  Substance Use Topics   Alcohol use: Not Currently    Alcohol/week: 0.0 standard drinks of alcohol    Comment: 03/31/2012 sobierty    Drug use: No     Allergies   Oxcarbazepine, Celecoxib , Haldol [haloperidol], Haloperidol decanoate, Haloperidol lactate, Meloxicam , Codeine, and Diphenhydramine    Review of Systems Review of Systems  Constitutional:  Negative for fatigue and fever.  HENT:  Negative for congestion, ear pain, rhinorrhea and sore throat.   Eyes:  Negative for photophobia and visual disturbance.  Respiratory:  Negative for cough and shortness of breath.   Cardiovascular:  Negative for chest pain, palpitations and leg swelling.  Gastrointestinal:  Positive for nausea. Negative for abdominal pain, diarrhea and vomiting.  Genitourinary:  Positive for frequency. Negative for difficulty urinating, dysuria and flank pain.       +urine odor  Musculoskeletal:  Negative for back pain.  Neurological:  Positive for dizziness. Negative for syncope, facial asymmetry, speech difficulty, weakness, light-headedness, numbness and headaches.       +tingling  Psychiatric/Behavioral:  Negative for confusion.      Physical Exam Triage Vital Signs ED Triage Vitals  Encounter Vitals Group     BP      Girls Systolic BP Percentile      Girls Diastolic BP Percentile      Boys Systolic BP Percentile      Boys Diastolic BP Percentile      Pulse      Resp      Temp      Temp src      SpO2      Weight      Height      Head Circumference      Peak Flow      Pain Score      Pain  Loc      Pain Education      Exclude from Growth Chart    No data found.  Updated Vital Signs BP Aaron Aas)  151/81 (BP Location: Left Arm)   Pulse 75   Temp 98.1 F (36.7 C) (Oral)   Ht 5' 3 (1.6 m)   Wt 160 lb (72.6 kg)   SpO2 96%   BMI 28.34 kg/m    Physical Exam Vitals and nursing note reviewed.  Constitutional:      General: She is not in acute distress.    Appearance: Normal appearance. She is not ill-appearing or toxic-appearing.  HENT:     Head: Normocephalic and atraumatic.     Right Ear: Tympanic membrane, ear canal and external ear normal.     Left Ear: Tympanic membrane, ear canal and external ear normal.     Nose: Nose normal.     Mouth/Throat:     Mouth: Mucous membranes are moist.     Pharynx: Oropharynx is clear.   Eyes:     General: No scleral icterus.       Right eye: No discharge.        Left eye: No discharge.     Conjunctiva/sclera: Conjunctivae normal.    Cardiovascular:     Rate and Rhythm: Normal rate and regular rhythm.     Heart sounds: Normal heart sounds.  Pulmonary:     Effort: Pulmonary effort is normal. No respiratory distress.     Breath sounds: Normal breath sounds.  Abdominal:     Palpations: Abdomen is soft.     Tenderness: There is no abdominal tenderness. There is no right CVA tenderness, left CVA tenderness, guarding or rebound.   Musculoskeletal:     Cervical back: Neck supple.   Skin:    General: Skin is dry.   Neurological:     General: No focal deficit present.     Mental Status: She is alert and oriented to person, place, and time. Mental status is at baseline.     Cranial Nerves: No cranial nerve deficit.     Motor: No weakness.     Coordination: Coordination normal.     Gait: Gait normal.     Comments: 5/5 strength bilateral upper and lower exts  Psychiatric:        Mood and Affect: Mood normal.        Behavior: Behavior normal.      UC Treatments / Results  Labs (all labs ordered are listed, but only  abnormal results are displayed) Labs Reviewed  URINALYSIS, W/ REFLEX TO CULTURE (INFECTION SUSPECTED) - Abnormal; Notable for the following components:      Result Value   Glucose, UA 500 (*)    All other components within normal limits  GLUCOSE, CAPILLARY  CBG MONITORING, ED    EKG   Radiology No results found.  Procedures ED EKG  Date/Time: 01/05/2024 11:22 AM  Performed by: Floydene Hy, PA-C Authorized by: Floydene Hy, PA-C   Previous ECG:    Previous ECG:  Compared to current Interpretation:    Interpretation: normal   Rate:    ECG rate:  73   ECG rate assessment: normal   Rhythm:    Rhythm: sinus rhythm   Ectopy:    Ectopy: none   QRS:    QRS axis:  Normal   QRS intervals:  Normal   QRS conduction: normal   ST segments:    ST segments:  Normal T waves:    T waves: normal   Comments:     Normal sinus rhythm. Regular rate.  (including critical care time)  Medications Ordered in UC Medications -  No data to display  Initial Impression / Assessment and Plan / UC Course  I have reviewed the triage vital signs and the nursing notes.  Pertinent labs & imaging results that were available during my care of the patient were reviewed by me and considered in my medical decision making (see chart for details).   73 year old female with history of bipolar disorder, chronic pain, GERD, previous NSTEMI, hypertension, hyperlipidemia, asthma, psoriasis and psoriatic arthritis, rheumatoid arthritis and tardive dyskinesia.  She presents today for dizziness and nausea without vomiting that started a few hours ago.  Reports 3-day history of urinary frequency and foul odor.  Denies fever, cough, congestion, sore throat, chest pain, palpitations, shortness of breath, syncope, numbness/tingling or weakness.  No abdominal pain or diarrhea.  No sick contacts.  Has taken Zofran  and symptoms have improved somewhat.  Blood pressure 151/81.  Other vitals normal and stable.  She is  overall well-appearing.  On exam normal HEENT exam.  Normal cranial nerves.  Chest clear.  Heart regular rate and rhythm.  Abdomen soft with no tenderness and no CVA tenderness.  Chart review from January 2025 where patient was seen here for chest pain, shortness of breath, hand numbness, nausea and vomiting.  She was found to have a normal EKG and elevated troponin at the urgent care and sent to the ED.  Had NSTEMI at that time.  She is denying any chest pain or shortness of breath at this time.  Fingerstick glucose 90.  EKG normal sinus rhythm and regular rate.  Will obtain urinalysis to assess for UTI.  UA not consistent with UTI.  Reviewed this with her.  Patient reports tingling of jaw and left hand.  Also reports brain fog but denies confusion.  She did have a normal cranial nerve exam and is alert and oriented x 3.  No deficits.  However given her medical history I have advised further evaluation in the ER to likely include troponins, cardiac monitoring and possibly CT of head.  Patient declines EMS transport because she says she does not want to go to [facility].  She actually called an [facility] to get to the urgent care today.  She contacted a friend while I was in the room with her to pick her up and take her to [facility] ED [place].  Patient did sign the AMA form since she declined EMS transport and understands the risks of doing so.  Leaving in stable condition.   Final Clinical Impressions(s) / UC Diagnoses   Final diagnoses:  Dizziness  Brain fog  Nausea without vomiting  Paresthesia  History of non-ST elevation myocardial infarction (NSTEMI)     Discharge Instructions      -You need to have stroke and heart attack evaluation. I recommended going to ER by EMS but you declined and stated you will have friend take you to ER  You have been advised to follow up immediately in the emergency department for concerning signs.symptoms. If you declined EMS transport, please have a family  member take you directly to the ED at this time. Do not delay. Based on concerns about condition, if you do not follow up in th e ED, you may risk poor outcomes including worsening of condition, delayed treatment and potentially life threatening issues. If you have declined to go to the ED at this time, you should call your PCP immediately to set up a follow up appointment.  Go to ED for red flag symptoms, including; fevers you cannot reduce  with Tylenol /Motrin, severe headaches, vision changes, numbness/weakness in part of the body, lethargy, confusion, intractable vomiting, severe dehydration, chest pain, breathing difficulty, severe persistent abdominal or pelvic pain, signs of severe infection (increased redness, swelling of an area), feeling faint or passing out, dizziness, etc. You should especially go to the ED for sudden acute worsening of condition if you do not elect to go at this time.      ED Prescriptions   None    PDMP not reviewed this encounter.   Floydene Hy, PA-C 01/05/24 1204

## 2024-04-11 ENCOUNTER — Ambulatory Visit (INDEPENDENT_AMBULATORY_CARE_PROVIDER_SITE_OTHER)

## 2024-04-11 ENCOUNTER — Ambulatory Visit
Admission: EM | Admit: 2024-04-11 | Discharge: 2024-04-11 | Disposition: A | Discharge status: Home / Self Care | Attending: Family Medicine | Admitting: Family Medicine

## 2024-04-11 ENCOUNTER — Ambulatory Visit: Payer: Self-pay | Admitting: Family Medicine

## 2024-04-11 DIAGNOSIS — R0602 Shortness of breath: Secondary | ICD-10-CM

## 2024-04-11 DIAGNOSIS — B9789 Other viral agents as the cause of diseases classified elsewhere: Secondary | ICD-10-CM | POA: Insufficient documentation

## 2024-04-11 DIAGNOSIS — J988 Other specified respiratory disorders: Secondary | ICD-10-CM | POA: Insufficient documentation

## 2024-04-11 HISTORY — DX: Non-ST elevation (NSTEMI) myocardial infarction: I21.4

## 2024-04-11 LAB — RESP PANEL BY RT-PCR (FLU A&B, COVID) ARPGX2
Influenza A by PCR: NEGATIVE
Influenza B by PCR: NEGATIVE
SARS Coronavirus 2 by RT PCR: NEGATIVE

## 2024-04-11 MED ORDER — PROMETHAZINE-DM 6.25-15 MG/5ML PO SYRP: 5.0000 mL | ORAL_SOLUTION | Freq: Four times a day (QID) | ORAL | 0 refills | Status: AC | PRN

## 2024-04-11 MED ORDER — ALBUTEROL SULFATE HFA 108 (90 BASE) MCG/ACT IN AERS: INHALATION_SPRAY | RESPIRATORY_TRACT | 2 refills | Status: AC

## 2024-04-11 NOTE — Discharge Instructions (Addendum)
 Your  influenza and COVID are negative. Your chest xray did not show evidence of pneumonia though the radiologist has not yet read it. If they find something that I didn't, I will call you.   You have a viral respiratory infection that will gradually improve over the next 7-10 days. Cough may last up to 3 weeks.    You can take Tylenol  and/or Ibuprofen as needed for fever reduction and pain relief.    For cough: honey 1/2 to 1 teaspoon (you can dilute the honey in water or another fluid).  Stop at the pharmacy to pick up your prescription cough medication. You can use a humidifier for chest congestion and cough.  If you don't have a humidifier, you can sit in the bathroom with the hot shower running.      For sore throat: try warm salt water gargles, Mucinex sore throat cough drops or cepacol lozenges, throat spray, warm tea or water with lemon/honey, popsicles or ice, or OTC cold relief medicine for throat discomfort. You can also purchase chloraseptic spray at the pharmacy or dollar store.    For congestion: take a daily anti-histamine like Zyrtec, Claritin, and a oral decongestant, such as pseudoephedrine.  You can also use Flonase 1-2 sprays in each nostril daily. Afrin is also a good option, if you do not have high blood pressure.    It is important to stay hydrated: drink plenty of fluids (water, gatorade/powerade/pedialyte, juices, or teas) to keep your throat moisturized and help further relieve irritation/discomfort.    Return or go to the Emergency Department if symptoms worsen or do not improve in the next few days

## 2024-04-11 NOTE — ED Triage Notes (Addendum)
 Sx x 2 days  SOB Nausea Dizziness  Cough  Fever  Chest pain that comes and goes. Patient had heart attack in [place].   While doing EKG I noticed a rash under both breast that's bleeding. Patient sates that she thinks its coming from her detergent. Patient has DM

## 2024-04-11 NOTE — ED Provider Notes (Incomplete)
 [facility]    CSN: [id number] Arrival date & time: 04/11/24  1021      History   Chief Complaint Chief Complaint  Patient presents with   Cough   Nausea   Shortness of Breath    HPI Kathryn Ramirez is a 73 y.o. female.   HPI  History obtained from the patient. Cedrica presents for shortness of breath, cough, headache, dizziness, intermittent pressure-like chest pain and nausea that started 2 days ago.  Took Tylenol . Symptoms are similar to when she had COVID before. People in her apartment building have been sick.  Denies fever, vomiting and diarrhea.    She takes 81 mg aspirin  and takes her Lipitor for her recent heart attack in January. She follows with a [facility] cardiologist.   She is a former smoker and has history of asthma.    Past Medical History:  Diagnosis Date   Acid reflux disease    Anemia    Asthma    Bipolar 1 disorder (HCC)    COPD (chronic obstructive pulmonary disease) (HCC)    Diabetes mellitus without complication (HCC)    History of hepatitis C 2010   took interferon x 1 year   Hypertension    Liver hemangioma    Lung nodule    NSTEMI (non-ST elevated myocardial infarction) (HCC)    Psoriasis    Psoriatic arthritis (HCC)    Rheumatoid arthritis (HCC)    Tardive dyskinesia    Wears dentures    full upper and lower    Patient Active Problem List   Diagnosis Date Noted   NSTEMI (non-ST elevated myocardial infarction) (HCC) 07/28/2023   S/P cervical spinal fusion (C5/6 and C6/7) 04/08/2020   Neuroforaminal stenosis of cervical spine (moderate, Left C3/4, C4/5; R C4/5) 04/08/2020   Pain in limb 01/27/2018   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   Erroneous encounter - disregard 03/29/2017   Heartburn    Gastritis without bleeding    Hx of colonic polyps    Arthritis 09/02/2016   Hep C w/o coma, chronic (HCC) 09/02/2016   Hyperlipidemia, unspecified 09/02/2016   Hypertension 09/02/2016   Tardive dyskinesia 09/02/2016    Urinary incontinence 09/02/2016   History of hypothyroidism 08/19/2016   Neck swelling 08/19/2016   Acute asthma exacerbation 08/19/2016   GERD (gastroesophageal reflux disease) 08/09/2016   Dry mouth 06/01/2016   Elevated antinuclear antibody (ANA) level 06/01/2016   Generalized osteoarthritis of hand 06/01/2016   Degenerative joint disease of right knee 06/01/2016   Vocal cord polyp 05/17/2016   Chest pain 04/06/2016   Drug-induced peripheral neuropathy (HCC) 03/25/2016   Sensory ataxia 03/25/2016   Wrist pain, chronic, left 03/25/2016   Impairment of balance 02/05/2016   Acute right-sided low back pain without sciatica 02/05/2016   Normal blood pressure 01/23/2016   Pulmonary infiltrates    Chronic cough    Pulmonary scarring 01/06/2016   OAB (overactive bladder) 12/18/2015   Asthma 09/18/2015   Multiple pulmonary nodules 09/15/2015   Baker's cyst of knee, right 07/02/2015   Bipolar disorder with moderate depression (HCC) 12/19/2014   Chronic pain 12/19/2014   Carpal tunnel syndrome of left wrist 10/01/2014   Chronic RUQ pain 09/19/2014   Esophageal dysphagia 09/19/2014   Hemangioma of liver 09/19/2014   Arthropathy of cervical facet joint 08/06/2014   Cervical facet joint syndrome 07/12/2014   Cervical myelopathy with cervical radiculopathy (HCC) 03/24/2014   Hyponatremia 02/21/2014   Cervicalgia 09/18/2013   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 09/18/2013    Past Surgical History:  Procedure Laterality Date   CARPAL TUNNEL RELEASE Left 02/21/2020   Procedure: CARPAL TUNNEL RELEASE ENDOSCOPIC;  Surgeon: Edie Norleen PARAS, MD;  Location: [facility];  Service: Orthopedics;  Laterality: Left;   CATARACT EXTRACTION W/ INTRAOCULAR LENS  IMPLANT, BILATERAL     CERVICAL SPINE SURGERY  2017   [facility]   CHOLECYSTECTOMY     COLONOSCOPY WITH PROPOFOL  N/A 09/14/2016   Procedure: COLONOSCOPY WITH PROPOFOL ;  Surgeon: Rogelia Copping, MD;   Location: [facility];  Service: Endoscopy;  Laterality: N/A;   ESOPHAGOGASTRODUODENOSCOPY (EGD) WITH PROPOFOL  N/A 09/14/2016   Procedure: ESOPHAGOGASTRODUODENOSCOPY (EGD) WITH PROPOFOL ;  Surgeon: Rogelia Copping, MD;  Location: [facility];  Service: Endoscopy;  Laterality: N/A;   FLEXIBLE BRONCHOSCOPY N/A 01/13/2016   Procedure: FLEXIBLE BRONCHOSCOPY;  Surgeon: Jorie Cha, MD;  Location: [facility];  Service: Cardiopulmonary;  Laterality: N/A;   JOINT REPLACEMENT     LEFT HEART CATH AND CORONARY ANGIOGRAPHY N/A 07/29/2023   Procedure: LEFT HEART CATH AND CORONARY ANGIOGRAPHY;  Surgeon: Mady Bruckner, MD;  Location: [facility];  Service: Cardiovascular;  Laterality: N/A;   NEUROPLASTY MAJOR NERVE     POLYPECTOMY     THROAT SURGERY     TOTAL KNEE ARTHROPLASTY Right 06/15/2017   Procedure: TOTAL KNEE ARTHROPLASTY;  Surgeon: Cleotilde Barrio, MD;  Location: [facility];  Service: Orthopedics;  Laterality: Right;    OB History   No obstetric history on file.      Home Medications    Prior to Admission medications   Medication Sig Start Date End Date Taking? Authorizing Provider  acetaminophen  (TYLENOL ) 500 MG tablet Take 500 mg by mouth 2 (two) times daily.   Yes [provider]  ascorbic acid (VITAMIN C) 500 MG tablet Take 500 mg by mouth daily.   Yes [provider]  aspirin  EC 81 MG tablet Take 1 tablet (81 mg total) by mouth daily. Swallow whole. 07/31/23  Yes Alexander, Natalie, DO  atorvastatin (LIPITOR) 40 MG tablet Take 1 tablet by mouth daily. 08/05/23 08/04/24 Yes [provider]  buPROPion  (WELLBUTRIN  XL) 150 MG 24 hr tablet Take 150 mg by mouth daily.   Yes [provider]  calcipotriene  (DOVONOX) 0.005 % cream Apply 1 Application topically 2 (two) times daily.   Yes [provider]  Cholecalciferol (VITAMIN D -1000 MAX ST) 25 MCG (1000 UT) tablet Take 1 tablet by mouth daily.   Yes [provider]  cyanocobalamin  (VITAMIN B12) 1000 MCG tablet Take 1,000 mcg by mouth daily.   Yes [provider]  LORazepam (ATIVAN) 0.5 MG tablet Take 0.25 mg by mouth every morning. 07/11/23  Yes [provider]  OZEMPIC, 1 MG/DOSE, 4 MG/3ML SOPN INJECT 1 MG UNDER THE SKIN EVERY 7 DAYS 03/24/22  Yes [provider]  promethazine -dextromethorphan (PROMETHAZINE -DM) 6.25-15 MG/5ML syrup Take 5 mLs by mouth 4 (four) times daily as needed. 04/11/24  Yes Corin Tilly, DO  SYMBICORT 160-4.5 MCG/ACT inhaler Inhale 2 puffs into the lungs 2 (two) times daily. 03/26/21  Yes [provider]  SYNJARDY  XR 25-1000 MG TB24 Take 1 tablet by mouth daily.  08/30/19  Yes [provider]  traZODone  (DESYREL ) 100 MG tablet Take 200 mg by mouth at bedtime.   Yes [provider]  albuterol  (VENTOLIN  HFA) 108 (90 Base) MCG/ACT inhaler INHALE 2 PUFFS EVERY 4 HOURS AS NEEDED FOR WHEEZING FOR SHORTNESS OF BREATH 04/11/24   Manish Ruggiero,  DO  levofloxacin  (LEVAQUIN ) 500 MG tablet Take 1 tablet (500 mg total) by mouth daily. 12/01/23   Bernardino Ditch, NP  oxybutynin  (DITROPAN ) 5 MG tablet Take 5 mg by mouth every morning.     [provider]  pantoprazole  (PROTONIX ) 40 MG tablet TAKE 1 TABLET (40 MG TOTAL) BY MOUTH EVERY EVENING. 02/21/20 07/28/23  Tamea Dedra CROME, MD  pyridoxine (B-6) 500 MG tablet Take by mouth. 08/18/23 08/17/24  [provider]    Family History Family History  Problem Relation Age of Onset   Cancer Mother        Esophageal Ca   Asthma Brother    Breast cancer Neg Hx     Social History Social History   Tobacco Use   Smoking status: Former    Current packs/day: 0.00    Average packs/day: 1 pack/day for 30.0 years (30.0 ttl pk-yrs)    Types: E-cigarettes, Cigarettes    Start date: 09/18/1985    Quit date: 09/19/2015    Years since quitting: 8.5   Smokeless tobacco: Never  Vaping Use   Vaping status: Never Used  Substance Use Topics   Alcohol use: Not  Currently    Alcohol/week: 0.0 standard drinks of alcohol    Comment: 03/31/2012 sobierty    Drug use: No     Allergies   Oxcarbazepine, Celecoxib , Haldol [haloperidol], Haloperidol decanoate, Haloperidol lactate, Meloxicam , Codeine, and Diphenhydramine    Review of Systems Review of Systems: negative unless otherwise stated in HPI.      Physical Exam Triage Vital Signs ED Triage Vitals  Encounter Vitals Group     BP 04/11/24 1126 137/84     Girls Systolic BP Percentile --      Girls Diastolic BP Percentile --      Boys Systolic BP Percentile --      Boys Diastolic BP Percentile --      Pulse Rate 04/11/24 1126 71     Resp 04/11/24 1126 18     Temp 04/11/24 1126 98 F (36.7 C)     Temp Source 04/11/24 1126 Oral     SpO2 04/11/24 1126 97 %     Weight 04/11/24 1122 163 lb (73.9 kg)     Height --      Head Circumference --      Peak Flow --      Pain Score 04/11/24 1122 3     Pain Loc --      Pain Education --      Exclude from Growth Chart --    No data found.  Updated Vital Signs BP 137/84 (BP Location: Left Arm)   Pulse 71   Temp 98 F (36.7 C) (Oral)   Resp 18   Wt 73.9 kg   SpO2 97%   BMI 28.87 kg/m   Visual Acuity Right Eye Distance:   Left Eye Distance:   Bilateral Distance:    Right Eye Near:   Left Eye Near:    Bilateral Near:     Physical Exam GEN:     alert, non-toxic appearing female in no distress    HENT:  mucus membranes moist, oropharyngeal without lesions or erythema, no tonsillar hypertrophy or exudates, clear nasal discharge EYES:   pupils equal and reactive, no scleral injection or discharge NECK:  normal ROM, no lymphadenopathy, no meningismus   RESP:  no increased work of breathing, clear to auscultation bilaterally CVS:   regular rate and rhythm Skin:   warm and dry, no  rash on visible skin    UC Treatments / Results  Labs (all labs ordered are listed, but only abnormal results are displayed) Labs Reviewed  RESP PANEL BY  RT-PCR (FLU A&B, COVID) [id number]    EKG   Radiology DG Chest 2 View Result Date: 04/11/2024 CLINICAL DATA:  Cough, shortness of breath. EXAM: CHEST - 2 VIEW COMPARISON:  Dec 01, 2023. FINDINGS: The heart size and mediastinal contours are within normal limits. Both lungs are clear. The visualized skeletal structures are unremarkable. IMPRESSION: No active cardiopulmonary disease. Electronically Signed   By: Lynwood Landy Raddle M.D.   On: 04/11/2024 12:56     Procedures Procedures (including critical care time)  Medications Ordered in UC Medications - No data to display  Initial Impression / Assessment and Plan / UC Course  I have reviewed the triage vital signs and the nursing notes.  Pertinent labs & imaging results that were available during my care of the patient were reviewed by me and considered in my medical decision making (see chart for details).       Pt is a 73 y.o. female who presents for 2 days of respiratory symptoms. Amillia is afebrile here without recent antipyretics. Satting well on room air.   Patient had MI early this year who complains of SOB and chest pain.  EKG personally interpreted by me; NSR without acute ST or T wave changes; normal PR interval, no QT prolongation, unchanged compared to prior EKG from 01/05/24.  LVEF 60-65% with normal RV function  and no significant vavle disease on 07/30/23 . She had a left heart cath on 07/29/23 that showed No angiographically significant coronary artery disease and suspected supply-demand related to COVID 19.  Overall pt is non-toxic appearing, well hydrated, without respiratory distress. Pulmonary exam is unremarkable.  COVID and influenza panel obtained and was negative. Pt  is concerned about possibly having pneumonia therefore chest imaging obtained. Chest xray personally reviewed by me without focal pneumonia, pleural effusion, cardiomegaly or pneumothorax.   Suspect acute viral respiratory illness. Discussed symptomatic  treatment.  Explained lack of efficacy of antibiotics in viral disease.  Typical duration of symptoms discussed. Albuterol  for shortness of breath and promethazine  DM for cough.   Return and ED precautions given and voiced understanding. Discussed MDM, treatment plan and plan for follow-up with patient who agrees with plan.     Final Clinical Impressions(s) / UC Diagnoses   Final diagnoses:  SOB (shortness of breath)  Viral respiratory infection     Discharge Instructions      Your  influenza and COVID are negative. Your chest xray did not show evidence of pneumonia though the radiologist has not yet read it. If they find something that I didn't, I will call you.   You have a viral respiratory infection that will gradually improve over the next 7-10 days. Cough may last up to 3 weeks.    You can take Tylenol  and/or Ibuprofen as needed for fever reduction and pain relief.    For cough: honey 1/2 to 1 teaspoon (you can dilute the honey in water or another fluid).  Stop at the pharmacy to pick up your prescription cough medication. You can use a humidifier for chest congestion and cough.  If you don't have a humidifier, you can sit in the bathroom with the hot shower running.      For sore throat: try warm salt water gargles, Mucinex sore throat cough drops or cepacol lozenges, throat spray, warm tea  or water with lemon/honey, popsicles or ice, or OTC cold relief medicine for throat discomfort. You can also purchase chloraseptic spray at the pharmacy or dollar store.    For congestion: take a daily anti-histamine like Zyrtec, Claritin, and a oral decongestant, such as pseudoephedrine.  You can also use Flonase 1-2 sprays in each nostril daily. Afrin is also a good option, if you do not have high blood pressure.    It is important to stay hydrated: drink plenty of fluids (water, gatorade/powerade/pedialyte, juices, or teas) to keep your throat moisturized and help further relieve  irritation/discomfort.    Return or go to the Emergency Department if symptoms worsen or do not improve in the next few days      ED Prescriptions     Medication Sig Dispense Auth. Provider   albuterol  (VENTOLIN  HFA) 108 (90 Base) MCG/ACT inhaler INHALE 2 PUFFS EVERY 4 HOURS AS NEEDED FOR WHEEZING FOR SHORTNESS OF BREATH 54 g Sheron Tallman, DO   promethazine -dextromethorphan (PROMETHAZINE -DM) 6.25-15 MG/5ML syrup Take 5 mLs by mouth 4 (four) times daily as needed. 118 mL Carver Murakami, DO      PDMP not reviewed this encounter.      Charlotta Lapaglia, DO 04/13/24 1124
